# Patient Record
Sex: Female | Born: 1945 | Race: White | Hispanic: No | Marital: Married | Smoking: Never smoker
Health system: Southern US, Community
[De-identification: ages and names within clinical notes are randomized; demographics above are authoritative.]

## PROBLEM LIST (undated history)

## (undated) DIAGNOSIS — I495 Sick sinus syndrome: Secondary | ICD-10-CM

## (undated) DIAGNOSIS — Z9889 Other specified postprocedural states: Secondary | ICD-10-CM

## (undated) DIAGNOSIS — I2510 Atherosclerotic heart disease of native coronary artery without angina pectoris: Secondary | ICD-10-CM

## (undated) DIAGNOSIS — L509 Urticaria, unspecified: Secondary | ICD-10-CM

## (undated) DIAGNOSIS — E042 Nontoxic multinodular goiter: Secondary | ICD-10-CM

## (undated) DIAGNOSIS — I10 Essential (primary) hypertension: Secondary | ICD-10-CM

## (undated) DIAGNOSIS — E119 Type 2 diabetes mellitus without complications: Secondary | ICD-10-CM

## (undated) DIAGNOSIS — I4891 Unspecified atrial fibrillation: Secondary | ICD-10-CM

## (undated) DIAGNOSIS — I4892 Unspecified atrial flutter: Secondary | ICD-10-CM

## (undated) DIAGNOSIS — E785 Hyperlipidemia, unspecified: Secondary | ICD-10-CM

## (undated) DIAGNOSIS — R112 Nausea with vomiting, unspecified: Secondary | ICD-10-CM

## (undated) HISTORY — PX: ROTATOR CUFF REPAIR: SHX139

## (undated) HISTORY — DX: Atherosclerotic heart disease of native coronary artery without angina pectoris: I25.10

## (undated) HISTORY — PX: PACEMAKER PLACEMENT: SHX43

## (undated) HISTORY — DX: Type 2 diabetes mellitus without complications: E11.9

## (undated) HISTORY — DX: Unspecified atrial flutter: I48.92

## (undated) HISTORY — DX: Nontoxic multinodular goiter: E04.2

## (undated) HISTORY — DX: Unspecified atrial fibrillation: I48.91

## (undated) HISTORY — DX: Sick sinus syndrome: I49.5

## (undated) HISTORY — DX: Urticaria, unspecified: L50.9

## (undated) HISTORY — PX: VENTRAL HERNIA REPAIR: SHX424

## (undated) HISTORY — PX: BREAST LUMPECTOMY: SHX2

## (undated) HISTORY — DX: Essential (primary) hypertension: I10

## (undated) HISTORY — DX: Hyperlipidemia, unspecified: E78.5

## (undated) HISTORY — PX: VESICOVAGINAL FISTULA CLOSURE W/ TAH: SUR271

---

## 2000-09-16 ENCOUNTER — Encounter: Payer: Self-pay | Admitting: Family Medicine

## 2000-09-16 ENCOUNTER — Ambulatory Visit (HOSPITAL_COMMUNITY): Admission: RE | Admit: 2000-09-16 | Discharge: 2000-09-16 | Payer: Self-pay | Admitting: Family Medicine

## 2003-11-16 ENCOUNTER — Ambulatory Visit (HOSPITAL_COMMUNITY): Admission: RE | Admit: 2003-11-16 | Discharge: 2003-11-16 | Payer: Self-pay | Admitting: Family Medicine

## 2004-04-28 ENCOUNTER — Emergency Department (HOSPITAL_COMMUNITY): Admission: EM | Admit: 2004-04-28 | Discharge: 2004-04-28 | Payer: Self-pay | Admitting: Emergency Medicine

## 2004-05-09 ENCOUNTER — Ambulatory Visit (HOSPITAL_COMMUNITY): Admission: RE | Admit: 2004-05-09 | Discharge: 2004-05-09 | Payer: Self-pay | Admitting: Orthopaedic Surgery

## 2004-08-15 ENCOUNTER — Ambulatory Visit (HOSPITAL_COMMUNITY): Admission: RE | Admit: 2004-08-15 | Discharge: 2004-08-15 | Payer: Self-pay | Admitting: Orthopaedic Surgery

## 2004-11-26 ENCOUNTER — Ambulatory Visit: Payer: Self-pay | Admitting: Cardiology

## 2004-12-01 ENCOUNTER — Ambulatory Visit: Payer: Self-pay | Admitting: Cardiology

## 2004-12-09 ENCOUNTER — Ambulatory Visit: Payer: Self-pay | Admitting: Cardiology

## 2004-12-10 ENCOUNTER — Ambulatory Visit: Payer: Self-pay | Admitting: Cardiology

## 2004-12-12 ENCOUNTER — Ambulatory Visit: Payer: Self-pay | Admitting: Internal Medicine

## 2004-12-17 ENCOUNTER — Ambulatory Visit: Payer: Self-pay | Admitting: Cardiology

## 2004-12-24 ENCOUNTER — Ambulatory Visit: Payer: Self-pay | Admitting: Cardiology

## 2005-01-01 ENCOUNTER — Ambulatory Visit: Payer: Self-pay | Admitting: Cardiology

## 2005-01-02 ENCOUNTER — Ambulatory Visit: Payer: Self-pay | Admitting: Cardiology

## 2005-01-07 ENCOUNTER — Ambulatory Visit (HOSPITAL_COMMUNITY): Admission: RE | Admit: 2005-01-07 | Discharge: 2005-01-07 | Payer: Self-pay | Admitting: Family Medicine

## 2005-01-12 ENCOUNTER — Ambulatory Visit: Payer: Self-pay | Admitting: Internal Medicine

## 2005-01-13 ENCOUNTER — Inpatient Hospital Stay (HOSPITAL_COMMUNITY): Admission: AD | Admit: 2005-01-13 | Discharge: 2005-01-15 | Payer: Self-pay | Admitting: Internal Medicine

## 2005-01-14 ENCOUNTER — Ambulatory Visit: Payer: Self-pay | Admitting: Internal Medicine

## 2005-01-22 ENCOUNTER — Ambulatory Visit: Payer: Self-pay | Admitting: Cardiology

## 2005-01-23 ENCOUNTER — Ambulatory Visit: Payer: Self-pay | Admitting: Cardiology

## 2005-01-26 ENCOUNTER — Ambulatory Visit: Payer: Self-pay | Admitting: Internal Medicine

## 2005-02-19 ENCOUNTER — Ambulatory Visit: Payer: Self-pay | Admitting: Cardiology

## 2005-02-26 ENCOUNTER — Ambulatory Visit: Payer: Self-pay | Admitting: Cardiology

## 2005-03-05 ENCOUNTER — Ambulatory Visit: Payer: Self-pay | Admitting: Cardiology

## 2005-03-12 ENCOUNTER — Ambulatory Visit: Payer: Self-pay | Admitting: Cardiology

## 2005-03-24 ENCOUNTER — Ambulatory Visit: Payer: Self-pay | Admitting: Cardiology

## 2005-04-06 ENCOUNTER — Ambulatory Visit: Payer: Self-pay | Admitting: Cardiology

## 2005-05-04 ENCOUNTER — Ambulatory Visit: Payer: Self-pay | Admitting: Cardiology

## 2005-06-01 ENCOUNTER — Ambulatory Visit: Payer: Self-pay | Admitting: Cardiology

## 2005-06-08 ENCOUNTER — Ambulatory Visit: Payer: Self-pay | Admitting: Cardiology

## 2005-06-16 ENCOUNTER — Ambulatory Visit: Payer: Self-pay | Admitting: Cardiology

## 2005-07-07 ENCOUNTER — Ambulatory Visit: Payer: Self-pay | Admitting: Cardiology

## 2005-07-23 ENCOUNTER — Ambulatory Visit: Payer: Self-pay | Admitting: Cardiology

## 2005-08-04 ENCOUNTER — Ambulatory Visit: Payer: Self-pay | Admitting: Cardiology

## 2005-08-11 ENCOUNTER — Ambulatory Visit: Payer: Self-pay | Admitting: Cardiology

## 2005-08-25 ENCOUNTER — Ambulatory Visit: Payer: Self-pay | Admitting: Cardiology

## 2005-09-23 ENCOUNTER — Ambulatory Visit: Payer: Self-pay | Admitting: Cardiology

## 2005-10-21 ENCOUNTER — Ambulatory Visit: Payer: Self-pay | Admitting: Cardiology

## 2005-10-27 ENCOUNTER — Ambulatory Visit: Payer: Self-pay | Admitting: Cardiology

## 2005-11-03 ENCOUNTER — Ambulatory Visit: Payer: Self-pay | Admitting: Cardiology

## 2005-12-08 ENCOUNTER — Ambulatory Visit: Payer: Self-pay | Admitting: Cardiology

## 2006-01-05 ENCOUNTER — Ambulatory Visit: Payer: Self-pay | Admitting: Cardiology

## 2006-01-06 ENCOUNTER — Ambulatory Visit: Payer: Self-pay | Admitting: Cardiology

## 2006-01-13 ENCOUNTER — Ambulatory Visit: Payer: Self-pay | Admitting: Cardiology

## 2006-02-03 ENCOUNTER — Ambulatory Visit: Payer: Self-pay | Admitting: Cardiology

## 2006-02-12 ENCOUNTER — Ambulatory Visit: Payer: Self-pay | Admitting: Cardiology

## 2006-02-22 ENCOUNTER — Ambulatory Visit: Payer: Self-pay | Admitting: Cardiology

## 2006-03-12 ENCOUNTER — Ambulatory Visit: Payer: Self-pay | Admitting: Cardiology

## 2006-03-22 ENCOUNTER — Ambulatory Visit: Payer: Self-pay | Admitting: Cardiology

## 2006-04-05 ENCOUNTER — Ambulatory Visit: Payer: Self-pay | Admitting: Cardiology

## 2006-04-15 ENCOUNTER — Ambulatory Visit (HOSPITAL_COMMUNITY): Admission: RE | Admit: 2006-04-15 | Discharge: 2006-04-15 | Payer: Self-pay | Admitting: Family Medicine

## 2006-04-19 ENCOUNTER — Ambulatory Visit: Payer: Self-pay | Admitting: Cardiology

## 2006-05-14 ENCOUNTER — Ambulatory Visit: Payer: Self-pay | Admitting: Cardiology

## 2006-06-11 ENCOUNTER — Ambulatory Visit: Payer: Self-pay | Admitting: Cardiology

## 2006-06-16 ENCOUNTER — Ambulatory Visit: Payer: Self-pay | Admitting: Cardiology

## 2006-06-24 ENCOUNTER — Ambulatory Visit: Payer: Self-pay | Admitting: Cardiology

## 2006-07-08 ENCOUNTER — Ambulatory Visit: Payer: Self-pay | Admitting: Physician Assistant

## 2006-07-16 ENCOUNTER — Ambulatory Visit: Payer: Self-pay | Admitting: Cardiology

## 2006-07-19 ENCOUNTER — Ambulatory Visit: Payer: Self-pay | Admitting: Cardiology

## 2006-07-27 ENCOUNTER — Ambulatory Visit: Payer: Self-pay | Admitting: Cardiology

## 2006-08-26 ENCOUNTER — Ambulatory Visit: Payer: Self-pay | Admitting: Physician Assistant

## 2006-08-30 ENCOUNTER — Ambulatory Visit: Payer: Self-pay | Admitting: Physician Assistant

## 2006-09-13 ENCOUNTER — Ambulatory Visit: Payer: Self-pay | Admitting: Family Medicine

## 2006-10-04 ENCOUNTER — Ambulatory Visit: Payer: Self-pay | Admitting: Cardiology

## 2006-10-20 ENCOUNTER — Ambulatory Visit: Payer: Self-pay | Admitting: Internal Medicine

## 2006-10-20 LAB — CONVERTED CEMR LAB
Basophils Absolute: 0 10*3/uL (ref 0.0–0.1)
Chloride: 106 meq/L (ref 96–112)
Creatinine, Ser: 0.8 mg/dL (ref 0.4–1.2)
Eosinophils Relative: 2 % (ref 0.0–5.0)
GFR calc Af Amer: 93 mL/min
Glucose, Bld: 163 mg/dL — ABNORMAL HIGH (ref 70–99)
INR: 2.4 — ABNORMAL HIGH (ref 0.8–1.0)
Lymphocytes Relative: 19.5 % (ref 12.0–46.0)
MCHC: 35.8 g/dL (ref 30.0–36.0)
Monocytes Relative: 9.4 % (ref 3.0–11.0)
Prothrombin Time: 19.5 s — ABNORMAL HIGH (ref 10.9–13.3)
Sodium: 143 meq/L (ref 135–145)
TSH: 1.67 microintl units/mL (ref 0.35–5.50)
WBC: 7.7 10*3/uL (ref 4.5–10.5)
aPTT: 59.3 s — ABNORMAL HIGH (ref 21.7–29.8)

## 2006-10-28 ENCOUNTER — Inpatient Hospital Stay (HOSPITAL_BASED_OUTPATIENT_CLINIC_OR_DEPARTMENT_OTHER): Admission: RE | Admit: 2006-10-28 | Discharge: 2006-10-28 | Payer: Self-pay | Admitting: Cardiovascular Disease

## 2006-10-28 ENCOUNTER — Ambulatory Visit: Payer: Self-pay | Admitting: Cardiovascular Disease

## 2006-11-01 ENCOUNTER — Inpatient Hospital Stay (HOSPITAL_COMMUNITY): Admission: RE | Admit: 2006-11-01 | Discharge: 2006-11-02 | Payer: Self-pay | Admitting: Cardiovascular Disease

## 2006-11-01 ENCOUNTER — Ambulatory Visit: Payer: Self-pay | Admitting: Cardiovascular Disease

## 2006-11-05 ENCOUNTER — Ambulatory Visit: Payer: Self-pay | Admitting: Cardiology

## 2006-11-14 ENCOUNTER — Ambulatory Visit: Admission: RE | Admit: 2006-11-14 | Discharge: 2006-11-14 | Payer: Self-pay | Admitting: Cardiology

## 2006-11-16 ENCOUNTER — Ambulatory Visit: Payer: Self-pay | Admitting: Cardiology

## 2006-11-26 ENCOUNTER — Ambulatory Visit: Payer: Self-pay | Admitting: Cardiology

## 2006-11-27 ENCOUNTER — Ambulatory Visit: Payer: Self-pay | Admitting: Pulmonary Disease

## 2006-11-29 ENCOUNTER — Ambulatory Visit: Payer: Self-pay | Admitting: Cardiology

## 2006-12-10 ENCOUNTER — Ambulatory Visit: Payer: Self-pay | Admitting: Cardiology

## 2006-12-24 ENCOUNTER — Ambulatory Visit: Payer: Self-pay | Admitting: Cardiology

## 2007-01-12 ENCOUNTER — Ambulatory Visit: Payer: Self-pay | Admitting: Cardiology

## 2007-01-31 ENCOUNTER — Ambulatory Visit: Payer: Self-pay | Admitting: Cardiology

## 2007-02-02 ENCOUNTER — Ambulatory Visit: Payer: Self-pay | Admitting: Cardiology

## 2007-02-07 ENCOUNTER — Ambulatory Visit: Payer: Self-pay | Admitting: Cardiology

## 2007-02-08 ENCOUNTER — Ambulatory Visit: Payer: Self-pay

## 2007-02-08 ENCOUNTER — Ambulatory Visit: Payer: Self-pay | Admitting: Cardiology

## 2007-02-08 ENCOUNTER — Encounter: Payer: Self-pay | Admitting: Cardiology

## 2007-02-08 LAB — CONVERTED CEMR LAB
BUN: 17 mg/dL
CO2: 30 meq/L
Calcium: 9.1 mg/dL
Chloride: 108 meq/L
Cholesterol: 113 mg/dL
Creatinine, Ser: 0.7 mg/dL
GFR calc Af Amer: 109 mL/min
GFR calc non Af Amer: 90 mL/min
Glucose, Bld: 93 mg/dL
HDL: 33.5 mg/dL — ABNORMAL LOW
LDL Cholesterol: 58 mg/dL
Potassium: 3.9 meq/L
Pro B Natriuretic peptide (BNP): 235 pg/mL — ABNORMAL HIGH
Sodium: 144 meq/L
Total CHOL/HDL Ratio: 3.4
Triglycerides: 106 mg/dL
VLDL: 21 mg/dL

## 2007-02-16 ENCOUNTER — Ambulatory Visit: Payer: Self-pay | Admitting: Cardiology

## 2007-03-02 ENCOUNTER — Ambulatory Visit: Payer: Self-pay | Admitting: Cardiology

## 2007-03-16 ENCOUNTER — Ambulatory Visit: Payer: Self-pay | Admitting: Cardiology

## 2007-04-06 ENCOUNTER — Ambulatory Visit: Payer: Self-pay | Admitting: Cardiology

## 2007-04-25 ENCOUNTER — Ambulatory Visit: Payer: Self-pay | Admitting: Cardiology

## 2007-04-28 ENCOUNTER — Ambulatory Visit: Payer: Self-pay | Admitting: Cardiology

## 2007-05-05 ENCOUNTER — Ambulatory Visit: Payer: Self-pay | Admitting: Internal Medicine

## 2007-05-09 ENCOUNTER — Ambulatory Visit: Payer: Self-pay | Admitting: Cardiology

## 2007-05-26 ENCOUNTER — Ambulatory Visit: Payer: Self-pay | Admitting: Cardiology

## 2007-06-16 ENCOUNTER — Ambulatory Visit: Payer: Self-pay | Admitting: Cardiology

## 2007-07-07 ENCOUNTER — Ambulatory Visit: Payer: Self-pay | Admitting: Cardiology

## 2007-07-12 ENCOUNTER — Ambulatory Visit (HOSPITAL_COMMUNITY): Admission: RE | Admit: 2007-07-12 | Discharge: 2007-07-12 | Payer: Self-pay | Admitting: Family Medicine

## 2007-08-01 ENCOUNTER — Ambulatory Visit: Payer: Self-pay | Admitting: Cardiology

## 2007-08-17 ENCOUNTER — Ambulatory Visit: Payer: Self-pay | Admitting: Internal Medicine

## 2007-09-22 ENCOUNTER — Ambulatory Visit: Payer: Self-pay | Admitting: Cardiology

## 2007-10-03 ENCOUNTER — Ambulatory Visit: Payer: Self-pay | Admitting: Cardiology

## 2007-11-09 ENCOUNTER — Ambulatory Visit: Payer: Self-pay | Admitting: Cardiology

## 2007-12-02 ENCOUNTER — Ambulatory Visit: Payer: Self-pay | Admitting: Cardiology

## 2008-01-03 ENCOUNTER — Ambulatory Visit: Payer: Self-pay | Admitting: Cardiology

## 2008-01-25 ENCOUNTER — Ambulatory Visit: Payer: Self-pay | Admitting: Internal Medicine

## 2008-01-31 ENCOUNTER — Ambulatory Visit: Payer: Self-pay | Admitting: Cardiology

## 2008-02-14 ENCOUNTER — Ambulatory Visit: Payer: Self-pay | Admitting: Cardiology

## 2008-02-27 ENCOUNTER — Ambulatory Visit: Payer: Self-pay | Admitting: Internal Medicine

## 2008-03-19 DIAGNOSIS — I10 Essential (primary) hypertension: Secondary | ICD-10-CM

## 2008-03-19 DIAGNOSIS — I4892 Unspecified atrial flutter: Secondary | ICD-10-CM

## 2008-03-19 DIAGNOSIS — I4891 Unspecified atrial fibrillation: Secondary | ICD-10-CM

## 2008-03-19 DIAGNOSIS — E119 Type 2 diabetes mellitus without complications: Secondary | ICD-10-CM

## 2008-03-19 DIAGNOSIS — E785 Hyperlipidemia, unspecified: Secondary | ICD-10-CM | POA: Insufficient documentation

## 2008-03-27 ENCOUNTER — Ambulatory Visit: Payer: Self-pay | Admitting: Cardiology

## 2008-04-10 ENCOUNTER — Ambulatory Visit: Payer: Self-pay | Admitting: Cardiology

## 2008-04-24 ENCOUNTER — Ambulatory Visit: Payer: Self-pay | Admitting: Cardiology

## 2008-05-11 ENCOUNTER — Ambulatory Visit: Payer: Self-pay | Admitting: Cardiology

## 2008-05-29 ENCOUNTER — Ambulatory Visit: Payer: Self-pay | Admitting: Cardiology

## 2008-06-05 ENCOUNTER — Encounter: Payer: Self-pay | Admitting: Internal Medicine

## 2008-06-18 ENCOUNTER — Encounter: Payer: Self-pay | Admitting: Cardiology

## 2008-06-26 ENCOUNTER — Ambulatory Visit: Payer: Self-pay | Admitting: Cardiology

## 2008-07-02 ENCOUNTER — Encounter: Payer: Self-pay | Admitting: Cardiology

## 2008-07-11 ENCOUNTER — Encounter: Payer: Self-pay | Admitting: Cardiology

## 2008-07-13 ENCOUNTER — Ambulatory Visit (HOSPITAL_COMMUNITY): Admission: RE | Admit: 2008-07-13 | Discharge: 2008-07-13 | Payer: Self-pay | Admitting: Family Medicine

## 2008-07-20 ENCOUNTER — Encounter: Payer: Self-pay | Admitting: Cardiology

## 2008-07-24 ENCOUNTER — Ambulatory Visit: Payer: Self-pay | Admitting: Cardiology

## 2008-08-04 ENCOUNTER — Encounter: Payer: Self-pay | Admitting: Cardiology

## 2008-08-21 ENCOUNTER — Ambulatory Visit: Payer: Self-pay | Admitting: Cardiology

## 2008-08-22 ENCOUNTER — Encounter: Payer: Self-pay | Admitting: Physician Assistant

## 2008-08-28 ENCOUNTER — Ambulatory Visit: Payer: Self-pay | Admitting: Cardiology

## 2008-09-18 ENCOUNTER — Ambulatory Visit: Payer: Self-pay | Admitting: Cardiology

## 2008-10-01 ENCOUNTER — Encounter: Payer: Self-pay | Admitting: *Deleted

## 2008-10-09 ENCOUNTER — Ambulatory Visit: Payer: Self-pay | Admitting: Cardiology

## 2008-10-09 LAB — CONVERTED CEMR LAB: Prothrombin Time: 21.6 s

## 2008-10-11 ENCOUNTER — Ambulatory Visit: Payer: Self-pay | Admitting: Cardiology

## 2008-10-11 DIAGNOSIS — R229 Localized swelling, mass and lump, unspecified: Secondary | ICD-10-CM

## 2008-10-17 ENCOUNTER — Ambulatory Visit: Payer: Self-pay | Admitting: Cardiology

## 2008-10-19 ENCOUNTER — Encounter: Payer: Self-pay | Admitting: Cardiology

## 2008-10-20 ENCOUNTER — Ambulatory Visit: Payer: Self-pay | Admitting: Cardiology

## 2008-10-22 ENCOUNTER — Encounter: Payer: Self-pay | Admitting: Cardiology

## 2008-10-23 ENCOUNTER — Encounter: Payer: Self-pay | Admitting: Cardiology

## 2008-10-24 ENCOUNTER — Encounter: Payer: Self-pay | Admitting: Cardiology

## 2008-10-26 ENCOUNTER — Ambulatory Visit: Payer: Self-pay | Admitting: Cardiology

## 2008-10-30 ENCOUNTER — Encounter: Payer: Self-pay | Admitting: Cardiology

## 2008-11-02 ENCOUNTER — Encounter: Payer: Self-pay | Admitting: Cardiology

## 2008-11-02 ENCOUNTER — Telehealth: Payer: Self-pay | Admitting: Physician Assistant

## 2008-11-02 ENCOUNTER — Ambulatory Visit: Payer: Self-pay | Admitting: Cardiology

## 2008-11-02 LAB — CONVERTED CEMR LAB: POC INR: 3.7

## 2008-11-07 ENCOUNTER — Ambulatory Visit: Payer: Self-pay | Admitting: Internal Medicine

## 2008-11-09 ENCOUNTER — Ambulatory Visit: Payer: Self-pay | Admitting: Cardiology

## 2008-11-14 ENCOUNTER — Telehealth: Payer: Self-pay | Admitting: Internal Medicine

## 2008-11-15 ENCOUNTER — Encounter: Payer: Self-pay | Admitting: Cardiology

## 2008-11-19 ENCOUNTER — Telehealth: Payer: Self-pay | Admitting: Internal Medicine

## 2008-11-26 ENCOUNTER — Ambulatory Visit: Payer: Self-pay | Admitting: Cardiology

## 2008-11-27 ENCOUNTER — Ambulatory Visit: Payer: Self-pay | Admitting: Cardiology

## 2008-11-27 ENCOUNTER — Encounter: Payer: Self-pay | Admitting: Physician Assistant

## 2008-12-04 ENCOUNTER — Telehealth: Payer: Self-pay | Admitting: Physician Assistant

## 2008-12-04 ENCOUNTER — Encounter (INDEPENDENT_AMBULATORY_CARE_PROVIDER_SITE_OTHER): Payer: Self-pay | Admitting: *Deleted

## 2008-12-18 ENCOUNTER — Ambulatory Visit: Payer: Self-pay | Admitting: Cardiology

## 2008-12-18 LAB — CONVERTED CEMR LAB: POC INR: 2.1

## 2008-12-31 ENCOUNTER — Ambulatory Visit: Payer: Self-pay | Admitting: Internal Medicine

## 2009-01-01 ENCOUNTER — Encounter: Payer: Self-pay | Admitting: Cardiology

## 2009-01-15 ENCOUNTER — Ambulatory Visit: Payer: Self-pay | Admitting: Cardiology

## 2009-01-15 LAB — CONVERTED CEMR LAB: POC INR: 2.7

## 2009-01-29 ENCOUNTER — Ambulatory Visit: Payer: Self-pay | Admitting: Cardiology

## 2009-01-29 LAB — CONVERTED CEMR LAB: POC INR: 1.5

## 2009-02-12 ENCOUNTER — Ambulatory Visit: Payer: Self-pay | Admitting: Cardiology

## 2009-03-12 ENCOUNTER — Ambulatory Visit: Payer: Self-pay | Admitting: Cardiology

## 2009-03-14 ENCOUNTER — Encounter: Payer: Self-pay | Admitting: Cardiology

## 2009-03-15 ENCOUNTER — Encounter: Payer: Self-pay | Admitting: Cardiology

## 2009-03-15 ENCOUNTER — Emergency Department (HOSPITAL_COMMUNITY): Admission: EM | Admit: 2009-03-15 | Discharge: 2009-03-15 | Payer: Self-pay | Admitting: Family Medicine

## 2009-03-17 ENCOUNTER — Ambulatory Visit: Payer: Self-pay | Admitting: Cardiology

## 2009-03-17 ENCOUNTER — Inpatient Hospital Stay (HOSPITAL_COMMUNITY): Admission: EM | Admit: 2009-03-17 | Discharge: 2009-03-22 | Payer: Self-pay | Admitting: Emergency Medicine

## 2009-03-17 ENCOUNTER — Ambulatory Visit: Payer: Self-pay | Admitting: Internal Medicine

## 2009-03-18 ENCOUNTER — Encounter (INDEPENDENT_AMBULATORY_CARE_PROVIDER_SITE_OTHER): Payer: Self-pay | Admitting: Internal Medicine

## 2009-03-19 ENCOUNTER — Encounter: Payer: Self-pay | Admitting: Cardiology

## 2009-03-20 ENCOUNTER — Encounter (INDEPENDENT_AMBULATORY_CARE_PROVIDER_SITE_OTHER): Payer: Self-pay | Admitting: Internal Medicine

## 2009-03-20 ENCOUNTER — Encounter: Payer: Self-pay | Admitting: Cardiology

## 2009-03-21 ENCOUNTER — Encounter: Payer: Self-pay | Admitting: Cardiology

## 2009-03-22 ENCOUNTER — Encounter: Payer: Self-pay | Admitting: Cardiology

## 2009-03-26 ENCOUNTER — Ambulatory Visit: Payer: Self-pay | Admitting: Cardiology

## 2009-03-26 DIAGNOSIS — R930 Abnormal findings on diagnostic imaging of skull and head, not elsewhere classified: Secondary | ICD-10-CM | POA: Insufficient documentation

## 2009-03-26 DIAGNOSIS — E049 Nontoxic goiter, unspecified: Secondary | ICD-10-CM | POA: Insufficient documentation

## 2009-03-29 ENCOUNTER — Encounter: Payer: Self-pay | Admitting: Cardiology

## 2009-04-02 ENCOUNTER — Telehealth (INDEPENDENT_AMBULATORY_CARE_PROVIDER_SITE_OTHER): Payer: Self-pay | Admitting: *Deleted

## 2009-04-04 ENCOUNTER — Encounter (INDEPENDENT_AMBULATORY_CARE_PROVIDER_SITE_OTHER): Payer: Self-pay | Admitting: *Deleted

## 2009-04-04 ENCOUNTER — Ambulatory Visit: Payer: Self-pay | Admitting: Internal Medicine

## 2009-04-05 ENCOUNTER — Ambulatory Visit: Payer: Self-pay | Admitting: Cardiology

## 2009-04-16 ENCOUNTER — Telehealth (INDEPENDENT_AMBULATORY_CARE_PROVIDER_SITE_OTHER): Payer: Self-pay | Admitting: *Deleted

## 2009-04-23 ENCOUNTER — Ambulatory Visit: Payer: Self-pay | Admitting: Cardiology

## 2009-04-29 ENCOUNTER — Encounter: Payer: Self-pay | Admitting: Cardiology

## 2009-05-01 ENCOUNTER — Ambulatory Visit: Payer: Self-pay | Admitting: Internal Medicine

## 2009-05-06 ENCOUNTER — Encounter: Payer: Self-pay | Admitting: Cardiology

## 2009-05-20 ENCOUNTER — Telehealth (INDEPENDENT_AMBULATORY_CARE_PROVIDER_SITE_OTHER): Payer: Self-pay | Admitting: *Deleted

## 2009-05-21 ENCOUNTER — Ambulatory Visit: Payer: Self-pay | Admitting: Cardiology

## 2009-05-21 LAB — CONVERTED CEMR LAB: POC INR: 1.9

## 2009-05-24 ENCOUNTER — Encounter: Payer: Self-pay | Admitting: Internal Medicine

## 2009-05-29 ENCOUNTER — Ambulatory Visit: Payer: Self-pay | Admitting: Internal Medicine

## 2009-05-29 ENCOUNTER — Telehealth: Payer: Self-pay | Admitting: Internal Medicine

## 2009-05-29 DIAGNOSIS — R55 Syncope and collapse: Secondary | ICD-10-CM | POA: Insufficient documentation

## 2009-05-30 ENCOUNTER — Telehealth: Payer: Self-pay | Admitting: Internal Medicine

## 2009-05-31 ENCOUNTER — Ambulatory Visit (HOSPITAL_COMMUNITY): Admission: RE | Admit: 2009-05-31 | Discharge: 2009-05-31 | Payer: Self-pay | Admitting: Internal Medicine

## 2009-05-31 ENCOUNTER — Ambulatory Visit: Payer: Self-pay | Admitting: Internal Medicine

## 2009-06-13 ENCOUNTER — Telehealth (INDEPENDENT_AMBULATORY_CARE_PROVIDER_SITE_OTHER): Payer: Self-pay | Admitting: *Deleted

## 2009-06-14 ENCOUNTER — Ambulatory Visit: Payer: Self-pay | Admitting: Internal Medicine

## 2009-06-14 ENCOUNTER — Ambulatory Visit: Payer: Self-pay | Admitting: Cardiology

## 2009-06-14 LAB — CONVERTED CEMR LAB: POC INR: 2.7

## 2009-06-16 ENCOUNTER — Encounter: Payer: Self-pay | Admitting: Internal Medicine

## 2009-06-19 ENCOUNTER — Telehealth: Payer: Self-pay | Admitting: Internal Medicine

## 2009-06-20 ENCOUNTER — Encounter: Payer: Self-pay | Admitting: Internal Medicine

## 2009-06-24 ENCOUNTER — Encounter: Payer: Self-pay | Admitting: Internal Medicine

## 2009-06-25 ENCOUNTER — Ambulatory Visit: Payer: Self-pay | Admitting: Internal Medicine

## 2009-06-25 ENCOUNTER — Ambulatory Visit (HOSPITAL_COMMUNITY): Admission: RE | Admit: 2009-06-25 | Discharge: 2009-06-26 | Payer: Self-pay | Admitting: Internal Medicine

## 2009-06-26 ENCOUNTER — Encounter: Payer: Self-pay | Admitting: Internal Medicine

## 2009-07-02 ENCOUNTER — Encounter: Payer: Self-pay | Admitting: Internal Medicine

## 2009-07-05 ENCOUNTER — Ambulatory Visit: Payer: Self-pay | Admitting: Cardiology

## 2009-07-05 ENCOUNTER — Ambulatory Visit: Payer: Self-pay | Admitting: Internal Medicine

## 2009-07-05 LAB — CONVERTED CEMR LAB: POC INR: 3

## 2009-07-09 ENCOUNTER — Encounter (INDEPENDENT_AMBULATORY_CARE_PROVIDER_SITE_OTHER): Payer: Self-pay | Admitting: *Deleted

## 2009-07-11 ENCOUNTER — Telehealth: Payer: Self-pay | Admitting: Internal Medicine

## 2009-07-16 ENCOUNTER — Encounter: Payer: Self-pay | Admitting: Cardiology

## 2009-07-16 ENCOUNTER — Telehealth: Payer: Self-pay | Admitting: Internal Medicine

## 2009-07-17 ENCOUNTER — Ambulatory Visit: Payer: Self-pay | Admitting: Internal Medicine

## 2009-07-30 ENCOUNTER — Ambulatory Visit: Payer: Self-pay | Admitting: Cardiology

## 2009-07-30 LAB — CONVERTED CEMR LAB: POC INR: 3.1

## 2009-08-09 ENCOUNTER — Telehealth (INDEPENDENT_AMBULATORY_CARE_PROVIDER_SITE_OTHER): Payer: Self-pay | Admitting: *Deleted

## 2009-08-26 ENCOUNTER — Encounter: Payer: Self-pay | Admitting: Cardiology

## 2009-08-26 ENCOUNTER — Ambulatory Visit: Payer: Self-pay | Admitting: Physician Assistant

## 2009-08-26 DIAGNOSIS — I495 Sick sinus syndrome: Secondary | ICD-10-CM

## 2009-08-26 DIAGNOSIS — R609 Edema, unspecified: Secondary | ICD-10-CM

## 2009-08-27 ENCOUNTER — Encounter: Payer: Self-pay | Admitting: Cardiology

## 2009-08-28 ENCOUNTER — Encounter (INDEPENDENT_AMBULATORY_CARE_PROVIDER_SITE_OTHER): Payer: Self-pay | Admitting: *Deleted

## 2009-09-03 ENCOUNTER — Encounter: Payer: Self-pay | Admitting: Physician Assistant

## 2009-09-05 ENCOUNTER — Encounter (INDEPENDENT_AMBULATORY_CARE_PROVIDER_SITE_OTHER): Payer: Self-pay | Admitting: *Deleted

## 2009-09-24 ENCOUNTER — Ambulatory Visit: Payer: Self-pay | Admitting: Cardiology

## 2009-09-24 LAB — CONVERTED CEMR LAB: POC INR: 2.9

## 2009-09-27 ENCOUNTER — Ambulatory Visit: Payer: Self-pay | Admitting: Internal Medicine

## 2009-10-22 ENCOUNTER — Ambulatory Visit: Payer: Self-pay | Admitting: Cardiology

## 2009-11-06 ENCOUNTER — Ambulatory Visit: Payer: Self-pay | Admitting: Cardiology

## 2009-11-19 ENCOUNTER — Ambulatory Visit: Payer: Self-pay | Admitting: Cardiology

## 2009-11-19 LAB — CONVERTED CEMR LAB: POC INR: 3

## 2009-11-20 ENCOUNTER — Ambulatory Visit: Payer: Self-pay | Admitting: Cardiology

## 2009-11-22 ENCOUNTER — Encounter (INDEPENDENT_AMBULATORY_CARE_PROVIDER_SITE_OTHER): Payer: Self-pay | Admitting: *Deleted

## 2009-12-11 ENCOUNTER — Telehealth: Payer: Self-pay | Admitting: Cardiology

## 2009-12-20 ENCOUNTER — Ambulatory Visit: Payer: Self-pay | Admitting: Cardiology

## 2009-12-26 ENCOUNTER — Ambulatory Visit (HOSPITAL_COMMUNITY): Admission: RE | Admit: 2009-12-26 | Payer: Self-pay | Admitting: Family Medicine

## 2010-01-21 ENCOUNTER — Ambulatory Visit: Payer: Self-pay | Admitting: Cardiology

## 2010-02-18 ENCOUNTER — Ambulatory Visit: Admission: RE | Admit: 2010-02-18 | Discharge: 2010-02-18 | Payer: Self-pay | Source: Home / Self Care

## 2010-02-18 LAB — CONVERTED CEMR LAB: POC INR: 2.4

## 2010-03-08 ENCOUNTER — Encounter: Payer: Self-pay | Admitting: Family Medicine

## 2010-03-16 LAB — CONVERTED CEMR LAB
BUN: 15 mg/dL (ref 6–23)
CO2: 30 meq/L (ref 19–32)
Chloride: 107 meq/L (ref 96–112)
Eosinophils Absolute: 0.2 10*3/uL (ref 0.0–0.7)
GFR calc non Af Amer: 88.91 mL/min (ref >60)
Glucose, Bld: 71 mg/dL (ref 70–99)
HCT: 39.9 % (ref 36.0–46.0)
INR: 2.4 — ABNORMAL HIGH (ref 0.8–1.0)
Lymphocytes Relative: 22 % (ref 12.0–46.0)
Lymphs Abs: 1.9 10*3/uL (ref 0.7–4.0)
MCHC: 34.5 g/dL (ref 30.0–36.0)
MCV: 88.6 fL (ref 78.0–100.0)
Monocytes Absolute: 0.6 10*3/uL (ref 0.1–1.0)
Neutrophils Relative %: 68 % (ref 43.0–77.0)
Platelets: 229 10*3/uL (ref 150.0–400.0)
Potassium: 4.3 meq/L (ref 3.5–5.1)
RDW: 14.2 % (ref 11.5–14.6)
Sodium: 142 meq/L (ref 135–145)
WBC: 8.7 10*3/uL (ref 4.5–10.5)

## 2010-03-17 ENCOUNTER — Telehealth (INDEPENDENT_AMBULATORY_CARE_PROVIDER_SITE_OTHER): Payer: Self-pay | Admitting: *Deleted

## 2010-03-18 ENCOUNTER — Ambulatory Visit: Admit: 2010-03-18 | Payer: Self-pay

## 2010-03-18 NOTE — Assessment & Plan Note (Signed)
Summary: 1 MO F/U PER 7/11 OV-JM   Visit Type:  Follow-up Primary Provider:  Cato Mulligan, MD   History of Present Illness: the patient is a 65 year old female with a history of atrial fibrillation status post ulnar vein isolation at [facility]. She has known coronary artery disease and is status post bare-metal stent of the circumflex in 2008. She status post pacemaker implantation. She received a pacemaker for tachybradycardia syndrome. Her atrial relation is controlled with dofetilide. She is also on Coumadin.  The patient states that after she received a pacemaker she has noticed significant swelling in the lower extremities. This has not been responsive to diuretic therapy. On physical examination she has no definite tricuspid regurgitation. She was given Lasix without any significant improvement in her symptoms. 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  Preventive Screening-Counseling & Management  Alcohol-Tobacco     Smoking Status: never  Current Medications (verified): 1)  Aspirin 81 Mg Tbec (Aspirin) .... Take One Tablet By Mouth Daily 2)  Glipizide 10 Mg Tabs (Glipizide) .Marland Kitchen.. 1 Tablet in The Am 1/2 Tablet in Thepm 3)  Diltiazem Hcl Er Beads 360 Mg Xr24h-Cap (Diltiazem Hcl Er Beads) .... Take One Capsule By Mouth Daily 4)  Potassium Chloride Crys Cr 20 Meq Cr-Tabs (Potassium Chloride Crys Cr) .... Take One Tablet By Mouth Twice Daily 5)  Atenolol 50 Mg Tabs (Atenolol) .... Take 1 Tablet By Mouth Every Morning 6)  Pravastatin Sodium 40 Mg Tabs (Pravastatin Sodium) .... Take 1 Tablet By Mouth Every Night 7)  Tikosyn 500 Mcg Caps (Dofetilide) .... Take 1 Tablet By Mouth Two Times A Day 8)  Coumadin 5 Mg Tabs (Warfarin Sodium) .... Take By Mouth As Directed By Anticoagulation Clinic 9)  Bumetanide 2 Mg Tabs (Bumetanide) .... Take 2 Tablets Every Morning and 1 Tablet Every Evening. 10)  Onglyza 5 Mg Tabs (Saxagliptin Hcl) .... Once Daily 11)  Nitrostat 0.4 Mg Subl  (Nitroglycerin) .Marland Kitchen.. 1 Tablet Under Tongue At Onset of Chest Pain; You May Repeat Every 5 Minutes For Up To 3 Doses. 12)  Lortab 5-500 Mg Tabs (Hydrocodone-Acetaminophen) .... Take One Every 4-6 Hours As Needed Pain 13)  Bilateral Knee High Compression Stockings - Low Pressure .... Use As Directed  Allergies (verified): 1)  Codeine 2)  Metformin Hcl  Comments:  Nurse/Medical Assistant: The patient's medication list and allergies were reviewed with the patient and were updated in the Medication and Allergy Lists.  Past History:  Past Medical History: Last updated: 07/17/2009  1. Atrial fibrillation  2. Atrial flutter previously documented on EKG without flutter ablation previously.   3. Coronary artery disease status post bare-metal stent of the left circumflex artery in September 2008.   4. Preserved ejection fraction.   5. Type 2 diabetes mellitus.   6. Hyperlipidemia.   7. Thyroid nodules per pt  8. Recurrent syncope  9. s/p PPM for tachybrady syndrome and syncope  Past Surgical History: Last updated: 07/17/2009 Ventral hernia repair Rotator Cuff Hysterectomy Lumpectomy S/p PPM for syncope/ tachycardia bradycardia syndrome  Family History: Last updated: 03/19/2008 Unremarkable  Social History: Last updated: 11/07/2008 Pt lives in [place] with spouse.   Retired from [employer]. Tobacco Use - No.  Alcohol Use - no  Risk Factors: Smoking Status: never (11/06/2009)  Review of Systems       The patient complains of leg swelling.  The patient denies fatigue, malaise, fever, weight gain/loss, vision loss, decreased hearing, hoarseness, chest pain, palpitations, shortness of breath, prolonged  cough, wheezing, sleep apnea, coughing up blood, abdominal pain, blood in stool, nausea, vomiting, diarrhea, heartburn, incontinence, blood in urine, muscle weakness, joint pain, rash, skin lesions, headache, fainting, dizziness, depression, anxiety, enlarged lymph nodes, easy bruising  or bleeding, and environmental allergies.    Vital Signs:  Patient profile:   65 year old female Height:      63 inches Weight:      186 pounds Pulse rate:   77 / minute BP sitting:   125 / 78  (left arm) Cuff size:   large  Vitals Entered By: Carlye Grippe (November 06, 2009 8:48 AM)  Physical Exam  Additional Exam:  General: Well-developed, well-nourished in no distress head: Normocephalic and atraumatic eyes PERRLA/EOMI intact, conjunctiva and lids normal nose: No deformity or lesions mouth normal dentition, normal posterior pharynx neck: Supple, no JVD.  No masses, thyromegaly or abnormal cervical nodes lungs: Normal breath sounds bilaterally without wheezing.  Normal percussion heart: regular rate and rhythm with normal S1 and S2, no S3 or S4.  PMI is normal.  No pathological murmurs consistent with tricuspid regurgitation. No Carvalho sign abdomen: Normal bowel sounds, abdomen is soft and nontender without masses, organomegaly or hernias noted.  No hepatosplenomegaly musculoskeletal: Back normal, normal gait muscle strength and tone normal pulsus: Pulse is normal in all 4 extremities Extremities: No peripheral pitting edema neurologic: Alert and oriented x 3 skin: Intact without lesions or rashes cervical nodes: No significant adenopathy psychologic: Normal affect    PPM Specifications Following MD:  Hillis Range, MD     PPM Vendor:  [employer]     PPM Model Number:  [id number]     PPM Serial Number:  [id number] PPM DOI:  06/25/2009     PPM Implanting MD:  Hillis Range, MD  Lead 1    Location: RA     DOI: 06/25/2009     Model #: [id number]     Serial #: [id number]     Status: active Lead 2    Location: RV     DOI: 06/25/2009     Model #: [id number]     Serial #: [id number]     Status: active  Magnet Response Rate:  BOL 85 ERI 65  Indications:  Tachy-brady syndrome   PPM Follow Up Pacer Dependent:  No      Episodes Coumadin:  Yes  Parameters Mode:  DDDR+     Lower Rate  Limit:  60     Upper Rate Limit:  130 Paced AV Delay:  250     Sensed AV Delay:  220  ILR Following MD Hillis Range, MD    Impression & Recommendations:  Problem # 1:  BRADYCARDIA-TACHYCARDIA SYNDROME (ICD-427.81) status post pacemaker. AV sequential pacing by EKG Her updated medication list for this problem includes:    Aspirin 81 Mg Tbec (Aspirin) .Marland Kitchen... Take one tablet by mouth daily    Diltiazem Hcl Er Beads 360 Mg Xr24h-cap (Diltiazem hcl er beads) .Marland Kitchen... Take one capsule by mouth daily    Atenolol 50 Mg Tabs (Atenolol) .Marland Kitchen... Take 1 tablet by mouth every morning    Tikosyn 500 Mcg Caps (Dofetilide) .Marland Kitchen... Take 1 tablet by mouth two times a day    Coumadin 5 Mg Tabs (Warfarin sodium) .Marland Kitchen... Take by mouth as directed by anticoagulation clinic    Nitrostat 0.4 Mg Subl (Nitroglycerin) .Marland Kitchen... 1 tablet under tongue at onset of chest pain; you may repeat every 5 minutes for up to 3 doses.  Problem # 2:  PERIPHERAL EDEMA (ICD-782.3) the patient may have tricuspid regurgitation after pacemaker implantation. We will check an echocardiogram to make sure she does not have a mechanical complication of her tricuspid valve.I discontinued Lasix and she has no evidence of volume overload, if anythingvenous insufficiency. I also don't want the patient to be hypokalemic with the use of dofetilide  Problem # 3:  ATRIAL FIBRILLATION (ICD-427.31) followup Dr. Johney Frame. Continue dofetilide Her updated medication list for this problem includes:    Aspirin 81 Mg Tbec (Aspirin) .Marland Kitchen... Take one tablet by mouth daily    Atenolol 50 Mg Tabs (Atenolol) .Marland Kitchen... Take 1 tablet by mouth every morning    Tikosyn 500 Mcg Caps (Dofetilide) .Marland Kitchen... Take 1 tablet by mouth two times a day    Coumadin 5 Mg Tabs (Warfarin sodium) .Marland Kitchen... Take by mouth as directed by anticoagulation clinic  Orders: 2-D Echocardiogram (2D Echo)  Problem # 4:  HYPERTENSION, UNSPECIFIED (ICD-401.9) blood pressure is well controlled. Continue medical  therapy. The following medications were removed from the medication list:    Furosemide 40 Mg Tabs (Furosemide) .Marland Kitchen... Take 1 twice daily Her updated medication list for this problem includes:    Aspirin 81 Mg Tbec (Aspirin) .Marland Kitchen... Take one tablet by mouth daily    Diltiazem Hcl Er Beads 360 Mg Xr24h-cap (Diltiazem hcl er beads) .Marland Kitchen... Take one capsule by mouth daily    Atenolol 50 Mg Tabs (Atenolol) .Marland Kitchen... Take 1 tablet by mouth every morning    Bumetanide 2 Mg Tabs (Bumetanide) .Marland Kitchen... Take 2 tablets every morning and 1 tablet every evening.  Other Orders: EKG w/ Interpretation (93000)  Patient Instructions: 1)  Compression stockings, bilateral knee high, low pressure 2)  2D Echo  3)  Continue Coumadin for now 4)  Stop Lasix 5)  Follow up in 6 months Prescriptions: BILATERAL KNEE HIGH COMPRESSION STOCKINGS - LOW PRESSURE use as directed  #1 x 1   Entered by:   Hoover Brunette, LPN   Authorized by:   Lewayne Bunting, MD, [facility]   Signed by:   Hoover Brunette, LPN on 84/13/2440   Method used:   Print then Give to Patient   RxID:   [id number]

## 2010-03-18 NOTE — Cardiovascular Report (Signed)
Summary: [facility]/ INTERROGATION REPORT  Card Device Clinic/ INTERROGATION REPORT   Imported By: Dorise Hiss 10/01/2009 16:09:45  _____________________________________________________________________  External Attachment:    Type:   Image     Comment:   External Document

## 2010-03-18 NOTE — Cardiovascular Report (Signed)
Summary: Pre Op Orders  Pre Op Orders   Imported By: Roderic Ovens 07/04/2009 11:43:59  _____________________________________________________________________  External Attachment:    Type:   Image     Comment:   External Document

## 2010-03-18 NOTE — Medication Information (Signed)
Summary: ccr-lr  Anticoagulant Therapy  Managed by: Vashti Hey, RN PCP: Cato Mulligan, MD Supervising MD: Antoine Poche MD, Fayrene Fearing Indication 1: Atrial Fibrillation (ICD-427.31) [facility]  Site: [place] INR POC 2.8  Dietary changes: no    Health status changes: no    Bleeding/hemorrhagic complications: no    Recent/future hospitalizations: no    Any changes in medication regimen? no    Recent/future dental: no  Any missed doses?: yes     Details: missed last nights coumadin dose  Is patient compliant with meds? yes       Allergies: 1)  Codeine 2)  Metformin Hcl  Anticoagulation Management History:      The patient is taking warfarin and comes in today for a routine follow up visit.  Positive risk factors for bleeding include an age of 67 years or older and presence of serious comorbidities.  The bleeding index is 'intermediate risk'.  Positive CHADS2 values include History of HTN and History of Diabetes.  Negative CHADS2 values include Age > 19 years old.  The start date was 12/01/2004.  Her last INR was 2.4 ratio.  Anticoagulation responsible provider: Antoine Poche MD, Fayrene Fearing.  INR POC: 2.8.  Cuvette Lot#: [id number].  Exp: 10/11.    Anticoagulation Management Assessment/Plan:      The patient's current anticoagulation dose is Coumadin 5 mg tabs: Take by mouth as directed by Anticoagulation Clinic.  The target INR is 2 - 3.  The next INR is due 02/18/2010.  Anticoagulation instructions were given to patient.  Results were reviewed/authorized by Vashti Hey, RN.  She was notified by Vashti Hey RN.         Prior Anticoagulation Instructions: INR 2.9 Continue coumadin 2.5mg  once daily except 5mg  on Mondays, Wednesdays and Fridays  Current Anticoagulation Instructions: INR 2.8 Continue couamdin 2.5mg  once daily except 5mg  on M,W,F

## 2010-03-18 NOTE — Medication Information (Signed)
Summary: Coumadin Clinic  Anticoagulant Therapy  Managed by: [name] Hey, [name] [name] PCP: Cato Mulligan, MD Supervising MD: Andee Lineman MD, Michelle Piper Indication 1: Atrial Fibrillation (ICD-427.31) Lab [facility] [place] Site: [place] INR POC 2.6  Dietary changes: no    Health status changes: no    Bleeding/hemorrhagic complications: no    Recent/future hospitalizations: no    Any changes in medication regimen? no    Recent/future dental: no  Any missed doses?: no       Is patient compliant with meds? yes       Allergies: 1)  Codeine 2)  Metformin Hcl  Anticoagulation Management History:      The patient is taking warfarin and comes in today for a routine follow up visit.  Positive risk factors for bleeding include an age of 65 years or older and presence of serious comorbidities.  The bleeding index is 'intermediate risk'.  Positive CHADS2 values include History of HTN and History of Diabetes.  Negative CHADS2 values include Age > 39 years old.  The start date was 12/01/2004.  Her last INR was 2.4 ratio.  Anticoagulation responsible provider: Andee Lineman MD, Michelle Piper.  INR POC: 2.6.  Cuvette Lot#: [id number].  Exp: 10/11.    Anticoagulation Management Assessment/Plan:      The patient's current anticoagulation dose is Coumadin 5 mg tabs: Take by mouth as directed by Anticoagulation Clinic.  The target INR is 2 - 3.  The next INR is due 09/24/2009.  Anticoagulation instructions were given to patient.  Results were reviewed/authorized by [name] Hey, [name] [name].  She was notified by [name] Hey [name] [name].         Prior Anticoagulation Instructions: INR 3.1 Hold coumadin tonight then resume 2.5mg  once daily except 5mg  on M,W,F  Current Anticoagulation Instructions: INR 2.6 Continue coumadin 2.5mg  once daily except 5mg  on M,W,F

## 2010-03-18 NOTE — Medication Information (Signed)
Summary: ccr  Anticoagulant Therapy  Managed by: Vashti Hey, RN PCP: Cato Mulligan, MD Supervising MD: Andee Lineman MD, Michelle Piper Indication 1: Atrial Fibrillation (ICD-427.31) [facility] [place] Site: [place] INR POC 2.9  Dietary changes: no    Health status changes: yes       Details: vertigo  Bleeding/hemorrhagic complications: no    Recent/future hospitalizations: no    Any changes in medication regimen? yes       Details: meclazine 25mg  tid  Recent/future dental: no  Any missed doses?: no       Is patient compliant with meds? yes       Allergies: 1)  Codeine 2)  Metformin Hcl  Anticoagulation Management History:      The patient is taking warfarin and comes in today for a routine follow up visit.  Positive risk factors for bleeding include an age of 20 years or older and presence of serious comorbidities.  The bleeding index is 'intermediate risk'.  Positive CHADS2 values include History of HTN and History of Diabetes.  Negative CHADS2 values include Age > 12 years old.  The start date was 12/01/2004.  Her last INR was 2.4 ratio.  Anticoagulation responsible provider: Andee Lineman MD, Michelle Piper.  INR POC: 2.9.  Cuvette Lot#: [id number].  Exp: 10/11.    Anticoagulation Management Assessment/Plan:      The patient's current anticoagulation dose is Coumadin 5 mg tabs: Take by mouth as directed by Anticoagulation Clinic.  The target INR is 2 - 3.  The next INR is due 01/21/2010.  Anticoagulation instructions were given to patient.  Results were reviewed/authorized by Vashti Hey, RN.  She was notified by Vashti Hey RN.         Prior Anticoagulation Instructions: INR 3.0 Continue coumadin 2.5mg  once daily except 5mg  on M,W,F  Current Anticoagulation Instructions: INR 2.9 Continue coumadin 2.5mg  once daily except 5mg  on Mondays, Wednesdays and Fridays

## 2010-03-18 NOTE — Procedures (Signed)
Summary: wound check/amber   Current Medications (verified): 1)  Aspirin 81 Mg Tbec (Aspirin) .... Take One Tablet By Mouth Daily 2)  Glipizide 10 Mg Tabs (Glipizide) .Marland Kitchen.. 1 Tablet in The Am 1/2 Tablet in Thepm 3)  Diltiazem Hcl Er Beads 360 Mg Xr24h-Cap (Diltiazem Hcl Er Beads) .... Take One Capsule By Mouth Daily 4)  Potassium Chloride Crys Cr 20 Meq Cr-Tabs (Potassium Chloride Crys Cr) .... Take One Tablet By Mouth Twice Daily 5)  Atenolol 50 Mg Tabs (Atenolol) .... Take 1/2 Tablet By Mouth Every Morning 6)  Pravastatin Sodium 40 Mg Tabs (Pravastatin Sodium) .... Take 1 Tablet By Mouth Every Night 7)  Tikosyn 500 Mcg Caps (Dofetilide) .... Take 1 Tablet By Mouth Two Times A Day 8)  Coumadin 5 Mg Tabs (Warfarin Sodium) .... Take By Mouth As Directed By Anticoagulation Clinic 9)  Bumetanide 2 Mg Tabs (Bumetanide) .... Take 1 Tablet By Mouth Two Times A Day 10)  Onglyza 5 Mg Tabs (Saxagliptin Hcl) .... Once Daily 11)  Losartan Potassium 25 Mg Tabs (Losartan Potassium) .... Take 1 Tablet By Mouth Once A Day  Allergies: 1)  Codeine 2)  Metformin Hcl  PPM Specifications Following MD:  Hillis Range, MD     PPM Vendor:  [employer]     PPM Model Number:  ADDR01     PPM Serial Number:  [id number] PPM DOI:  06/25/2009     PPM Implanting MD:  Hillis Range, MD  Lead 1    Location: RA     DOI: 06/25/2009     Model #: [id number]     Serial #: [id number]     Status: active Lead 2    Location: RV     DOI: 06/25/2009     Model #: [id number]     Serial #: [id number]     Status: active  Magnet Response Rate:  BOL 85 ERI 65  Indications:  Tachy-brady syndrome   PPM Follow Up Remote Check?  No Battery Voltage:  2.8 V     Battery Est. Longevity:  10 years     Pacer Dependent:  No       PPM Device Measurements Atrium  Amplitude: 2.0 mV, Impedance: 601 ohms, Threshold: 0.625 V at 0.4 msec Right Ventricle  Amplitude: 15.68 mV, Impedance: 748 ohms, Threshold: 0.5 V at 0.4 msec  Episodes MS Episodes:  692      Percent Mode Switch:  3.1%     Coumadin:  Yes Ventricular High Rate:  0     Atrial Pacing:  85.4%     Ventricular Pacing:  1.6%  Parameters Mode:  DDDR+     Lower Rate Limit:  60     Upper Rate Limit:  130 Paced AV Delay:  250     Sensed AV Delay:  220 Next Cardiology Appt Due:  09/16/2009 Tech Comments:  No parameter changes.  Steri strips removed.  Resolving bruising noted.  Longest mode switch 9:08 minutes, + coumadin.  50% ventricular rates during a-fib > 100bpm.  Activity restrictions reviewed with the patient.  ROV 3 months with Dr. Johney Frame in [place]. Altha Harm, LPN  Jul 05, 2009 9:47 AM   ILR Following MD Hillis Range, MD    Appended Document: wound check/amber Please increase atenolol to 50mg  daily.  Appended Document: wound check/amber pt aware and will increase Atenolol to 50mg  daily

## 2010-03-18 NOTE — Progress Notes (Signed)
Summary: Change CCR appt  Phone Note Call from Patient Call back at Home Phone [phone]   Caller: [facility] Reason for Call: Talk to Nurse Summary of Call: Would like to have CCR checked tomorrow along with her appt w/ [facility].   Initial call taken by: Claudette Laws,  June 13, 2009 1:41 PM  Follow-up for Phone Call        Left message on machine. Appt made for tomorrow. Follow-up by: Vashti Hey RN,  June 13, 2009 2:31 PM

## 2010-03-18 NOTE — Medication Information (Signed)
Summary: CCR-LAST CHECK 08/26/09-JM  Anticoagulant Therapy  Managed by: Vashti Hey, RN PCP: Cato Mulligan, MD Supervising MD: Antoine Poche MD, Fayrene Fearing Indication 1: Atrial Fibrillation (ICD-427.31) Lab [facility] [place] Site: [place] INR POC 2.9  Dietary changes: no    Health status changes: no    Bleeding/hemorrhagic complications: no    Recent/future hospitalizations: no    Any changes in medication regimen? yes       Details: stopped losartan  Recent/future dental: no  Any missed doses?: no       Is patient compliant with meds? yes       Allergies: 1)  Codeine 2)  Metformin Hcl  Anticoagulation Management History:      The patient is taking warfarin and comes in today for a routine follow up visit.  Positive risk factors for bleeding include an age of 50 years or older and presence of serious comorbidities.  The bleeding index is 'intermediate risk'.  Positive CHADS2 values include History of HTN and History of Diabetes.  Negative CHADS2 values include Age > 32 years old.  The start date was 12/01/2004.  Her last INR was 2.4 ratio.  Anticoagulation responsible provider: Antoine Poche MD, Fayrene Fearing.  INR POC: 2.9.  Cuvette Lot#: [id number].  Exp: 10/11.    Anticoagulation Management Assessment/Plan:      The patient's current anticoagulation dose is Coumadin 5 mg tabs: Take by mouth as directed by Anticoagulation Clinic.  The target INR is 2 - 3.  The next INR is due 10/22/2009.  Anticoagulation instructions were given to patient.  Results were reviewed/authorized by Vashti Hey, RN.  She was notified by Vashti Hey RN.         Prior Anticoagulation Instructions: INR 2.6 Continue coumadin 2.5mg  once daily except 5mg  on M,W,F  Current Anticoagulation Instructions: INR 2.9 Continue coumadin 2.5mg  once daily except 5mg  on M,W,F

## 2010-03-18 NOTE — Assessment & Plan Note (Signed)
Summary: syncopal spell/appt at 10:15   Visit Type:  Follow-up Primary Provider:  Cato Mulligan, MD   History of Present Illness: The patient presents today for electrophysiology followup. She reports having an episode of near syncope 05/18/09 while driving.  She feels that things "went black" for a split second and then she returned to her baseline health state.  She had an episode of syncope several months ago for which she required hospitalization.   She is unaware of any recent symptoms of afib.  She denies any further presyncope or syncope.   The patient denies symptoms of  chest pain, shortness of breath, orthopnea, PND, lower extremity edema, dizziness,  or neurologic sequela. The patient is tolerating medications without difficulties and is otherwise without complaint today.   Current Medications (verified): 1)  Aspirin 81 Mg Tbec (Aspirin) .... Take One Tablet By Mouth Daily 2)  Glipizide 10 Mg Tabs (Glipizide) .Marland Kitchen.. 1 Tablet in The Am 1/2 Tablet in Thepm 3)  Diltiazem Hcl Er Beads 360 Mg Xr24h-Cap (Diltiazem Hcl Er Beads) .... Take One Capsule By Mouth Daily 4)  Potassium Chloride Crys Cr 20 Meq Cr-Tabs (Potassium Chloride Crys Cr) .... Take One Tablet By Mouth Once Daily 5)  Atenolol 50 Mg Tabs (Atenolol) .... Take 1 Tablet By Mouth Every Morning and 1/2 Tablet Every Evening. 6)  Pravastatin Sodium 40 Mg Tabs (Pravastatin Sodium) .... Take 1 Tablet By Mouth Every Night 7)  Tikosyn 500 Mcg Caps (Dofetilide) .... Take 1 Tablet By Mouth Two Times A Day 8)  Coumadin 5 Mg Tabs (Warfarin Sodium) .... Take By Mouth As Directed By Anticoagulation Clinic 9)  Bumetanide 2 Mg Tabs (Bumetanide) .... Take 1 Tablet By Mouth Once A Day 10)  Onglyza 5 Mg Tabs (Saxagliptin Hcl) .... Once Daily  Allergies: 1)  Codeine  Past History:  Past Medical History:  1. Atrial fibrillation  2. Atrial flutter previously documented on EKG without flutter ablation previously.   3. Coronary artery disease status  post bare-metal stent of the left circumflex artery in September 2008.   4. Preserved ejection fraction.   5. Type 2 diabetes mellitus.   6. Hyperlipidemia.   7. Thyroid nodules per pt  8. Recurrent syncope  Past Surgical History: Reviewed history from 03/19/2008 and no changes required. Ventral hernia repair Rotator Cuff Hysterectomy Lumpectomy  Family History: Reviewed history from 03/19/2008 and no changes required. Unremarkable  Social History: Reviewed history from 11/07/2008 and no changes required. Pt lives in [place] with spouse.   Retired from [employer]. Tobacco Use - No.  Alcohol Use - no  Review of Systems       All systems are reviewed and negative except as listed in the HPI.   Vital Signs:  Patient profile:   64 year old female Height:      63 inches Weight:      175 pounds BMI:     31.11 Pulse rate:   54 / minute Pulse (ortho):   52 / minute BP sitting:   136 / 70  (left arm) BP standing:   131 / 70  Vitals Entered By: Laurance Flatten CMA (May 29, 2009 11:11 AM)  Serial Vital Signs/Assessments:  Time      Position  BP       Pulse  Resp  Temp     By           Lying RA  134/80   56  Laurance Flatten CMA           Sitting   130/67   [address] CMA           Standing  131/70   52                    Laurance Flatten CMA           Standing  131/75   49                    Laurance Flatten CMA           Standing  129/69   47                    Jewel Hardy CMA   Physical Exam  General:  Well developed, well nourished, in no acute distress. Head:  normocephalic and atraumatic Eyes:  PERRLA/EOM intact; conjunctiva and lids normal. Mouth:  Teeth, gums and palate normal. Oral mucosa normal. Neck:  Neck supple, no JVD. No masses, thyromegaly or abnormal cervical nodes. Lungs:  Clear bilaterally to auscultation and percussion. Heart:  Non-displaced PMI, chest non-tender; regular rate and rhythm, S1, S2 without murmurs, rubs or gallops.  Carotid upstroke normal, no bruit. Normal abdominal aortic size, no bruits. Femorals normal pulses, no bruits. Pedals normal pulses. No edema, no varicosities. Abdomen:  Bowel sounds positive; abdomen soft and non-tender without masses, organomegaly, or hernias noted. No hepatosplenomegaly. Msk:  Back normal, normal gait. Muscle strength and tone normal. Pulses:  pulses normal in all 4 extremities Extremities:  No clubbing or cyanosis. Neurologic:  Alert and oriented x 3. Skin:  Intact without lesions or rashes. Cervical Nodes:  no significant adenopathy Psych:  Normal affect.   EKG  Procedure date:  05/29/2009  Findings:      sinus bradycardia 56 bpm, PR 192, QRS 92, QT 472, otherwise normal ekg  Impression & Recommendations:  Problem # 1:  SYNCOPE (ICD-780.2) Ms [name] [name] presents today for further evaluation of recurrent syncope.  She has had two episodes of unexplained syncope over the past few months.  A recent event monitor recorded episodic afib, though she did not have syncope while wearing the monitor.  I am concerned about the possiblity of bradyarrhythmias, though during her prior episode of syncope, she was found to have afib with RVR upon hospital arrival.  I had a long discussion with Ms Berkley today.  Her EKG appears stable and her recent event monitor showed no ventricular arrhythmias related to tikosyn.  At this point, I think that the most helpful approach would be implantation of an ILR monitor to evaluate the cause for relatively infrequent, yet recurrent unexplained syncope.  Risks/benefits/ alternatives to ILR implant were discussed with the patient who wishes to proceed.  The patient was made aware of DMV's recommendation that she not drive for 6 months following her syncope.  Problem # 2:  ATRIAL FIBRILLATION (ICD-427.31) stable she feels relatively asymptomatic from her afib presently in sinus rhythm on tikosyn. an ILR will be very helpful in following her afib burden  and also assessing rate control longterm. continue coumadin she remains clear in her decision to decline catheter ablation  Problem # 3:  HYPERTENSION, UNSPECIFIED (ICD-401.9) stable no changes today  Problem # 4:  HYPERLIPIDEMIA-MIXED (ICD-272.4) stable Her updated medication list for this problem includes:  Pravastatin Sodium 40 Mg Tabs (Pravastatin sodium) .Marland Kitchen... Take 1 tablet by mouth every night  Other Orders: TLB-BMP (Basic Metabolic Panel-BMET) (80048-METABOL) TLB-CBC Platelet - w/Differential (85025-CBCD) TLB-PTT (85730-PTTL) TLB-PT (Protime) (85610-PTP)   Patient Instructions: 1)  Your physician has recommended that you have an implantable loop recorder (ILR).  You may need an implantable loop recorder if other event monitors are unsuccessful in documenting abnormal heart rhythms. Implantable loop recorders are about the size of a pack of gum. This type of event monitor is inserted under the skin on your chest. The device records either when you activate it or automatically when symptoms occur. This is done in the hospital under local anesthesia and requires approximately one half day in the hospital. Please see the instruction sheet given to you today for more information.

## 2010-03-18 NOTE — Medication Information (Signed)
Summary: CCR  Anticoagulant Therapy  Managed by: Vashti Hey, RN PCP: Cato Mulligan, MD Supervising MD: Andee Lineman MD, Michelle Piper Indication 1: Atrial Fibrillation (ICD-427.31) [facility] Site: [facility] INR POC 3.0  Dietary changes: no    Health status changes: no    Bleeding/hemorrhagic complications: no    Recent/future hospitalizations: yes       Details: pacemaker implanted on3/10/11  Any changes in medication regimen? no    Recent/future dental: no  Any missed doses?: no       Is patient compliant with meds? yes       Allergies: 1)  Codeine  Anticoagulation Management History:      The patient is taking warfarin and comes in today for a routine follow up visit.  Positive risk factors for bleeding include an age of 67 years or older and presence of serious comorbidities.  The bleeding index is 'intermediate risk'.  Positive CHADS2 values include History of HTN and History of Diabetes.  Negative CHADS2 values include Age > 13 years old.  The start date was 12/01/2004.  Her last INR was 2.4 ratio.  Anticoagulation responsible provider: Andee Lineman MD, Michelle Piper.  INR POC: 3.0.  Cuvette Lot#: [id number].  Exp: 10/11.    Anticoagulation Management Assessment/Plan:      The patient's current anticoagulation dose is Coumadin 5 mg tabs: Take by mouth as directed by Anticoagulation Clinic.  The target INR is 2 - 3.  The next INR is due 07/30/2009.  Anticoagulation instructions were given to patient.  Results were reviewed/authorized by Vashti Hey, RN.  She was notified by Vashti Hey RN.         Prior Anticoagulation Instructions: INR 2.7 Continue coumadin 2.5mg  once daily except 5mg  on M,W,F  Current Anticoagulation Instructions: INR 3.0 Take coumadin 1/2 tablet tonight then resume 2.5mg  once daily except 5mg  on M,W,F

## 2010-03-18 NOTE — Progress Notes (Signed)
Summary: bilateral leg edema  Phone Note Call from Patient Call back at Home Phone [phone]   Caller: Patient Call For: nurse Summary of Call: patient called with c/o BLE thats increased recently especially after activity. Patient said she read a book she received after having her pacemaker placed that stated to inform MD if swelling occurred. Nurse informed patient that she needed OV and to keep her legs elevated as much as possible. Nurse also informed patient that if swelling get worse,she get SOB,NV,dizziness,chest pain, to proceed to ED for evaluation. patient verbalized understanding of plan.   Initial call taken by: Carlye Grippe,  August 09, 2009 3:56 PM  Follow-up for Phone Call        Agree Follow-up by: Lewayne Bunting, MD, [facility],  August 11, 2009 2:37 PM

## 2010-03-18 NOTE — Medication Information (Signed)
Summary: CCR  Anticoagulant Therapy  Managed by: Vashti Hey, RN PCP: Cato Mulligan, MD Supervising MD: Andee Lineman MD, Michelle Piper Indication 1: Atrial Fibrillation (ICD-427.31) Lab [facility] [place] Site: [facility] INR POC 3.5  Dietary changes: no    Health status changes: no    Bleeding/hemorrhagic complications: no    Recent/future hospitalizations: yes       Details: has been in [facility] for AF with RVR  D/C home 03/22/09  Any changes in medication regimen? yes       Details: Tikosyn increase to 500mg  bid  Recent/future dental: no  Any missed doses?: no       Is patient compliant with meds? yes      Comments: INR at D/C on 03/22/09 was 2.32  Allergies: 1)  Codeine  Anticoagulation Management History:      The patient is taking warfarin and comes in today for a routine follow up visit.  Positive risk factors for bleeding include an age of 27 years or older and presence of serious comorbidities.  The bleeding index is 'intermediate risk'.  Positive CHADS2 values include History of HTN and History of Diabetes.  Negative CHADS2 values include Age > 57 years old.  The start date was 12/01/2004.  Her last INR was 2.4 RATIO.  Anticoagulation responsible provider: Andee Lineman MD, Michelle Piper.  INR POC: 3.5.  Cuvette Lot#: [id number].  Exp: 10/11.    Anticoagulation Management Assessment/Plan:      The patient's current anticoagulation dose is Coumadin 5 mg tabs: Take by mouth as directed by Anticoagulation Clinic.  The target INR is 2 - 3.  The next INR is due 04/05/2009.  Anticoagulation instructions were given to patient.  Results were reviewed/authorized by Vashti Hey, RN.  She was notified by Vashti Hey RN.         Prior Anticoagulation Instructions: INR 2.5 Continue coumadin 2.5mg  once daily except 5mg  on Mondays and Fridays  Current Anticoagulation Instructions: INR 3.5 Hold coumadin tonight then decrease dose to 2.5mg  once daily except 5mg  on Mondays

## 2010-03-18 NOTE — Medication Information (Signed)
Summary: ccr-lr  Anticoagulant Therapy  Managed by: Vashti Hey, RN PCP: Cato Mulligan, MD Supervising MD: Antoine Poche MD, Fayrene Fearing Indication 1: Atrial Fibrillation (ICD-427.31) Lab Used: [facility] Site: [facility] INR POC 2.8  Dietary changes: no    Health status changes: no    Bleeding/hemorrhagic complications: no    Recent/future hospitalizations: no    Any changes in medication regimen? no    Recent/future dental: no  Any missed doses?: no       Is patient compliant with meds? yes       Allergies: 1)  Codeine 2)  Metformin Hcl  Anticoagulation Management History:      The patient is taking warfarin and comes in today for a routine follow up visit.  Positive risk factors for bleeding include an age of 67 years or older and presence of serious comorbidities.  The bleeding index is 'intermediate risk'.  Positive CHADS2 values include History of HTN and History of Diabetes.  Negative CHADS2 values include Age > 8 years old.  The start date was 12/01/2004.  Her last INR was 2.4 ratio.  Anticoagulation responsible Markail Diekman: Antoine Poche MD, Fayrene Fearing.  INR POC: 2.8.  Cuvette Lot#: [id number].  Exp: 10/11.    Anticoagulation Management Assessment/Plan:      The patient's current anticoagulation dose is Coumadin 5 mg tabs: Take by mouth as directed by Anticoagulation Clinic.  The target INR is 2 - 3.  The next INR is due 11/19/2009.  Anticoagulation instructions were given to patient.  Results were reviewed/authorized by Vashti Hey, RN.  She was notified by Vashti Hey RN.         Prior Anticoagulation Instructions: INR 2.9 Continue coumadin 2.5mg  once daily except 5mg  on M,W,F  Current Anticoagulation Instructions: INR 2.8 Continue coumadin 2.5mg  once daily except 5mg  on M,W,F

## 2010-03-18 NOTE — Progress Notes (Signed)
Summary: pt still having pain  Phone Note Call from Patient Call back at Home Phone [phone] Call back at cell# [phone]   Caller: Patient Reason for Call: Talk to Nurse, Talk to Doctor Summary of Call: pt still having a lot of pain and wants to be seen today because the pain she dicuss with you last week is not any better Initial call taken by: Omer Jack,  Jul 16, 2009 8:04 AM  Follow-up for Phone Call        will see Dr Johney Frame on 07/17/09 to discuss pain pt aware Dennis Bast, RN, BSN  Jul 16, 2009 8:45 AM

## 2010-03-18 NOTE — Letter (Signed)
Summary: Engineer, materials at [facility]  [address]   [place], [place] [address]   Phone: [phone]  Fax: [phone]        August 28, 2009 MRN: [id number]    [facility] [address] [place], [place]  [address]    Dear [name],  Your test ordered by Selena Batten has been reviewed by your physician (or physician assistant) and was found to be normal or stable. Your physician (or physician assistant) felt no changes were needed at this time.  ____ Echocardiogram  ____ Cardiac Stress Test  _X___ Lab Work-Continue current medications  ____ Peripheral vascular study of arms, legs or neck  ____ CT scan or X-ray  ____ Lung or Breathing test  ____ Other:   Thank you.   Cyril Loosen, RN, BSN    Duane Boston, M.D., F.A.C.C. Thressa Sheller, M.D., F.A.C.C. Oneal Grout, M.D., F.A.C.C. Cheree Ditto, M.D., F.A.C.C. Daiva Nakayama, M.D., F.A.C.C. Kenney Houseman, M.D., F.A.C.C. Jeanne Ivan, PA-C

## 2010-03-18 NOTE — Assessment & Plan Note (Signed)
Summary: 3 MONTH PACEMAKER CK.   Visit Type:  Pacemaker check Primary Provider:  Cato Mulligan, MD   History of Present Illness: The patient presents today for routine electrophysiology followup. She reports doing very well since last being seen in our clinic.  She still has occasional swelling in the evenings.  Her afib is much better controlled at this time.  The patient denies symptoms of palpitations, chest pain, shortness of breath, orthopnea, PND,  dizziness, presyncope, syncope, or neurologic sequela. The patient is tolerating medications without difficulties and is otherwise without complaint today.   Preventive Screening-Counseling & Management  Alcohol-Tobacco     Smoking Status: never  Current Medications (verified): 1)  Aspirin 81 Mg Tbec (Aspirin) .... Take One Tablet By Mouth Daily 2)  Glipizide 10 Mg Tabs (Glipizide) .Marland Kitchen.. 1 Tablet in The Am 1/2 Tablet in Thepm 3)  Diltiazem Hcl Er Beads 360 Mg Xr24h-Cap (Diltiazem Hcl Er Beads) .... Take One Capsule By Mouth Daily 4)  Potassium Chloride Crys Cr 20 Meq Cr-Tabs (Potassium Chloride Crys Cr) .... Take One Tablet By Mouth Twice Daily 5)  Atenolol 50 Mg Tabs (Atenolol) .... Take 1 Tablet By Mouth Every Morning 6)  Pravastatin Sodium 40 Mg Tabs (Pravastatin Sodium) .... Take 1 Tablet By Mouth Every Night 7)  Tikosyn 500 Mcg Caps (Dofetilide) .... Take 1 Tablet By Mouth Two Times A Day 8)  Coumadin 5 Mg Tabs (Warfarin Sodium) .... Take By Mouth As Directed By Anticoagulation Clinic 9)  Bumetanide 2 Mg Tabs (Bumetanide) .... Take 2 Tablets Every Morning and 1 Tablet Every Evening. 10)  Onglyza 5 Mg Tabs (Saxagliptin Hcl) .... Once Daily 11)  Nitrostat 0.4 Mg Subl (Nitroglycerin) .Marland Kitchen.. 1 Tablet Under Tongue At Onset of Chest Pain; You May Repeat Every 5 Minutes For Up To 3 Doses.  Allergies (verified): 1)  Codeine 2)  Metformin Hcl  Comments:  Nurse/Medical Assistant: The patient's medication list and allergies were reviewed with  the patient and were updated in the Medication and Allergy Lists.  Past History:  Past Medical History: Reviewed history from 07/17/2009 and no changes required.  1. Atrial fibrillation  2. Atrial flutter previously documented on EKG without flutter ablation previously.   3. Coronary artery disease status post bare-metal stent of the left circumflex artery in September 2008.   4. Preserved ejection fraction.   5. Type 2 diabetes mellitus.   6. Hyperlipidemia.   7. Thyroid nodules per pt  8. 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 Ventral hernia repair Rotator Cuff Hysterectomy Lumpectomy S/p PPM for syncope/ tachycardia bradycardia syndrome  Social History: Reviewed history from 11/07/2008 and no changes required. Pt lives in [place] with spouse.   Retired from [employer]. Tobacco Use - No.  Alcohol Use - no  Review of Systems       All systems are reviewed and negative except as listed in the HPI.   Vital Signs:  Patient profile:   65 year old female Height:      63 inches Weight:      183 pounds Pulse rate:   76 / minute BP sitting:   137 / 88  (left arm) Cuff size:   regular  Vitals Entered By: Carlye Grippe (September 27, 2009 9:28 AM)  Physical Exam  General:  Well developed, well nourished, in no acute distress. Head:  normocephalic and atraumatic Eyes:  PERRLA/EOM intact; conjunctiva and  lids normal. Mouth:  Teeth, gums and palate normal. Oral mucosa normal. Neck:  supple, JVP 9 cm Chest Wall:  Pacemaker site is well healed Lungs:  Clear bilaterally to auscultation and percussion. Heart:  Non-displaced PMI, chest non-tender; regular rate and rhythm, S1, S2 without murmurs, rubs or gallops. Carotid upstroke normal, no bruit. Normal abdominal aortic size, no bruits. Femorals normal pulses, no bruits. Pedals normal pulses. No edema, no  varicosities. Abdomen:  Bowel sounds positive; abdomen soft and non-tender without masses, organomegaly, or hernias noted. No hepatosplenomegaly. Msk:  Back normal, normal gait. Muscle strength and tone normal. Pulses:  pulses normal in all 4 extremities Extremities:  No clubbing or cyanosis. Neurologic:  Alert and oriented x 3.   PPM Specifications Following MD:  Hillis Range, MD     PPM Vendor:  [employer]     PPM Model Number:  [id number]     PPM Serial Number:  [id number] PPM DOI:  06/25/2009     PPM Implanting MD:  Hillis Range, MD  Lead 1    Location: RA     DOI: 06/25/2009     Model #: [id number]     Serial #: [id number]     Status: active Lead 2    Location: RV     DOI: 06/25/2009     Model #: [id number]     Serial #: [id number]     Status: active  Magnet Response Rate:  BOL 85 ERI 65  Indications:  Tachy-brady syndrome   PPM Follow Up Battery Voltage:  2.79 V     Battery Est. Longevity:  9.5 yrs     Pacer Dependent:  No       PPM Device Measurements Atrium  Amplitude: 5.60 mV, Impedance: 567 ohms, Threshold: 0.50 V at 0.40 msec Right Ventricle  Amplitude: 31.36 mV, Impedance: 886 ohms, Threshold: 0.50 V at 0.40 msec  Episodes MS Episodes:  4562     Percent Mode Switch:  3.5%     Coumadin:  Yes Ventricular High Rate:  0     Atrial Pacing:  89.2%     Ventricular Pacing:  3.3%  Parameters Mode:  DDDR+     Lower Rate Limit:  60     Upper Rate Limit:  130 Paced AV Delay:  250     Sensed AV Delay:  220 Next Cardiology Appt Due:  06/17/2010 Tech Comments:  3.5 % OF TIME IN AF. + COUMADIN.  NORMAL DEVICE FUNCTION.  CHANGED RA OUTPUT TO 2.0 AND RV OUTPUT FROM 2.5 V.  PT WANTS TO THINK ABOUT CARELINK.  ROV IN 5-12 W/JA. Vella Kohler  September 27, 2009 9:49 AM MD Comments:  agree  ILR Following MD Hillis Range, MD    Impression & Recommendations:  Problem # 1:  BRADYCARDIA-TACHYCARDIA SYNDROME (ICD-427.81) doing well s/p PPM no further syncope normal pacemaker function changes as  above  Problem # 2:  ATRIAL FIBRILLATION (ICD-427.31) well controlled with tikosyn continue coumadin she will consider switching to pradaxa pending a discussion with her insurance company regarding the costs I have instructed her to contact our office if she wishes to make this change  Problem # 3:  HYPERTENSION, UNSPECIFIED (ICD-401.9) stable salt restriction for edema was advised  Patient Instructions: 1)  return 5/12

## 2010-03-18 NOTE — Letter (Signed)
Summary: Engineer, materials at [facility]  [address]   [place], [place] [address]   Phone: [phone]  Fax: [phone]        November 22, 2009 MRN: [id number]   [facility] [address] [place], [place]  [address]   Dear Ms. Gregson,  Your test ordered by Selena Batten has been reviewed by your physician (or physician assistant) and was found to be normal or stable. Your physician (or physician assistant) felt no changes were needed at this time.  __X__ Echocardiogram  ____ Cardiac Stress Test  ____ Lab Work  ____ Peripheral vascular study of arms, legs or neck  ____ CT scan or X-ray  ____ Lung or Breathing test  ____ Other:   Thank you.   Hoover Brunette, LPN    Duane Boston, M.D., F.A.C.C. Thressa Sheller, M.D., F.A.C.C. Oneal Grout, M.D., F.A.C.C. Cheree Ditto, M.D., F.A.C.C. Daiva Nakayama, M.D., F.A.C.C. Kenney Houseman, M.D., F.A.C.C. Jeanne Ivan, PA-C

## 2010-03-18 NOTE — Assessment & Plan Note (Signed)
Summary: pc2   Visit Type:  Follow-up Primary Provider:  Cato Mulligan, MD  CC:  Pain in left arm  from device implant. Redness at site of device.[name] [name]  History of Present Illness: The patient presents today for electrophysiology followup.  She reports mild discomfort over her pacemaker site.  She denies warmth, redness, or discharge. The patient denies symptoms of palpitations, chest pain, shortness of breath, orthopnea, PND, lower extremity edema, dizziness, presyncope, syncope, or neurologic sequela. The patient is tolerating medications without difficulties and is otherwise without complaint today.   Current Medications (verified): 1)  Aspirin 81 Mg Tbec (Aspirin) .... Take One Tablet By Mouth Daily 2)  Glipizide 10 Mg Tabs (Glipizide) .[name] [name].. 1 Tablet in The Am 1/2 Tablet in Thepm 3)  Diltiazem Hcl Er Beads 360 Mg Xr24h-Cap (Diltiazem Hcl Er Beads) .... Take One Capsule By Mouth Daily 4)  Potassium Chloride Crys Cr 20 Meq Cr-Tabs (Potassium Chloride Crys Cr) .... Take One Tablet By Mouth Twice Daily 5)  Atenolol 50 Mg Tabs (Atenolol) .... Take 1 Tablet By Mouth Every Morning 6)  Pravastatin Sodium 40 Mg Tabs (Pravastatin Sodium) .... Take 1 Tablet By Mouth Every Night 7)  Tikosyn 500 Mcg Caps (Dofetilide) .... Take 1 Tablet By Mouth Two Times A Day 8)  Coumadin 5 Mg Tabs (Warfarin Sodium) .... Take By Mouth As Directed By Anticoagulation Clinic 9)  Bumetanide 2 Mg Tabs (Bumetanide) .... Take 1 Tablet By Mouth Two Times A Day 10)  Onglyza 5 Mg Tabs (Saxagliptin Hcl) .... Once Daily 11)  Losartan Potassium 25 Mg Tabs (Losartan Potassium) .... Take 1 Tablet By Mouth Once A Day 12)  Nitrostat 0.4 Mg Subl (Nitroglycerin) .[name] [name].. 1 Tablet Under Tongue At Onset of Chest Pain; You May Repeat Every 5 Minutes For Up To 3 Doses.  Allergies (verified): 1)  Codeine 2)  Metformin Hcl  Past History:  Past Medical History:  1. Atrial fibrillation  2. Atrial flutter previously documented on EKG without  flutter ablation previously.   3. Coronary artery disease status post bare-metal stent of the left circumflex artery in September 2008.   4. Preserved ejection fraction.   5. Type 2 diabetes mellitus.   6. Hyperlipidemia.   7. Thyroid nodules per pt  8. Recurrent syncope  9. s/p PPM for tachybrady syndrome and syncope  Past Surgical History: Ventral hernia repair Rotator Cuff Hysterectomy Lumpectomy S/p PPM for syncope/ tachycardia bradycardia syndrome  Vital Signs:  Patient profile:   66 year old female Height:      63 inches Weight:      178 pounds BMI:     31.65 Pulse rate:   55 / minute BP sitting:   130 / 80  (left arm)  Vitals Entered By: Laurance Flatten CMA (July 17, 2009 8:54 AM) CC: Pain in left arm  from device implant. Redness at site of device. Pain Assessment Patient in pain? yes      Comments Pt needs refill on Nitro.   Physical Exam  General:  Well developed, well nourished, in no acute distress. Head:  normocephalic and atraumatic Eyes:  PERRLA/EOM intact; conjunctiva and lids normal. Mouth:  Teeth, gums and palate normal. Oral mucosa normal. Neck:  supple, JVP 9 cm Chest Wall:  Pacemaker pocket and ILR site are healing nicely.  Mild ttp over the pacemaker site.  No drainage/ erythema/ warmth Lungs:  clear Heart:  RRR Abdomen:  Bowel sounds positive; abdomen soft and non-tender without masses, organomegaly, or hernias noted.  No hepatosplenomegaly. Msk:  Back normal, normal gait. Muscle strength and tone normal. Neurologic:  Alert and oriented x 3.   PPM Specifications Following MD:  Hillis Range, MD     PPM Vendor:  [employer]     PPM Model Number:  [id number]     PPM Serial Number:  [id number] PPM DOI:  06/25/2009     PPM Implanting MD:  Hillis Range, MD  Lead 1    Location: RA     DOI: 06/25/2009     Model #: [id number]     Serial #: [id number]     Status: active Lead 2    Location: RV     DOI: 06/25/2009     Model #: [id number]     Serial #: [id number]     Status:  active  Magnet Response Rate:  BOL 85 ERI 65  Indications:  Tachy-brady syndrome   PPM Follow Up Pacer Dependent:  No      Episodes Coumadin:  Yes  Parameters Mode:  DDDR+     Lower Rate Limit:  60     Upper Rate Limit:  130 Paced AV Delay:  250     Sensed AV Delay:  220  ILR Following MD Hillis Range, MD    Impression & Recommendations:  Problem # 1:  SYNCOPE (ICD-780.2) doing well s/p PPM PPM site appears to be healing nicely pt reassured today  Problem # 2:  ATRIAL FIBRILLATION (ICD-427.31) stable no changes today  Problem # 3:  CAD, UNSPECIFIED SITE (ICD-414.00) stable, without symptoms of ischemia slNTG refilled today  Patient Instructions: 1)  keep previously scheduled follow-up

## 2010-03-18 NOTE — Assessment & Plan Note (Signed)
Summary: EPH- 2 WK FU PER C.[facility]   Visit Type:  hospital follow-up Primary Provider:  Cato Mulligan, MD  CC:  hospital follow-up visit.  History of Present Illness: The patient presents today for routine electrophysiology followup. She reports doing well since her recent hospitalization for afib.  She reports occasional palpitations lasting 1-2 minutes but denies further sustained episodes of afib. The patient denies symptoms of  chest pain, shortness of breath, orthopnea, PND, lower extremity edema, dizziness, presyncope, syncope, or neurologic sequela. The patient is tolerating medications without difficulties and is otherwise without complaint today.   Current Medications (verified): 1)  Aspirin 81 Mg Tbec (Aspirin) .... Take One Tablet By Mouth Daily 2)  Glucotrol Xl 5 Mg Xr24h-Tab (Glipizide) .... Take 1 Tablet By Mouth Once A Day 3)  Diltiazem Hcl Er Beads 360 Mg Xr24h-Cap (Diltiazem Hcl Er Beads) .... Take One Capsule By Mouth Daily 4)  Potassium Chloride Crys Cr 20 Meq Cr-Tabs (Potassium Chloride Crys Cr) .... Take One Tablet By Mouth Once Daily 5)  Atenolol 50 Mg Tabs (Atenolol) .... Take 1 Tablet By Mouth Every Morning and 1/2 Tablet Every Evening. 6)  Lisinopril 10 Mg Tabs (Lisinopril) .... One By Mouth Once Daily 7)  Pravastatin Sodium 40 Mg Tabs (Pravastatin Sodium) .... Take 1 Tablet By Mouth Every Night 8)  Tikosyn 500 Mcg Caps (Dofetilide) .... Take 1 Tablet By Mouth Two Times A Day 9)  Coumadin 5 Mg Tabs (Warfarin Sodium) .... Take By Mouth As Directed By Anticoagulation Clinic 10)  Bumetanide 2 Mg Tabs (Bumetanide) .... Take 1 Tablet By Mouth Once A Day 11)  Nitroglycerin 0.4 Mg Subl (Nitroglycerin) .... Place 1 Tablet Under Tongue As Directed 12)  Onglyza 2.5 Mg Tabs (Saxagliptin Hcl) .... Once Daily  Allergies (verified): 1)  Codeine  Comments:  Nurse/Medical Assistant: The patient's medications and allergies were reviewed with the patient and were updated in the  Medication and Allergy Lists. List reviewed.  Past History:  Past Medical History:  1. Atrial fibrillation  2. Atrial flutter previously documented on EKG without flutter ablation previously.   3. Coronary artery disease status post bare-metal stent of the left circumflex artery in September 2008.   4. Preserved ejection fraction.   5. Type 2 diabetes mellitus.   6. Hyperlipidemia.   7. Thyroid nodules per pt  Past Surgical History: Reviewed history from 03/19/2008 and no changes required. Ventral hernia repair Rotator Cuff Hysterectomy Lumpectomy  Social History: Reviewed history from 11/07/2008 and no changes required. Pt lives in [place] with spouse.   Retired from [employer]. Tobacco Use - No.  Alcohol Use - no  Review of Systems       All systems are reviewed and negative except as listed in the HPI.   Vital Signs:  Patient profile:   65 year old female Height:      63 inches Weight:      170 pounds Pulse rate:   66 / minute BP sitting:   149 / 80  (left arm) Cuff size:   regular  Vitals Entered By: Carlye Grippe (April 04, 2009 8:51 AM) CC: hospital follow-up visit   Physical Exam  General:  Well developed, well nourished, in no acute distress. Head:  normocephalic and atraumatic Eyes:  PERRLA/EOM intact; conjunctiva and lids normal. Nose:  no deformity, discharge, inflammation, or lesions Mouth:  Teeth, gums and palate normal. Oral mucosa normal. Neck:  Neck supple, no JVD. No masses, thyromegaly or abnormal cervical nodes. Lungs:  Clear bilaterally to auscultation and percussion. Heart:  Non-displaced PMI, chest non-tender; regular rate and rhythm, S1, S2 without murmurs, rubs or gallops. Carotid upstroke normal, no bruit. Normal abdominal aortic size, no bruits. Femorals normal pulses, no bruits. Pedals normal pulses. No edema, no varicosities. Abdomen:  Bowel sounds positive; abdomen soft and non-tender without masses, organomegaly, or hernias noted.  No hepatosplenomegaly. Msk:  Back normal, normal gait. Muscle strength and tone normal. Pulses:  pulses normal in all 4 extremities Extremities:  No clubbing or cyanosis. Neurologic:  Alert and oriented x 3. Skin:  Intact without lesions or rashes. Psych:  Normal affect.   EKG  Procedure date:  04/04/2009  Findings:      sinus rhythm 65 bpm, QT 468, nonspecific St/T changes  Impression & Recommendations:  Problem # 1:  ATRIAL FIBRILLATION (ICD-427.31) The patient presents for EP follow-up of her afib. She reports palpitations on occasion, lasting 1-2 minutes.  She has an event monitor ordered, which has not been placed. She is tolerating her medicines including her recently added atenolol. Her QT today appears stable.  We will follow her QT. She hopes to avoid catheter ablation.  Problem # 2:  HYPERTENSION, UNSPECIFIED (ICD-401.9) Stable She has not taken her medicines this AM.   salt restriction is advised  Her updated medication list for this problem includes:    Aspirin 81 Mg Tbec (Aspirin) .Marland Kitchen... Take one tablet by mouth daily    Diltiazem Hcl Er Beads 360 Mg Xr24h-cap (Diltiazem hcl er beads) .Marland Kitchen... Take one capsule by mouth daily    Atenolol 50 Mg Tabs (Atenolol) .Marland Kitchen... Take 1 tablet by mouth every morning and 1/2 tablet every evening.    Lisinopril 10 Mg Tabs (Lisinopril) ..... One by mouth once daily    Bumetanide 2 Mg Tabs (Bumetanide) .Marland Kitchen... Take 1 tablet by mouth once a day  Problem # 3:  HYPERLIPIDEMIA-MIXED (ICD-272.4) stable Her updated medication list for this problem includes:    Pravastatin Sodium 40 Mg Tabs (Pravastatin sodium) .Marland Kitchen... Take 1 tablet by mouth every night  Problem # 4:  CAD, UNSPECIFIED SITE (ICD-414.00) stable, without symptoms of ischemia  Her updated medication list for this problem includes:    Aspirin 81 Mg Tbec (Aspirin) .Marland Kitchen... Take one tablet by mouth daily    Diltiazem Hcl Er Beads 360 Mg Xr24h-cap (Diltiazem hcl er beads) .Marland Kitchen... Take  one capsule by mouth daily    Atenolol 50 Mg Tabs (Atenolol) .Marland Kitchen... Take 1 tablet by mouth every morning and 1/2 tablet every evening.    Lisinopril 10 Mg Tabs (Lisinopril) ..... One by mouth once daily    Coumadin 5 Mg Tabs (Warfarin sodium) .Marland Kitchen... Take by mouth as directed by anticoagulation clinic    Nitroglycerin 0.4 Mg Subl (Nitroglycerin) .Marland Kitchen... Place 1 tablet under tongue as directed  Other Orders: EKG w/ Interpretation (93000)  Patient Instructions: 1)  Follow up in 6 weeks in [place] office.  2)  Your physician recommends that you continue on your current medications as directed. Please refer to the Current Medication list given to you today. Prescriptions: DILTIAZEM HCL ER BEADS 360 MG XR24H-CAP (DILTIAZEM HCL ER BEADS) Take one capsule by mouth daily  #30 x 6   Entered by:   Cyril Loosen, RN, BSN   Authorized by:   Hillis Range, MD   Signed by:   Cyril Loosen, RN, BSN on 04/04/2009   Method used:   Electronically to        [facility]  [address] # [phone]* (retail)       [address]       [place], [place]  [address]       Ph: [phone] or [phone]       Fax: [phone]   RxID:   [id number]

## 2010-03-18 NOTE — Medication Information (Signed)
Summary: ccr  Anticoagulant Therapy  Managed by: Vashti Hey, RN PCP: Cato Mulligan, MD Supervising MD: Antoine Poche MD, Fayrene Fearing Indication 1: Atrial Fibrillation (ICD-427.31) Lab Used: [facility] Site: [place] INR POC 1.9  Dietary changes: no    Health status changes: no    Bleeding/hemorrhagic complications: no    Recent/future hospitalizations: no    Any changes in medication regimen? no    Recent/future dental: no  Any missed doses?: no       Is patient compliant with meds? yes       Allergies: 1)  Codeine  Anticoagulation Management History:      The patient is taking warfarin and comes in today for a routine follow up visit.  Positive risk factors for bleeding include an age of 48 years or older and presence of serious comorbidities.  The bleeding index is 'intermediate risk'.  Positive CHADS2 values include History of HTN and History of Diabetes.  Negative CHADS2 values include Age > 78 years old.  The start date was 12/01/2004.  Her last INR was 2.4 RATIO.  Anticoagulation responsible provider: Antoine Poche MD, Fayrene Fearing.  INR POC: 1.9.  Cuvette Lot#: [id number].  Exp: 10/11.    Anticoagulation Management Assessment/Plan:      The patient's current anticoagulation dose is Coumadin 5 mg tabs: Take by mouth as directed by Anticoagulation Clinic.  The target INR is 2 - 3.  The next INR is due 06/18/2009.  Anticoagulation instructions were given to patient.  Results were reviewed/authorized by Vashti Hey, RN.  She was notified by Vashti Hey RN.         Prior Anticoagulation Instructions: INR 2.2 Continue coumadin 2.5mg  once daily except 5mg  on Mondays and Fridays  Current Anticoagulation Instructions: INR 1.9 Increase coumadin to 2.5mg  once daily except 5mg  on M,W,F

## 2010-03-18 NOTE — Assessment & Plan Note (Signed)
Summary: [name] [name] 2/3  need pt/inr checked also   Visit Type:  Follow-up Primary Provider:  Cato Mulligan, MD  CC:  follow-up visit.  History of Present Illness: the patient is a 65 year old female with a history of paroxysmal atrial fibrillation status post pulmonary [facility]is[facility] procedure [facility]. Procedure was complicated by pericardial effusion and tamponade. The patient still is recurrence of atrial fibrillation and is on dofetilide therapy. She was recently hospitalized at [facility] after she felt lightheaded, her legs got weak and she fell to the floor. Initially was thought to the patient blood sugar was low but on arrival the patient instructed to fibrillation which was felt to be the cause of her syncope. In talking with the patient today however is not entirely clear will also consciousness. During hospitalization the patient still felt like was increased to 500 micrograms p.o. b.i.d. today her QTC and her fall is 447 ms. The patient still reports occasional palpitations. There is also no recent electrolyte panel, but the patient is on potassium replacement in conjunction with her bumetanide. The patient reports no shortness of breath on exertion. She has no orthopnea PND or recurrent syncope.  Clinical Review Panels:  Cardiac Imaging Cardiac Cath Findings  Successful percutaneous coronary intervention of the distal   left circumflex with a 2.0 Mini Vision bare metal stent.     (11/01/2006)    Preventive Screening-Counseling & Management  Alcohol-Tobacco     Smoking Status: never  Current Medications (verified): 1)  Aspirin 81 Mg Tbec (Aspirin) .... Take One Tablet By Mouth Daily 2)  Glucotrol Xl 5 Mg Xr24h-Tab (Glipizide) .... Take 1/2 Tablet By Mouth Once A Day 3)  Diltiazem Hcl Er Beads 360 Mg Xr24h-Cap (Diltiazem Hcl Er Beads) .... Take One Capsule By Mouth Daily 4)  Potassium Chloride Crys Cr 20 Meq Cr-Tabs (Potassium Chloride Crys Cr) .... Take One  Tablet By Mouth Once Daily 5)  Atenolol 50 Mg Tabs (Atenolol) .... Take 1 Tablet By Mouth Once A Day 6)  Lisinopril 10 Mg Tabs (Lisinopril) .... One By Mouth Once Daily 7)  Pravastatin Sodium 40 Mg Tabs (Pravastatin Sodium) .... Take 1 Tablet By Mouth Every Night 8)  Tikosyn 500 Mcg Caps (Dofetilide) .... Take 1 Tablet By Mouth Two Times A Day 9)  Coumadin 5 Mg Tabs (Warfarin Sodium) .... Take By Mouth As Directed By Anticoagulation Clinic 10)  Bumetanide 2 Mg Tabs (Bumetanide) .... Take 1 Tablet By Mouth Once A Day 11)  Nitroglycerin 0.4 Mg Subl (Nitroglycerin) .... Place 1 Tablet Under Tongue As Directed 12)  Onglyza 2.5 Mg Tabs (Saxagliptin Hcl) .... Once Daily  Allergies (verified): 1)  Codeine  Comments:  Nurse/Medical Assistant: The patient's medications and allergies were reviewed with the patient and were updated in the Medication and Allergy Lists. List reviewed.  Past History:  Past Medical History: Last updated: 03/19/2008  1. Atrial fibrillation  2. Atrial flutter previously documented on EKG without flutter ablation previously.   3. Coronary artery disease status post bare-metal stent of the left circumflex artery in September 2008.   4. Preserved ejection fraction.   5. Type 2 diabetes mellitus.   6. Hyperlipidemia.   Past Surgical History: Last updated: 03/19/2008 Ventral hernia repair Rotator Cuff Hysterectomy Lumpectomy  Family History: Reviewed history from 03/19/2008 and no changes required. Unremarkable  Social History: Reviewed history from 11/07/2008 and no changes required. Pt lives in [place] with spouse.   Retired from [employer]. Tobacco Use - No.  Alcohol Use - no  Review of Systems       The patient complains of palpitations.  The patient denies fatigue, malaise, fever, weight gain/loss, vision loss, decreased hearing, hoarseness, chest pain, shortness of breath, prolonged cough, wheezing, sleep apnea, coughing up blood, abdominal pain, blood  in stool, nausea, vomiting, diarrhea, heartburn, incontinence, blood in urine, muscle weakness, joint pain, leg swelling, rash, skin lesions, headache, fainting, dizziness, depression, anxiety, enlarged lymph nodes, easy bruising or bleeding, and environmental allergies.    Vital Signs:  Patient profile:   65 year old female Height:      63 inches Weight:      174 pounds Pulse rate:   65 / minute BP sitting:   130 / 83  (left arm) Cuff size:   regular  Vitals Entered By: Carlye Grippe (March 26, 2009 10:35 AM) CC: follow-up visit   Physical Exam  Additional Exam:  General: Well-developed, well-nourished in no distress head: Normocephalic and atraumatic eyes PERRLA/EOMI intact, conjunctiva and lids normal nose: No deformity or lesions mouth normal dentition, normal posterior pharynx neck: Supple, no JVD.  No masses, thyromegaly or abnormal cervical nodes. No carotid bruits lungs: Normal breath sounds bilaterally without wheezing.  Normal percussion heart: regular rate and rhythm with normal S1 and S2, no S3 or S4.  PMI is normal.  No pathological murmurs abdomen: Normal bowel sounds, abdomen is soft and nontender without masses, organomegaly or hernias noted.  No hepatosplenomegaly musculoskeletal: Back normal, normal gait muscle strength and tone normal pulsus: Pulse is normal in all 4 extremities Extremities: No peripheral pitting edema neurologic: Alert and oriented x 3 skin: Intact without lesions or rashes cervical nodes: No significant adenopathy psychologic: Normal affect    EKG  Procedure date:  03/26/2009  Findings:      normal sinus rhythm with sinus arrhythmia. Nonspecific ST-T wave changes. QTC 447 ms. Heart rate 64 beats per minute  Impression & Recommendations:  Problem # 1:  ATRIAL FIBRILLATION (ICD-427.31) the patient is currently in sinus rhythm. We will apply a cardiac monitor to make sure that she doesn't have frequent paroxysms of atrial  fibrillation. Also monitoring of pro arrhythmias on dofetilide therapy is indicated. QTC is 447 ms. I added atenolol 25 mg in the p.m.we'll check electrolytes on dofetilide. Her updated medication list for this problem includes:    Aspirin 81 Mg Tbec (Aspirin) .Marland Kitchen... Take one tablet by mouth daily    Atenolol 50 Mg Tabs (Atenolol) .Marland Kitchen... Take 1 tablet by mouth every morning and 1/2 tablet every evening.    Tikosyn 500 Mcg Caps (Dofetilide) .Marland Kitchen... Take 1 tablet by mouth two times a day    Coumadin 5 Mg Tabs (Warfarin sodium) .Marland Kitchen... Take by mouth as directed by anticoagulation clinic  Orders: EKG w/ Interpretation (93000) Cardionet/Event Monitor (Cardionet/Event)  Problem # 2:  HYPERTENSION, UNSPECIFIED (ICD-401.9) blood pressure is controlled. I made no changes in the patient's medical regimen Her updated medication list for this problem includes:    Aspirin 81 Mg Tbec (Aspirin) .Marland Kitchen... Take one tablet by mouth daily    Diltiazem Hcl Er Beads 360 Mg Xr24h-cap (Diltiazem hcl er beads) .Marland Kitchen... Take one capsule by mouth daily    Atenolol 50 Mg Tabs (Atenolol) .Marland Kitchen... Take 1 tablet by mouth every morning and 1/2 tablet every evening.    Lisinopril 10 Mg Tabs (Lisinopril) ..... One by mouth once daily    Bumetanide 2 Mg Tabs (Bumetanide) .Marland Kitchen... Take 1 tablet by mouth once a day  Orders: T-Basic Metabolic Panel [id number])  Problem # 3:  CAD, UNSPECIFIED SITE (ICD-414.00) the patient is status post angioplasty and stent placement to the circumflex coronary artery. She reports no chest pain. Her updated medication list for this problem includes:    Aspirin 81 Mg Tbec (Aspirin) .Marland Kitchen... Take one tablet by mouth daily    Diltiazem Hcl Er Beads 360 Mg Xr24h-cap (Diltiazem hcl er beads) .Marland Kitchen... Take one capsule by mouth daily    Atenolol 50 Mg Tabs (Atenolol) .Marland Kitchen... Take 1 tablet by mouth every morning and 1/2 tablet every evening.    Lisinopril 10 Mg Tabs (Lisinopril) ..... One by mouth once daily    Coumadin 5  Mg Tabs (Warfarin sodium) .Marland Kitchen... Take by mouth as directed by anticoagulation clinic    Nitroglycerin 0.4 Mg Subl (Nitroglycerin) .Marland Kitchen... Place 1 tablet under tongue as directed  Orders: EKG w/ Interpretation (93000)  Problem # 4:  THYROID MASS (ICD-240.9) followup Dr. Andrey Campanile. No definite evidence of malignancy.  Problem # 5:  CHEST XRAY, ABNORMAL (ICD-793.1) the patient has evidence of basal interstitial markings which have been chronic. She denies having dyspnea. Follow up with a chest x-ray will in the future with her primary care physician.  Patient Instructions: 1)  Take Atenolol 50mg  1 tablet every morning and 1/2 tablet (25mg ) every evening. 2)  Your physician has recommended that you wear an event monitor.  Event monitors are medical devices that record the heart's electrical activity. Doctors most often use these monitors to diagnose arrhythmias. Arrhythmias are problems with the speed or rhythm of the heartbeat. The monitor is a small, portable device. You can wear one while you do your normal daily activities. This is usually used to diagnose what is causing palpitations/syncope (passing out). 3)  Your physician recommends that you go to the [facility] for lab work: this week. 4)  Your physician wants you to follow-up in: 6 months. You will receive a reminder letter in the mail one-two months in advance. If you don't receive a letter, please call our office to schedule the follow-up appointment.

## 2010-03-18 NOTE — Miscellaneous (Signed)
Summary: Orders Update  Clinical Lists Changes  Orders: Added new Test order of T-Basic Metabolic Panel ([id number]) - Signed Added new Test order of T-CBC No Diff ([id number]) - Signed Added new Test order of T-Protime, Auto ([id number]) - Signed Added new Test order of T-PTT ([id number]) - Signed 

## 2010-03-18 NOTE — Progress Notes (Signed)
Summary: Lightheaded  Phone Note Call from Patient Call back at [facility] Phone [phone]   Summary of Call: Pt states last week she reached across her (L) shoulder where her PTVP is with her (R) arm to scratch her back and got lightheaded. She states last Friday night when she layed down for the night she again was lightheaded. She states she took a few deep breaths and this relieved her symptoms. She states she has had similar episodes every night since then and a couple of episodes during the day. She states she went by her primary MD and BP was normal. she denies any chest pain, shortness of breath or other symptoms.   She is aware a note will be sent to Dr. Earnestine Leys for review and she will be notified of his response. Initial call taken by: Cyril Loosen, RN, BSN,  December 11, 2009 3:50 PM  Follow-up for Phone Call        [facility] monitor for now. Follow-up by: Lewayne Bunting, MD, [facility],  December 16, 2009 6:37 AM  Additional Follow-up for Phone Call Additional follow up Details #1::        Left message to notify pt. Additional Follow-up by: Cyril Loosen, RN, BSN,  December 16, 2009 9:57 AM

## 2010-03-18 NOTE — Assessment & Plan Note (Signed)
Summary: f6w/jml   Visit Type:  Follow-up Primary Provider:  Cato Mulligan, MD   History of Present Illness: The patient presents today for routine electrophysiology followup. She reports doing well since last being seen in our clinic.  She has a nonproductive cough.  Her palpitations have significantly improved.  She reports occasional palpitations lasting 1-2 minutes but denies further sustained episodes of afib. The patient denies symptoms of  chest pain, shortness of breath, orthopnea, PND, lower extremity edema, dizziness, presyncope, syncope, or neurologic sequela. The patient is tolerating medications without difficulties and is otherwise without complaint today.   Current Medications (verified): 1)  Aspirin 81 Mg Tbec (Aspirin) .... Take One Tablet By Mouth Daily 2)  Glucotrol Xl 5 Mg Xr24h-Tab (Glipizide) .... Take 1 Tablet By Mouth Once A Day 3)  Diltiazem Hcl Er Beads 360 Mg Xr24h-Cap (Diltiazem Hcl Er Beads) .... Take One Capsule By Mouth Daily 4)  Potassium Chloride Crys Cr 20 Meq Cr-Tabs (Potassium Chloride Crys Cr) .... Take One Tablet By Mouth Once Daily 5)  Atenolol 50 Mg Tabs (Atenolol) .... Take 1 Tablet By Mouth Every Morning and 1/2 Tablet Every Evening. 6)  Lisinopril 10 Mg Tabs (Lisinopril) .... One By Mouth Once Daily 7)  Pravastatin Sodium 40 Mg Tabs (Pravastatin Sodium) .... Take 1 Tablet By Mouth Every Night 8)  Tikosyn 500 Mcg Caps (Dofetilide) .... Take 1 Tablet By Mouth Two Times A Day 9)  Coumadin 5 Mg Tabs (Warfarin Sodium) .... Take By Mouth As Directed By Anticoagulation Clinic 10)  Bumetanide 2 Mg Tabs (Bumetanide) .... Take 1 Tablet By Mouth Once A Day 11)  Nitroglycerin 0.4 Mg Subl (Nitroglycerin) .... Place 1 Tablet Under Tongue As Directed 12)  Onglyza 2.5 Mg Tabs (Saxagliptin Hcl) .... Once Daily  Allergies (verified): 1)  Codeine  Past History:  Past Medical History: Reviewed history from 04/04/2009 and no changes required.  1. Atrial  fibrillation  2. Atrial flutter previously documented on EKG without flutter ablation previously.   3. Coronary artery disease status post bare-metal stent of the left circumflex artery in September 2008.   4. Preserved ejection fraction.   5. Type 2 diabetes mellitus.   6. Hyperlipidemia.   7. Thyroid nodules per pt  Past Surgical History: Reviewed history from 03/19/2008 and no changes required. Ventral hernia repair Rotator Cuff Hysterectomy Lumpectomy  Social History: Reviewed history from 11/07/2008 and no changes required. Pt lives in [place] with spouse.   Retired from [employer]. Tobacco Use - No.  Alcohol Use - no  Review of Systems       All systems are reviewed and negative except as listed in the HPI.    Vital Signs:  Patient profile:   65 year old female Height:      63 inches Weight:      172 pounds BMI:     30.58 Pulse rate:   62 / minute BP sitting:   102 / 62  (left arm)  Vitals Entered By: Laurance Flatten CMA (May 01, 2009 9:09 AM)  Physical Exam  General:  Well developed, well nourished, in no acute distress. Head:  normocephalic and atraumatic Eyes:  PERRLA/EOM intact; conjunctiva and lids normal. Mouth:  Teeth, gums and palate normal. Oral mucosa normal. Neck:  Neck supple, no JVD. No masses, thyromegaly or abnormal cervical nodes. Lungs:  Clear bilaterally to auscultation and percussion. Heart:  Non-displaced PMI, chest non-tender; regular rate and rhythm, S1, S2 without murmurs, rubs or gallops. Carotid upstroke normal,  no bruit. Normal abdominal aortic size, no bruits. Femorals normal pulses, no bruits. Pedals normal pulses. No edema, no varicosities. Abdomen:  Bowel sounds positive; abdomen soft and non-tender without masses, organomegaly, or hernias noted. No hepatosplenomegaly. Msk:  Back normal, normal gait. Muscle strength and tone normal. Pulses:  pulses normal in all 4 extremities Neurologic:  Alert and oriented x 3. Skin:  Intact without  lesions or rashes. Cervical Nodes:  no significant adenopathy Psych:  Normal affect.   EKG  Procedure date:  05/01/2009  Findings:      sinus rhythm 64 bpm, QTc 447, LAD, otherwise normal ekg  Impression & Recommendations:  Problem # 1:  ATRIAL FIBRILLATION (ICD-427.31) The patient presents for EP follow-up of her afib. She reports palpitations on occasion, lasting 1-2 minutes.  She has an event monitor in place which has recorded PACs, nonsustained afib, and afib.  Her ventricular rates have improved (mostly 60s-100s during afib).   She is tolerating her medicines including her recently added atenolol. Her QT today appears stable.  We will follow her QT. I think that she would be a reasonably candidate for repeat cathter ablation.  She hopes to avoid catheter ablation.  Problem # 2:  CAD, UNSPECIFIED SITE (ICD-414.00) stable, without symptoms of ischemia. No changes today  Her updated medication list for this problem includes:    Aspirin 81 Mg Tbec (Aspirin) .Marland Kitchen... Take one tablet by mouth daily    Diltiazem Hcl Er Beads 360 Mg Xr24h-cap (Diltiazem hcl er beads) .Marland Kitchen... Take one capsule by mouth daily    Atenolol 50 Mg Tabs (Atenolol) .Marland Kitchen... Take 1 tablet by mouth every morning and 1/2 tablet every evening.    Lisinopril 10 Mg Tabs (Lisinopril) ..... One by mouth once daily    Coumadin 5 Mg Tabs (Warfarin sodium) .Marland Kitchen... Take by mouth as directed by anticoagulation clinic    Nitroglycerin 0.4 Mg Subl (Nitroglycerin) .Marland Kitchen... Place 1 tablet under tongue as directed  Problem # 3:  HYPERTENSION, UNSPECIFIED (ICD-401.9) stable no changes today  Her updated medication list for this problem includes:    Aspirin 81 Mg Tbec (Aspirin) .Marland Kitchen... Take one tablet by mouth daily    Diltiazem Hcl Er Beads 360 Mg Xr24h-cap (Diltiazem hcl er beads) .Marland Kitchen... Take one capsule by mouth daily    Atenolol 50 Mg Tabs (Atenolol) .Marland Kitchen... Take 1 tablet by mouth every morning and 1/2 tablet every evening.     Lisinopril 10 Mg Tabs (Lisinopril) ..... One by mouth once daily    Bumetanide 2 Mg Tabs (Bumetanide) .Marland Kitchen... Take 1 tablet by mouth once a day  Problem # 4:  HYPERLIPIDEMIA-MIXED (ICD-272.4) stable  Her updated medication list for this problem includes:    Pravastatin Sodium 40 Mg Tabs (Pravastatin sodium) .Marland Kitchen... Take 1 tablet by mouth every night  Other Orders: EKG w/ Interpretation (93000)  Patient Instructions: 1)  Your physician recommends that you schedule a follow-up appointment in: 3 months with Dr Johney Frame

## 2010-03-18 NOTE — Assessment & Plan Note (Signed)
Summary: leg edema LA   Visit Type:  Follow-up Primary Provider:  Cato Mulligan, MD   History of Present Illness: patient presents today for evaluation of peripheral edema.  She is concerned that this is a complication of her permanent pacemaker, which was implanted 2 months ago, by Dr. Johney Frame. She suggests it has worsened over the past 2 weeks.  She otherwise denies interim development of orthopnea, PND, DOE, tachypalpitations, or chest pain.  Preventive Screening-Counseling & Management  Alcohol-Tobacco     Smoking Status: never  Current Medications (verified): 1)  Aspirin 81 Mg Tbec (Aspirin) .... Take One Tablet By Mouth Daily 2)  Glipizide 10 Mg Tabs (Glipizide) .Marland Kitchen.. 1 Tablet in The Am 1/2 Tablet in Thepm 3)  Diltiazem Hcl Er Beads 360 Mg Xr24h-Cap (Diltiazem Hcl Er Beads) .... Take One Capsule By Mouth Daily 4)  Potassium Chloride Crys Cr 20 Meq Cr-Tabs (Potassium Chloride Crys Cr) .... Take One Tablet By Mouth Twice Daily 5)  Atenolol 50 Mg Tabs (Atenolol) .... Take 1 Tablet By Mouth Every Morning 6)  Pravastatin Sodium 40 Mg Tabs (Pravastatin Sodium) .... Take 1 Tablet By Mouth Every Night 7)  Tikosyn 500 Mcg Caps (Dofetilide) .... Take 1 Tablet By Mouth Two Times A Day 8)  Coumadin 5 Mg Tabs (Warfarin Sodium) .... Take By Mouth As Directed By Anticoagulation Clinic 9)  Bumetanide 2 Mg Tabs (Bumetanide) .... Take 1 Tablet By Mouth Two Times A Day 10)  Onglyza 5 Mg Tabs (Saxagliptin Hcl) .... Once Daily 11)  Losartan Potassium 25 Mg Tabs (Losartan Potassium) .... Take 1 Tablet By Mouth Once A Day 12)  Nitrostat 0.4 Mg Subl (Nitroglycerin) .Marland Kitchen.. 1 Tablet Under Tongue At Onset of Chest Pain; You May Repeat Every 5 Minutes For Up To 3 Doses.  Allergies: 1)  Codeine 2)  Metformin Hcl  Comments:  Nurse/Medical Assistant: The patient's medication list and allergies were reviewed with the patient and were updated in the Medication and Allergy Lists.  Past History:  Past  Medical History: Last updated: 07/17/2009  1. Atrial fibrillation  2. Atrial flutter previously documented on EKG without flutter ablation previously.   3. Coronary artery disease status post bare-metal stent of the left circumflex artery in September 2008.   4. Preserved ejection fraction.   5. Type 2 diabetes mellitus.   6. Hyperlipidemia.   7. Thyroid nodules per pt  8. Recurrent syncope  9. s/p PPM for tachybrady syndrome and syncope  Review of Systems       No fevers, chills, hemoptysis, dysphagia, melena, hematocheezia, hematuria, rash, claudication, orthopnea, pnd, pedal edema. All other systems negative.   Vital Signs:  Patient profile:   65 year old female Height:      63 inches Weight:      182 pounds Pulse rate:   83 / minute BP sitting:   112 / 72  (left arm) Cuff size:   regular  Vitals Entered By: Carlye Grippe (August 26, 2009 2:21 PM)  Physical Exam  Additional Exam:  GEN:65 year old female, sitting upright, in no distress HEENT: NCAT,PERRLA,EOMI NECK:  no JVD; no TM LUNGS: CTA bilaterally HEART: RRR (S1S2); no significant murmurs; no rubs; no gallops ABD: soft, NT; intact BS EXT: trace to 1+ bilateral, nonpitting edema SKIN: warm, dry MUSC: no obvious deformity NEURO: A/O (x3)     PPM Specifications Following MD:  Hillis Range, MD     PPM Vendor:  Medtronic     PPM Model Number:  [id number]  PPM Serial Number:  [id number] PPM DOI:  06/25/2009     PPM Implanting MD:  Hillis Range, MD  Lead 1    Location: RA     DOI: 06/25/2009     Model #: [id number]     Serial #: [id number]     Status: active Lead 2    Location: RV     DOI: 06/25/2009     Model #: 8413     Serial #: [id number]     Status: active  Magnet Response Rate:  BOL 85 ERI 65  Indications:  Tachy-brady syndrome   PPM Follow Up Pacer Dependent:  No      Episodes Coumadin:  Yes  Parameters Mode:  DDDR+     Lower Rate Limit:  60     Upper Rate Limit:  130 Paced AV Delay:  250     Sensed AV  Delay:  220  ILR Following MD Hillis Range, MD    Impression & Recommendations:  Problem # 1:  PERIPHERAL EDEMA (ICD-782.3)  patient presents with new onset, mild lower extremity edema, absent of signs or symptoms suggestive of congestive heart failure. Will increase current Bumex a.m. dose to 4 mg, and continue p.m. dose of 2 mg daily. Will check baseline metabolic profile today, with concurrent BNP level, and repeat a BMET in one week. We'll schedule followup Dr. Andee Lineman in one month, which will represent her recommended 6 month followup visit with him.  Problem # 2:  ATRIAL FIBRILLATION (ICD-427.31)  maintaining normal sinus rhythm by history and physical examination. On chronic Coumadin, followed in our clinic. Patient is also on Tikosyn. She is status post recent implantation of a dual-chamber pacemaker, for tachybradycardia syndrome/syncope, and is scheduled to see Dr. Johney Frame next month, as well.  Problem # 3:  CAD (ICD-414.00)  stable on current medication regimen, with no symptoms suggestive of unstable angina.  Problem # 4:  BRADYCARDIA-TACHYCARDIA SYNDROME (ICD-427.81)  status post recent permanent pacemaker implantation. Scheduled to follow with Dr. Johney Frame next month.  Other Orders: T-Basic Metabolic Panel [phone]) T-BNP  (B Natriuretic Peptide) [phone]) T-Basic Metabolic Panel [phone])  Patient Instructions: 1)  Follow up appt with Dr. Earnestine Leys on Wed, Sept 14, 2011 at 1:45pm. 2)  Keep appt with Dr. Johney Frame on September 27, 2009 at 11:30. 3)  Your physician recommends that you go to the [facility] for lab work: DO LABS TODAY AND IN 1 WEEK. 4)  Increase Bumex to 2mg  2 tablets every morning and 1 tablet every evening. Prescriptions: BUMETANIDE 2 MG TABS (BUMETANIDE) Take 2 tablets every morning and 1 tablet every evening.  #270 x 3   Entered by:   Cyril Loosen, RN, BSN   Authorized by:   Nelida Meuse, PA-C   Signed by:   Cyril Loosen, RN, BSN  on 08/26/2009   Method used:   Electronically to        [facility]* (mail-order)       [address]       [place], [place]  [address]       Ph: [phone]       Fax: [phone]   RxID:   [id number]

## 2010-03-18 NOTE — Progress Notes (Signed)
Summary: monitor question  Phone Note Call from Patient Call back at Home Phone [phone]   Summary of Call: Pt left message on voicemail stating she has received her monitor and she would like to know if she is suppose to call in every time she has a problem. Left message to call back on machine. Initial call taken by: Cyril Loosen, RN, BSN,  April 16, 2009 12:22 PM  Follow-up for Phone Call        Pt states she got it straightened out and knows what to do with monitor. Follow-up by: Cyril Loosen, RN, BSN,  April 18, 2009 4:23 PM

## 2010-03-18 NOTE — Progress Notes (Signed)
Summary: arm pain/cream  Phone Note Call from Patient Call back at [phone]   Caller: Patient Reason for Call: Talk to Nurse Summary of Call: pt wants some cream for arm pain... sore from pace maker, is there anything she can use from the drug store Initial call taken by: Migdalia Dk,  Jul 11, 2009 8:28 AM  Follow-up for Phone Call        pt calling again, request call back asap, in pain, Migdalia Dk  Jul 11, 2009 2:31 PM  spoke with her the hurt is up in shoulder and under arm .  She will try moist heat and call me back next Tues to give me results Dennis Bast, RN, BSN  Jul 11, 2009 5:39 PM

## 2010-03-18 NOTE — Letter (Signed)
Summary: Engineer, materials at [facility]  [address]   [place], [place] [address]   Phone: [phone]  Fax: [phone]        September 05, 2009 MRN: [id number]    [facility] [address] [place], [place]  [address]    Dear Ms. Damiani,  Your test ordered by Selena Batten has been reviewed by your physician (or physician assistant) and was found to be normal or stable. Your physician (or physician assistant) felt no changes were needed at this time.  ____ Echocardiogram  ____ Cardiac Stress Test  __X__ Lab Work-DONE 09/03/09  ____ Peripheral vascular study of arms, legs or neck  ____ CT scan or X-ray  ____ Lung or Breathing test  ____ Other:   Thank you.   Cyril Loosen, RN, BSN    Duane Boston, M.D., F.A.C.C. Thressa Sheller, M.D., F.A.C.C. Oneal Grout, M.D., F.A.C.C. Cheree Ditto, M.D., F.A.C.C. Daiva Nakayama, M.D., F.A.C.C. Kenney Houseman, M.D., F.A.C.C. Jeanne Ivan, PA-C

## 2010-03-18 NOTE — Progress Notes (Signed)
Summary: r/s surgery back to friday pt has a ride  Phone Note Call from Patient Call back at Home Phone [phone] Call back at [phone]   Caller: Patient Reason for Call: Talk to Nurse Summary of Call: pt  grand-daughter can take her to hospital on friday 4/15 . can you r.s back . Initial call taken by: Lorne Skeens,  May 30, 2009 11:02 AM  Follow-up for Phone Call        pt called and it has been rescheduled back to the original date of 05/31/09. Dennis Bast, RN, BSN  May 30, 2009 12:22 PM

## 2010-03-18 NOTE — Letter (Signed)
Summary: Engineer, materials at [facility]  [address]   [place], [place] [address]   Phone: [phone]  Fax: [phone]        April 04, 2009 MRN: [id number]   [facility] [address] [place], [place]  [address]   Dear Ms. Mikowski,  Your test ordered by Selena Batten has been reviewed by your physician (or physician assistant) and was found to be normal or stable. Your physician (or physician assistant) felt no changes were needed at this time.  ____ Echocardiogram  ____ Cardiac Stress Test  __X__ Lab Work  ____ Peripheral vascular study of arms, legs or neck  ____ CT scan or X-ray  ____ Lung or Breathing test  ____ Other:   Thank you.   Hoover Brunette, LPN    Duane Boston, M.D., F.A.C.C. Thressa Sheller, M.D., F.A.C.C. Oneal Grout, M.D., F.A.C.C. Cheree Ditto, M.D., F.A.C.C. Daiva Nakayama, M.D., F.A.C.C. Kenney Houseman, M.D., F.A.C.C. Jeanne Ivan, PA-C

## 2010-03-18 NOTE — Progress Notes (Signed)
Summary: pace maker appt  Phone Note Call from Patient Call back at Home Phone [phone] Call back at [phone]   Caller: Patient Summary of Call: per pt wanting to check to see if her appt has been set up for pace maker. Cell [phone]. pt will at a friends home the rest of the week.  Initial call taken by: Edman Circle,  Jun 19, 2009 8:58 AM  Follow-up for Phone Call        06/19/09--10am--pt's sister calling to inquire when pacemaker will be implanted--pt has loop recorder on now--next appoint is 08/05/09-pt's sister states she has been having syncope and they are anxious to get this done--thanks Follow-up by: Ledon Snare, RN,  Jun 19, 2009 9:52 AM

## 2010-03-18 NOTE — Miscellaneous (Signed)
Summary: Device preload  Clinical Lists Changes  Observations: Added new observation of PPM INDICATN: Tachy-brady syndrome (07/02/2009 13:10) Added new observation of MAGNET RTE: BOL 85 ERI 65 (07/02/2009 13:10) Added new observation of PPMLEADSTAT2: active (07/02/2009 13:10) Added new observation of PPMLEADSER2: [id number] (07/02/2009 13:10) Added new observation of PPMLEADMOD2: 5092  (07/02/2009 13:10) Added new observation of PPMLEADLOC2: RV  (07/02/2009 13:10) Added new observation of PPMLEADSTAT1: active  (07/02/2009 13:10) Added new observation of PPMLEADSER1: [id number]  (07/02/2009 13:10) Added new observation of PPMLEADMOD1: 5076  (07/02/2009 13:10) Added new observation of PPMLEADLOC1: RA  (07/02/2009 13:10) Added new observation of PPMLEADDOI2: 06/25/2009  (07/02/2009 13:10) Added new observation of PPMLEADDOI1: 06/25/2009  (07/02/2009 13:10) Added new observation of PPM IMP MD: Hillis Range, MD  (07/02/2009 13:10) Added new observation of PPM DOI: 06/25/2009  (07/02/2009 13:10) Added new observation of PPM SERL#: [id number]  (07/02/2009 13:10) Added new observation of PPM MODL#: [id number]  (07/02/2009 86:57) Added new observation of PACEMAKERMFG: Medtronic  (07/02/2009 13:10) Added new observation of PACEMAKER MD: Hillis Range, MD  (07/02/2009 13:10)      PPM Specifications Following MD:  Hillis Range, MD     PPM Vendor:  Medtronic     PPM Model Number:  [id number]     PPM Serial Number:  [id number] PPM DOI:  06/25/2009     PPM Implanting MD:  Hillis Range, MD  Lead 1    Location: RA     DOI: 06/25/2009     Model #: 8413     Serial #: [id number]     Status: active Lead 2    Location: RV     DOI: 06/25/2009     Model #: 3664     Serial #: [id number]     Status: active  Magnet Response Rate:  BOL 85 ERI 65  Indications:  Tachy-brady syndrome   ILR Following MD Hillis Range, MD

## 2010-03-18 NOTE — Medication Information (Signed)
Summary: ccr-lr  Anticoagulant Therapy  Managed by: Vashti Hey, RN PCP: Cato Mulligan, MD Supervising MD: Andee Lineman MD, Michelle Piper Indication 1: Atrial Fibrillation (ICD-427.31) Lab Used: [facility] Site: [place] INR POC 2.2  Dietary changes: no    Health status changes: no    Bleeding/hemorrhagic complications: no    Recent/future hospitalizations: no    Any changes in medication regimen? no    Recent/future dental: no  Any missed doses?: no       Is patient compliant with meds? yes       Allergies: 1)  Codeine  Anticoagulation Management History:      The patient is taking warfarin and comes in today for a routine follow up visit.  Positive risk factors for bleeding include an age of 67 years or older and presence of serious comorbidities.  The bleeding index is 'intermediate risk'.  Positive CHADS2 values include History of HTN and History of Diabetes.  Negative CHADS2 values include Age > 32 years old.  The start date was 12/01/2004.  Her last INR was 2.4 RATIO.  Anticoagulation responsible provider: Andee Lineman MD, Michelle Piper.  INR POC: 2.2.  Cuvette Lot#: [id number].  Exp: 10/11.    Anticoagulation Management Assessment/Plan:      The patient's current anticoagulation dose is Coumadin 5 mg tabs: Take by mouth as directed by Anticoagulation Clinic.  The target INR is 2 - 3.  The next INR is due 05/21/2009.  Anticoagulation instructions were given to patient.  Results were reviewed/authorized by Vashti Hey, RN.  She was notified by Vashti Hey RN.         Prior Anticoagulation Instructions: INR 1.7 Increase coumadin to 2.5mg  once daily except 5mg  on Mondays and Fridays  Current Anticoagulation Instructions: INR 2.2 Continue coumadin 2.5mg  once daily except 5mg  on Mondays and Fridays

## 2010-03-18 NOTE — Letter (Signed)
Summary: External Correspondence/ [facility]  External Correspondence/ [facility]   Imported By: Dorise Hiss 07/18/2009 10:01:15  _____________________________________________________________________  External Attachment:    Type:   Image     Comment:   External Document

## 2010-03-18 NOTE — Letter (Signed)
Summary: Implantable Device Instructions  Architectural technologist, Main Office  [address]   [place], [place] [address]   Phone: [phone]  Fax: [phone]      Implantable Device Instructions  You are scheduled for:  _____ Implantable Loop Recorder   on 05/31/09 with Dr. Johney Frame.  1.  Please arrive at the Short Stay Center at [facility] at 10:00am on the day of your procedure.  2.  Do not eat or drink after midnight the night before your procedure.  3.  Complete lab work on 05/29/09.   You do not have to be fasting.  4.  Do NOT take these medications for the am of your procedure:  Glipizide   5.  Plan for an overnight stay.  Bring your insurance cards and a list of your medications.  6.  Wash your chest and neck with antibacterial soap (any brand) the evening before and the morning of your procedure.  Rinse well.    *If you have ANY questions after you get home, please call the office [phone].  Anselm Pancoast  *Every attempt is made to prevent procedures from being rescheduled.  Due to the nauture of Electrophysiology, rescheduling can happen.  The physician is always aware and directs the staff when this occurs.

## 2010-03-18 NOTE — Miscellaneous (Signed)
Summary: medication change increase Atenolol  Clinical Lists Changes  Medications: Changed medication from ATENOLOL 50 MG TABS (ATENOLOL) Take 1/2 tablet by mouth every morning to ATENOLOL 50 MG TABS (ATENOLOL) Take 1 tablet by mouth every morning

## 2010-03-18 NOTE — Medication Information (Signed)
Summary: ccr-lr  Anticoagulant Therapy  Managed by: Vashti Hey, RN PCP: Cato Mulligan, MD Supervising MD: Diona Browner MD, Remi Deter Indication 1: Atrial Fibrillation (ICD-427.31) Lab [facility] Site: [place] INR POC 1.7  Dietary changes: no    Health status changes: no    Bleeding/hemorrhagic complications: no    Recent/future hospitalizations: no    Any changes in medication regimen? no    Recent/future dental: no  Any missed doses?: no       Is patient compliant with meds? yes       Allergies: 1)  Codeine  Anticoagulation Management History:      The patient is taking warfarin and comes in today for a routine follow up visit.  Positive risk factors for bleeding include an age of 67 years or older and presence of serious comorbidities.  The bleeding index is 'intermediate risk'.  Positive CHADS2 values include History of HTN and History of Diabetes.  Negative CHADS2 values include Age > 67 years old.  The start date was 12/01/2004.  Her last INR was 2.4 RATIO.  Anticoagulation responsible provider: Diona Browner MD, Remi Deter.  INR POC: 1.7.  Cuvette Lot#: [id number].  Exp: 10/11.    Anticoagulation Management Assessment/Plan:      The patient's current anticoagulation dose is Coumadin 5 mg tabs: Take by mouth as directed by Anticoagulation Clinic.  The target INR is 2 - 3.  The next INR is due 04/23/2009.  Anticoagulation instructions were given to patient.  Results were reviewed/authorized by Vashti Hey, RN.  She was notified by Vashti Hey RN.        Coagulation management information includes: started on tikosyn in hospital.  Prior Anticoagulation Instructions: INR 3.5 Hold coumadin tonight then decrease dose to 2.5mg  once daily except 5mg  on Mondays  Current Anticoagulation Instructions: INR 1.7 Increase coumadin to 2.5mg  once daily except 5mg  on Mondays and Fridays

## 2010-03-18 NOTE — Medication Information (Signed)
Summary: ccr-lr  Anticoagulant Therapy  Managed by: Vashti Hey, RN PCP: Cato Mulligan, MD Supervising MD: Andee Lineman MD, Michelle Piper Indication 1: Atrial Fibrillation (ICD-427.31) Lab [facility] [place] Site: [place] INR POC 3.1  Dietary changes: no    Health status changes: no    Bleeding/hemorrhagic complications: no    Recent/future hospitalizations: no    Any changes in medication regimen? no    Recent/future dental: no  Any missed doses?: no       Is patient compliant with meds? yes       Allergies: 1)  Codeine 2)  Metformin Hcl  Anticoagulation Management History:      The patient is taking warfarin and comes in today for a routine follow up visit.  Positive risk factors for bleeding include an age of 69 years or older and presence of serious comorbidities.  The bleeding index is 'intermediate risk'.  Positive CHADS2 values include History of HTN and History of Diabetes.  Negative CHADS2 values include Age > 90 years old.  The start date was 12/01/2004.  Her last INR was 2.4 ratio.  Anticoagulation responsible provider: Andee Lineman MD, Michelle Piper.  INR POC: 3.1.  Cuvette Lot#: [id number].  Exp: 10/11.    Anticoagulation Management Assessment/Plan:      The patient's current anticoagulation dose is Coumadin 5 mg tabs: Take by mouth as directed by Anticoagulation Clinic.  The target INR is 2 - 3.  The next INR is due 08/27/2009.  Anticoagulation instructions were given to patient.  Results were reviewed/authorized by Vashti Hey, RN.  She was notified by Vashti Hey RN.         Prior Anticoagulation Instructions: INR 3.0 Take coumadin 1/2 tablet tonight then resume 2.5mg  once daily except 5mg  on M,W,F  Current Anticoagulation Instructions: INR 3.1 Hold coumadin tonight then resume 2.5mg  once daily except 5mg  on M,W,F

## 2010-03-18 NOTE — Progress Notes (Signed)
Summary: Patient's at Home Vitals  Patient's at Home Vitals   Imported By: Marylou Mccoy 07/26/2009 14:13:22  _____________________________________________________________________  External Attachment:    Type:   Image     Comment:   External Document

## 2010-03-18 NOTE — Procedures (Signed)
Summary: Holter and Event/ CARDIO NET ACTIVITY REPORT  Holter and Event/ CARDIO NET ACTIVITY REPORT   Imported By: Dorise Hiss 05/02/2009 15:45:52  _____________________________________________________________________  External Attachment:    Type:   Image     Comment:   External Document

## 2010-03-18 NOTE — Progress Notes (Signed)
Summary: black out spell  Phone Note Call from Patient Call back at cell:  [phone]   Summary of Call: Had spell on Saturday while driving.  Blacked out, maybe only lasted a couple seconds.  Has not driven since.  Felt fine since and no black out spells.  No lightheaded feelings either.  145/80  107  this a.m. before meds.   Saturday 91/54  54  after episode after getting back home.  95/48  50  later that evening.    Sunday   121/64  63.      89 /49  46  later that evening .  Please advise as Dr. Andee Lineman is [place] of the country.    Initial call taken by: Hoover Brunette, LPN,  May 20, 2009 10:32 AM  Follow-up for Phone Call        Pt. came by office this morning wanting to know if we had gotten reply on above message.  Advised pt. that I would forward again.  Hoover Brunette, LPN  May 22, 4538 10:07 AM  Spoke with DR Allred.  He will see pt on Friday in [place].   Called pt and offered apt.  She is going out of town and will not be in [place] on Friday and wanted to know if we could see her in [place] office sometime next week.  She is leaving to go out of town tomorrow.  Will ask Dr Johney Frame if he can come in early to see the pt and call her back tomorrow. Dennis Bast, RN, BSN  May 21, 2009 6:26 PM  Additional Follow-up for Phone Call Additional follow up Details #1::        Spoke with patient.  Appt wed 4-13 with Dr Johney Frame in [place] office.  Pt advised no driving until after seen. Gypsy Balsam RN BSN  May 24, 2009 5:22 PM

## 2010-03-18 NOTE — Progress Notes (Signed)
Summary: QUESTIONS ABOUT WHAT MED TO TAKE BEFORE PROCEDURE  Phone Note Call from Patient Call back at Home Phone [phone]   Caller: Patient Summary of Call: PT CALLING ABOUT PROCEDURE AND MEDICATION  Initial call taken by: Judie Grieve,  May 29, 2009 4:57 PM  Follow-up for Phone Call        per pt calling back, wants to change procedure date. [phone] Lorne Skeens  May 30, 2009 8:09 AM  moved to the 26th  be at hospital at [facility] labs morning of at hospital pt aware Dennis Bast, RN, BSN  May 30, 2009 10:24 AM

## 2010-03-18 NOTE — Assessment & Plan Note (Signed)
Summary: pt to see amber only/lg   Visit Type:  wound check Primary Provider:  Cato Mulligan, MD  CC:  wound check.  History of Present Illness: The patient presents today for routine electrophysiology followup after her recent ILR placement.  She reports having an episode of profound presyncope for which she activated her monitor.  The patient denies symptoms of palpitations, chest pain, shortness of breath, orthopnea, PND, lower extremity edema, dizziness,or neurologic sequela. The patient is tolerating medications without difficulties and is otherwise without complaint today.   Preventive Screening-Counseling & Management  Alcohol-Tobacco     Smoking Status: never  Current Medications (verified): 1)  Aspirin 81 Mg Tbec (Aspirin) .... Take One Tablet By Mouth Daily 2)  Glipizide 10 Mg Tabs (Glipizide) .Marland Kitchen.. 1 Tablet in The Am 1/2 Tablet in Thepm 3)  Diltiazem Hcl Er Beads 360 Mg Xr24h-Cap (Diltiazem Hcl Er Beads) .... Take One Capsule By Mouth Daily 4)  Potassium Chloride Crys Cr 20 Meq Cr-Tabs (Potassium Chloride Crys Cr) .... Take One Tablet By Mouth Twice Daily 5)  Atenolol 50 Mg Tabs (Atenolol) .... Take 1/2 Tablet By Mouth Every Morning 6)  Pravastatin Sodium 40 Mg Tabs (Pravastatin Sodium) .... Take 1 Tablet By Mouth Every Night 7)  Tikosyn 500 Mcg Caps (Dofetilide) .... Take 1 Tablet By Mouth Two Times A Day 8)  Coumadin 5 Mg Tabs (Warfarin Sodium) .... Take By Mouth As Directed By Anticoagulation Clinic 9)  Bumetanide 2 Mg Tabs (Bumetanide) .... Take 1 Tablet By Mouth Once A Day 10)  Onglyza 5 Mg Tabs (Saxagliptin Hcl) .... Once Daily 11)  Losartan Potassium 25 Mg Tabs (Losartan Potassium) .... Take 1 Tablet By Mouth Once A Day  Allergies (verified): 1)  Codeine  Past History:  Past Medical History: Reviewed history from 05/29/2009 and no changes required.  1. Atrial fibrillation  2. Atrial flutter previously documented on EKG without flutter ablation previously.   3.  Coronary artery disease status post bare-metal stent of the left circumflex artery in September 2008.   4. Preserved ejection fraction.   5. Type 2 diabetes mellitus.   6. Hyperlipidemia.   7. Thyroid nodules per pt  8. Recurrent syncope  Past Surgical History: Reviewed history from 03/19/2008 and no changes required. Ventral hernia repair Rotator Cuff Hysterectomy Lumpectomy  Social History: Reviewed history from 11/07/2008 and no changes required. Pt lives in [place] with spouse.   Retired from [employer]. Tobacco Use - No.  Alcohol Use - no  Vital Signs:  Patient profile:   65 year old female Height:      63 inches Weight:      176 pounds Pulse rate:   52 / minute BP sitting:   174 / 82  (left arm) Cuff size:   regular  Vitals Entered By: Carlye Grippe (June 14, 2009 10:11 AM) CC: wound check   Physical Exam  General:  Well developed, well nourished, in no acute distress. Head:  normocephalic and atraumatic Eyes:  PERRLA/EOM intact; conjunctiva and lids normal. Mouth:  Teeth, gums and palate normal. Oral mucosa normal. Neck:  Neck supple, no JVD. No masses, thyromegaly or abnormal cervical nodes. Chest Wall:  ILR site is healing nicely Lungs:  Clear bilaterally to auscultation and percussion. Heart:  Non-displaced PMI, chest non-tender; regular rate and rhythm, S1, S2 without murmurs, rubs or gallops. Carotid upstroke normal, no bruit. Normal abdominal aortic size, no bruits. Femorals normal pulses, no bruits. Pedals normal pulses. No edema, no varicosities. Abdomen:  Bowel sounds positive; abdomen soft and non-tender without masses, organomegaly, or hernias noted. No hepatosplenomegaly. Msk:  Back normal, normal gait. Muscle strength and tone normal. Pulses:  pulses normal in all 4 extremities Extremities:  No clubbing or cyanosis. Neurologic:  Alert and oriented x 3.    ILR Following MD Hillis Range, MD    Tech Comments:  one symptom activated event where  the patient c/o "being lightheaded", showed a 4 second pause and other pauses in the same episode not a long,2-3 seconds.   Steri strips removed.  No redness or edema noted. Altha Harm, LPN  June 14, 2009 10:30 AM   MD Comments:  4.5 second pause correlates with symptoms of profound presyncope  Impression & Recommendations:  Problem # 1:  SYNCOPE (ICD-780.2) Recent event monitor was unrevealing.  ILR has now shown >4 second pause with symptoms of presyncope. Risks, benefits, alternatives to PPM implantation were discussed in detail with the patient today.  She understands that the risks include but are not limited to bleeding, infection, pneumothorax, perforation, tamponade, vascular damage, renal failure, MI, stroke, death, and lead dislodgement.  She accepts these risks and wishes to proceed.  NO driving in the interim.  Problem # 2:  ATRIAL FIBRILLATION (ICD-427.31) stable no changes

## 2010-03-18 NOTE — Medication Information (Signed)
Summary: ccr-at MD appt-lr  Anticoagulant Therapy  Managed by: Vashti Hey, RN PCP: Cato Mulligan, MD Supervising MD: Diona Browner MD, Remi Deter Indication 1: Atrial Fibrillation (ICD-427.31) [facility]  Site: [place] INR POC 2.7  Dietary changes: no    Health status changes: no    Bleeding/hemorrhagic complications: no    Recent/future hospitalizations: no    Any changes in medication regimen? no    Recent/future dental: no  Any missed doses?: no       Is patient compliant with meds? yes       Allergies: 1)  Codeine  Anticoagulation Management History:      The patient is taking warfarin and comes in today for a routine follow up visit.  Positive risk factors for bleeding include an age of 68 years or older and presence of serious comorbidities.  The bleeding index is 'intermediate risk'.  Positive CHADS2 values include History of HTN and History of Diabetes.  Negative CHADS2 values include Age > 51 years old.  The start date was 12/01/2004.  Her last INR was 2.4 ratio.  Anticoagulation responsible provider: Diona Browner MD, Remi Deter.  INR POC: 2.7.  Cuvette Lot#: [id number].  Exp: 10/11.    Anticoagulation Management Assessment/Plan:      The patient's current anticoagulation dose is Coumadin 5 mg tabs: Take by mouth as directed by Anticoagulation Clinic.  The target INR is 2 - 3.  The next INR is due 07/09/2009.  Anticoagulation instructions were given to patient.  Results were reviewed/authorized by Vashti Hey, RN.  She was notified by Vashti Hey RN.         Prior Anticoagulation Instructions: INR 1.9 Increase coumadin to 2.5mg  once daily except 5mg  on M,W,F  Current Anticoagulation Instructions: INR 2.7 Continue coumadin 2.5mg  once daily except 5mg  on M,W,F

## 2010-03-18 NOTE — Letter (Signed)
Summary: Implantable Device Instructions  Architectural technologist, Main Office  [address]   [place], [place] [address]   Phone: [phone]  Fax: [phone]      Implantable Device Instructions  You are scheduled for:  _____ Permanent Transvenous Pacemaker   on 06/25/09 with Dr. Johney Frame.  1.  Please arrive at the Short Stay Center at [facility] at 5:30am on the day of your procedure.  2.  Do not eat or drinkafter midnight the night before your procedure.  3.  Complete lab work on 06/24/09 at [facility].  You do not have to be fasting.  4.  Do NOT take these medications for the morning of your procedure:  Glipizide,Onglyza,and Bumex.  Take your last dose of Coumadin on --will call if you need to hold.  5.  Plan for an overnight stay.  Bring your insurance cards and a list of your medications.  6.  Wash your chest and neck with antibacterial soap (any brand) the evening before and the morning of your procedure.  Rinse well.   *If you have ANY questions after you get home, please call the office [phone].  Anselm Pancoast  *Every attempt is made to prevent procedures from being rescheduled.  Due to the nauture of Electrophysiology, rescheduling can happen.  The physician is always aware and directs the staff when this occurs.

## 2010-03-18 NOTE — Consult Note (Signed)
Summary: CARDIOLOGY CONSULT/ [facility]  CARDIOLOGY CONSULT/ [facility]   Imported By: Zachary George 03/26/2009 10:05:51  _____________________________________________________________________  External Attachment:    Type:   Image     Comment:   External Document

## 2010-03-18 NOTE — Cardiovascular Report (Signed)
Summary: Pre Op Orders  Pre Op Orders   Imported By: Roderic Ovens 06/03/2009 11:31:16  _____________________________________________________________________  External Attachment:    Type:   Image     Comment:   External Document

## 2010-03-18 NOTE — Medication Information (Signed)
Summary: ccr-lr  Anticoagulant Therapy  Managed by: Vashti Hey, RN PCP: Cato Mulligan, MD Supervising MD: Andee Lineman MD, Michelle Piper Indication 1: Atrial Fibrillation (ICD-427.31) Lab [facility] [place] Site: [place] INR POC 2.5  Dietary changes: no    Health status changes: no    Bleeding/hemorrhagic complications: no    Recent/future hospitalizations: no    Any changes in medication regimen? no    Recent/future dental: no  Any missed doses?: no       Is patient compliant with meds? yes       Allergies: 1)  Codeine  Anticoagulation Management History:      The patient is taking warfarin and comes in today for a routine follow up visit.  Positive risk factors for bleeding include an age of 35 years or older and presence of serious comorbidities.  The bleeding index is 'intermediate risk'.  Positive CHADS2 values include History of HTN and History of Diabetes.  Negative CHADS2 values include Age > 48 years old.  The start date was 12/01/2004.  Her last INR was 2.4 RATIO.  Anticoagulation responsible provider: Andee Lineman MD, Michelle Piper.  INR POC: 2.5.  Cuvette Lot#: [id number].  Exp: 10/11.    Anticoagulation Management Assessment/Plan:      The patient's current anticoagulation dose is Coumadin 5 mg tabs: Take by mouth as directed by Anticoagulation Clinic.  The target INR is 2 - 3.  The next INR is due 04/09/2009.  Anticoagulation instructions were given to patient.  Results were reviewed/authorized by Vashti Hey, RN.  She was notified by Vashti Hey RN.         Prior Anticoagulation Instructions: INR 2.4 Continue coumadin 2.5mg  once daily except 5mg  on Mondays and Fridays  Current Anticoagulation Instructions: INR 2.5 Continue coumadin 2.5mg  once daily except 5mg  on Mondays and Fridays

## 2010-03-18 NOTE — Medication Information (Signed)
Summary: ccr-lr  Anticoagulant Therapy  Managed by: Vashti Hey, RN PCP: Cato Mulligan, MD Supervising MD: Myrtis Ser MD, Tinnie Gens Indication 1: Atrial Fibrillation (ICD-427.31) Lab [facility] [place] Site: [place] INR POC 3.0  Dietary changes: no    Health status changes: no    Bleeding/hemorrhagic complications: no    Recent/future hospitalizations: no    Any changes in medication regimen? no    Recent/future dental: no  Any missed doses?: no       Is patient compliant with meds? yes       Allergies: 1)  Codeine 2)  Metformin Hcl  Anticoagulation Management History:      The patient is taking warfarin and comes in today for a routine follow up visit.  Positive risk factors for bleeding include an age of 67 years or older and presence of serious comorbidities.  The bleeding index is 'intermediate risk'.  Positive CHADS2 values include History of HTN and History of Diabetes.  Negative CHADS2 values include Age > 83 years old.  The start date was 12/01/2004.  Her last INR was 2.4 ratio.  Anticoagulation responsible provider: Myrtis Ser MD, Tinnie Gens.  INR POC: 3.0.  Cuvette Lot#: [id number].  Exp: 10/11.    Anticoagulation Management Assessment/Plan:      The patient's current anticoagulation dose is Coumadin 5 mg tabs: Take by mouth as directed by Anticoagulation Clinic.  The target INR is 2 - 3.  The next INR is due 12/17/2009.  Anticoagulation instructions were given to patient.  Results were reviewed/authorized by Vashti Hey, RN.  She was notified by Vashti Hey RN.         Prior Anticoagulation Instructions: INR 2.8 Continue coumadin 2.5mg  once daily except 5mg  on M,W,F  Current Anticoagulation Instructions: INR 3.0 Continue coumadin 2.5mg  once daily except 5mg  on M,W,F

## 2010-03-18 NOTE — Procedures (Signed)
Summary: Holter and Event/ [facility] PATIENT ACTIVITY REPORT  Holter and Event/ [facility] PATIENT ACTIVITY REPORT   Imported By: Dorise Hiss 07/16/2009 10:07:04  _____________________________________________________________________  External Attachment:    Type:   Image     Comment:   External Document

## 2010-03-18 NOTE — Cardiovascular Report (Signed)
Summary: Card Device Clinic/ INTERROGATION QUICK LOOK  Card Device Clinic/ INTERROGATION QUICK LOOK   Imported By: Dorise Hiss 06/17/2009 11:04:21  _____________________________________________________________________  External Attachment:    Type:   Image     Comment:   External Document

## 2010-03-18 NOTE — Progress Notes (Signed)
Summary: Monitor question  Phone Note Call from [name] [name]   Caller: [name] [name] Summary of Call: Pt called stating Dr. Earnestine Leys wanted a heart monitor. She states she s/w [employer] company today and was told that monitor will be $147 for her part. She states she can't afford this. Spoke with rep at [employer] to confirm if $147 was for [facility] or Event. Per [employer] rep, this is for [employer]. Notified to change to Event. They state this will be less expensive but can't give me an exact number. Pt is aware of this change and will notify me if she cannot afford Event once [employer] notifies her of the new cost. She is aware she is responsible for 80%. Initial call taken by: Cyril Loosen, RN, BSN,  April 02, 2009 4:34 PM

## 2010-03-20 NOTE — Medication Information (Signed)
Summary: ccr-lr  Anticoagulant Therapy  Managed by: Vashti Hey, RN PCP: Cato Mulligan, MD Supervising MD: Diona Browner MD, Remi Deter Indication 1: Atrial Fibrillation (ICD-427.31) Lab [facility] [place] Site: [place] INR POC 2.4  Dietary changes: no    Health status changes: no    Bleeding/hemorrhagic complications: no    Recent/future hospitalizations: no    Any changes in medication regimen? no    Recent/future dental: no  Any missed doses?: no       Is patient compliant with meds? yes       Allergies: 1)  Codeine 2)  Metformin Hcl  Anticoagulation Management History:      The patient is taking warfarin and comes in today for a routine follow up visit.  Positive risk factors for bleeding include an age of 65 years or older and presence of serious comorbidities.  The bleeding index is 'intermediate risk'.  Positive CHADS2 values include History of HTN and History of Diabetes.  Negative CHADS2 values include Age > 25 years old.  The start date was 12/01/2004.  Her last INR was 2.4 ratio.  Anticoagulation responsible provider: Diona Browner MD, Remi Deter.  INR POC: 2.4.  Cuvette Lot#: [id number].  Exp: 10/11.    Anticoagulation Management Assessment/Plan:      The patient's current anticoagulation dose is Coumadin 5 mg tabs: Take by mouth as directed by Anticoagulation Clinic.  The target INR is 2 - 3.  The next INR is due 02/18/2010.  Anticoagulation instructions were given to patient.  Results were reviewed/authorized by Vashti Hey, RN.  She was notified by Vashti Hey RN.         Prior Anticoagulation Instructions: INR 2.8 Continue couamdin 2.5mg  once daily except 5mg  on M,W,F  Current Anticoagulation Instructions: INR 2.4 Continue coumadin 2.5mg  once daily except 5mg  on M,W,F

## 2010-03-27 ENCOUNTER — Encounter: Payer: Self-pay | Admitting: Cardiology

## 2010-03-27 ENCOUNTER — Encounter (INDEPENDENT_AMBULATORY_CARE_PROVIDER_SITE_OTHER): Payer: MEDICARE

## 2010-03-27 DIAGNOSIS — I10 Essential (primary) hypertension: Secondary | ICD-10-CM

## 2010-03-27 DIAGNOSIS — R609 Edema, unspecified: Secondary | ICD-10-CM

## 2010-03-27 DIAGNOSIS — I4891 Unspecified atrial fibrillation: Secondary | ICD-10-CM

## 2010-03-28 ENCOUNTER — Encounter: Payer: Self-pay | Admitting: Cardiology

## 2010-03-28 ENCOUNTER — Telehealth (INDEPENDENT_AMBULATORY_CARE_PROVIDER_SITE_OTHER): Payer: Self-pay | Admitting: *Deleted

## 2010-04-03 NOTE — Progress Notes (Signed)
Summary: edema      Phone Note Call from Patient Call back at cell:  [phone]   Summary of Call: Continuing to have edema in feet and lower extremitity. Worse by end of day. Dr. Leandrew Koyanagi told her to stop taking the evening dose of Bumetanide since it was running her to bathroom, but not making edema go down.  Also, question the Diltiazem causing this.  Will be leaving to go out of town tomorrow & be gone for about 3 days.   Initial call taken by: Hoover Brunette, LPN,  March 17, 2010 9:03 AM  Follow-up for Phone Call        suggest to wear compression stockings may be because of standing and therefore symptoms worsened on the day Follow-up by: Lewayne Bunting, MD, [facility],  March 20, 2010 7:54 AM  Additional Follow-up for Phone Call Additional follow up Details #1::        Left message to return call.  Hoover Brunette, LPN  March 20, 2010 1:40 PM   States she is wearing them every other day or so.  Really doesn't matter, staying swollen with or without the stockings.  Any other suggustions?  If calling back tomorrow, please call:  [phone]. Additional Follow-up by: Hoover Brunette, LPN,  March 20, 2010 3:37 PM    Additional Follow-up for Phone Call Additional follow up Details #2::    RN visit and lets look at it Follow-up by: Lewayne Bunting, MD, [facility],  March 23, 2010 6:56 PM  Additional Follow-up for Phone Call Additional follow up Details #3:: Details for Additional Follow-up Action Taken: Cell phone cutting in and out.  Patient will call back when she can.   Hoover Brunette, LPN  March 24, 2010 11:00 AM   [place] visit scheduled for Thursday, 2/9 at 9:15. Hoover Brunette, LPN  March 24, 2010 12:15 PM

## 2010-04-09 ENCOUNTER — Encounter: Payer: Self-pay | Admitting: Cardiology

## 2010-04-09 NOTE — Medication Information (Signed)
Summary: Coumadin Clinic  Anticoagulant Therapy  Managed by: Vashti Hey, RN PCP: Cato Mulligan, MD Supervising MD: Andee Lineman MD, Michelle Piper Indication 1: Atrial Fibrillation (ICD-427.31) Lab [facility]: [facility] Site: [place] INR POC 2.7  Dietary changes: no    Health status changes: no    Bleeding/hemorrhagic complications: no    Recent/future hospitalizations: no    Any changes in medication regimen? no    Recent/future dental: no  Any missed doses?: no       Is patient compliant with meds? yes       Allergies: 1)  Codeine 2)  Metformin Hcl  Anticoagulation Management History:      The patient is taking warfarin and comes in today for a routine follow up visit.  Positive risk factors for bleeding include an age of 32 years or older and presence of serious comorbidities.  The bleeding index is 'intermediate risk'.  Positive CHADS2 values include History of HTN and History of Diabetes.  Negative CHADS2 values include Age > 2 years old.  The start date was 12/01/2004.  Her last INR was 2.4 ratio.  Anticoagulation responsible provider: Andee Lineman MD, Michelle Piper.  INR POC: 2.7.  Cuvette Lot#: [id number].  Exp: 10/11.    Anticoagulation Management Assessment/Plan:      The patient's current anticoagulation dose is Coumadin 5 mg tabs: Take by mouth as directed by Anticoagulation Clinic.  The target INR is 2 - 3.  The next INR is due 05/02/2010.  Anticoagulation instructions were given to patient.  Results were reviewed/authorized by Vashti Hey, RN.  She was notified by Vashti Hey RN.         Prior Anticoagulation Instructions: INR 2.4 Continue coumadin 2.5mg  once daily except 5mg  on M,W,F  Current Anticoagulation Instructions: INR 2.7 Continue coumadin 2.5mg  once daily except 5mg  on M,W,F

## 2010-04-09 NOTE — Assessment & Plan Note (Signed)
Summary: bp check, GD need to check edema in feet/ankles  --agh  Nurse Visit   Vital Signs:  Patient profile:   65 year old female Height:      63 inches Weight:      192 pounds Pulse rate:   69 / minute BP sitting:   115 / 75  (left arm) Cuff size:   large  Vitals Entered By: Carlye Grippe (March 27, 2010 9:45 AM) CC: bp check,check edema in BLE   Past History:  Past Medical History: Last updated: 07/17/2009  1. Atrial fibrillation  2. Atrial flutter previously documented on EKG without flutter ablation previously.   3. Coronary artery disease status post bare-metal stent of the left circumflex artery in September 2008.   4. Preserved ejection fraction.   5. Type 2 diabetes mellitus.   6. Hyperlipidemia.   7. Thyroid nodules per pt  8. Recurrent syncope  9. s/p PPM for tachybrady syndrome and syncope   Visit Type:  nurse visit Primary Provider:  Cato Mulligan, MD  CC:  bp check and check edema in BLE.  History of Present Illness: The patient presents for a nurse visit to followup on her blood pressure periods the patient has a history of atrial flutter and is status post ablation as well as Atrial fibrillationAnd  underwent pulmonary vein isolation. 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 From a cardiovascular perspective she has otherwise been doing well. She reports no chest pain or shortness of breath.    Impression & Recommendations:  Problem # 1:  PERIPHERAL EDEMA (ICD-782.3) The patient has non pitting edema secondary to varicosities of the lower extremities. Particulate a varicosities in the left leg or severe periods the patient will be referred to vascular surgery. We will also order venous Dopplers of the lower extremities  Orders: Est. Patient Level I ([address]) Venous Duplex Lower Extremity  (Venous Dup Lower E)Future Orders: T-CBC No Diff ([id number]) ... 04/24/2010 T-Basic Metabolic Panel [phone]) ... 04/24/2010  Problem # 2:  ATRIAL FIBRILLATION (ICD-427.31) The patient wants to switch to her rivaroxaban.She will check with her insurance carrier it is will be paid for periods is so she can start on 20 mg p.o. Q daily.  Her updated medication list for this problem includes:    Aspirin 81 Mg Tbec (Aspirin) .Marland Kitchen... Take one tablet by mouth daily    Atenolol 50 Mg Tabs (Atenolol) .Marland Kitchen... Take 1 tablet by mouth every morning    Tikosyn 500 Mcg Caps (Dofetilide) .Marland Kitchen... Take 1 tablet by mouth two times a day    Coumadin 5 Mg Tabs (Warfarin sodium) .Marland Kitchen... Take by mouth as directed by anticoagulation clinic    Physical Exam  Additional Exam:  Physical exam was not performed short of an examination of the lower extremities to check for pulses which were intact in examination of nonpitting edema    Preventive Screening-Counseling & Management  Alcohol-Tobacco     Smoking Status: never  Current Medications (verified): 1)  Aspirin 81 Mg Tbec (Aspirin) .... Take One Tablet By Mouth Daily 2)  Glipizide 10 Mg Tabs (Glipizide) .Marland Kitchen.. 1 Tablet in The Am 1/2 Tablet in Thepm 3)  Diltiazem Hcl Er Beads 360 Mg Xr24h-Cap (Diltiazem Hcl Er Beads) .... Take One Capsule By Mouth Daily 4)  Potassium Chloride Crys Cr 20 Meq Cr-Tabs (Potassium Chloride Crys Cr) .Marland KitchenMarland KitchenMarland Kitchen  Take One Tablet By Mouth Twice Daily 5)  Atenolol 50 Mg Tabs (Atenolol) .... Take 1 Tablet By Mouth Every Morning 6)  Pravastatin Sodium 40 Mg Tabs (Pravastatin Sodium) .... Take 1 Tablet By Mouth Every Night 7)  Tikosyn 500 Mcg Caps (Dofetilide) .... Take 1 Tablet By Mouth Two Times A Day 8)  Coumadin 5 Mg Tabs (Warfarin Sodium) .... Take By Mouth As Directed By Anticoagulation Clinic 9)  Bumetanide 2 Mg Tabs (Bumetanide) .... Take 2 Tablets Every Morning and 1 Tablet Every Evening. 10)  Onglyza 5 Mg Tabs (Saxagliptin Hcl) .... Once  Daily 11)  Nitrostat 0.4 Mg Subl (Nitroglycerin) .Marland Kitchen.. 1 Tablet Under Tongue At Onset of Chest Pain; You May Repeat Every 5 Minutes For Up To 3 Doses. 12)  Bilateral Knee High Compression Stockings - Low Pressure .... Use As Directed 13)  Meclizine Hcl 25 Mg Tabs (Meclizine Hcl) .... Take 1 Tablet Three Times A Day 14)  Diazepam 5 Mg Tabs (Diazepam) .... Take 1 Tablet By Mouth Three Times A Day As Needed and One At Bedtime  Allergies (verified): 1)  Codeine 2)  Metformin Hcl  Comments:  Nurse/Medical Assistant: The patient's medication list and allergies were reviewed with the patient and were updated in the Medication and Allergy Lists.  Orders Added: 1)  Est. Patient Level I [[address]] 2)  T-CBC No Diff [[id number]] 3)  T-Basic Metabolic Panel [[id number]] 4)  Venous Duplex Lower Extremity [Venous Dup Lower E]   Patient Instructions: 1)  Labs:  CBC, BMET in 4 weeks 2)  Info given on Rivaroxaban 3)  Referral to [facility] 4)  Venous Doppler 5)  Follow up in  1 month

## 2010-04-15 NOTE — Miscellaneous (Signed)
Summary: Orders Update  Clinical Lists Changes  Orders: Added new Referral order of [facility] Referral ([facility] Ref) - Signed 

## 2010-04-18 ENCOUNTER — Telehealth (INDEPENDENT_AMBULATORY_CARE_PROVIDER_SITE_OTHER): Payer: Self-pay | Admitting: *Deleted

## 2010-04-22 ENCOUNTER — Encounter: Payer: Self-pay | Admitting: Cardiology

## 2010-04-24 ENCOUNTER — Encounter: Payer: Self-pay | Admitting: Cardiology

## 2010-04-24 NOTE — Letter (Signed)
Summary: Internal Correspondence/ FAXED VASCULAR & VEIN  Internal Correspondence/ FAXED [facility]ASCULAR & VEIN   Imported By: Dorise Hiss 04/15/2010 14:22:03  _____________________________________________________________________  External Attachment:    Type:   Image     Comment:   External Document

## 2010-04-24 NOTE — Progress Notes (Signed)
Summary: PLEASE CALL R/E NEW MEDICATION  Phone Note Call from Patient Call back at Home Phone [phone] Call back at [phone]   Caller: Patient Call For: NURSE Summary of Call: message left on nurse voicemail to call her next week and advised her when she is supposed to start her new medication. Initial call taken by: Carlye Grippe,  March 28, 2010 3:20 PM  Follow-up for Phone Call        When did you intend for patient to start Rivaroxaban?  Hoover Brunette, LPN  March 31, 2010 5:30 PM   Additional Follow-up for Phone Call Additional follow up Details #1::        I suspect in new medication we're talking about is Clide Deutscher.I would at least a handled as in the Coumadin clinic. Patient is to stop Coumadin first and Xarelta cannot be started until I and our level less than 2. Make sure to medication is giving within the evening meal periods To dose is 20 Milligrams  p.o. q day.  Additional Follow-up by: Lewayne Bunting, MD, [facility],  April 07, 2010 8:24 AM    Additional Follow-up for Phone Call Additional follow up Details #2::    Patient has OV with GD for 3/16.  Was going to have coumadin checked same day but wanted to wait till 3/23.  Have scheduled for this day with Misty Stanley at 8:30 a.m.  Also, sent rx in for her Xarelto 20mg  daily to [facility] as requested by patient.  How many days does she need to stop coumadin prior to this check so she can begin new med?   Follow-up by: Hoover Brunette, LPN,  April 09, 2010 12:22 PM  Additional Follow-up for Phone Call Additional follow up Details #3:: Details for Additional Follow-up Action Taken: Coumadin needs to be stopped, then check PT/INR in a couple of days and if less than two then she can start Xarelto. Additional Follow-up by: Lewayne Bunting, MD, [facility],  April 13, 2010 12:30 PM  New/Updated Medications: XARELTO 20 MG TABS (RIVAROXABAN) Take 1 tablet by mouth once a day Prescriptions: XARELTO 20 MG TABS (RIVAROXABAN) Take 1 tablet  by mouth once a day  #30 x 3   Entered by:   Hoover Brunette, LPN   Authorized by:   Lewayne Bunting, MD, [facility]   Signed by:   Hoover Brunette, LPN on 47/82/9562   Method used:   Electronically to        [facility]* (mail-order)       [address]       [place], [place]  [address]       Ph: [phone]       Fax: [phone]   RxID:   [id number]  Patient notified of above.   Hoover Brunette, LPN  April 15, 2010 3:16 PM

## 2010-04-29 ENCOUNTER — Encounter: Payer: Self-pay | Admitting: *Deleted

## 2010-04-29 NOTE — Medication Information (Signed)
Summary: RX Folder/ MEDICATION PRIOR AUTH  RX Folder/ MEDICATION PRIOR AUTH   Imported By: Dorise Hiss 04/23/2010 16:35:19  _____________________________________________________________________  External Attachment:    Type:   Image     Comment:   External Document

## 2010-04-29 NOTE — Progress Notes (Signed)
Summary: NEW MEDICATION NEED PRIOR AUTHORIZATION  Phone Note Call from Patient Call back at Home Phone [phone]   Caller: Patient Call For: nurse Summary of Call: new medication need prior authorization [phone]. Initial call taken by: Carlye Grippe,  April 18, 2010 3:52 PM  Follow-up for Phone Call        Prior auth form downloaded,filled out and faxed to [facility]. Follow-up by: Carlye Grippe,  April 22, 2010 1:25 PM

## 2010-04-29 NOTE — Medication Information (Signed)
Summary: RX Folder/  XARELTO  RX Folder/  XARELTO   Imported By: Dorise Hiss 04/23/2010 16:36:52  _____________________________________________________________________  External Attachment:    Type:   Image     Comment:   External Document

## 2010-05-02 ENCOUNTER — Encounter: Payer: Self-pay | Admitting: Cardiology

## 2010-05-02 ENCOUNTER — Ambulatory Visit: Payer: MEDICARE | Admitting: Cardiology

## 2010-05-02 ENCOUNTER — Encounter (INDEPENDENT_AMBULATORY_CARE_PROVIDER_SITE_OTHER): Payer: MEDICARE

## 2010-05-02 ENCOUNTER — Ambulatory Visit (INDEPENDENT_AMBULATORY_CARE_PROVIDER_SITE_OTHER): Payer: MEDICARE | Admitting: Cardiology

## 2010-05-02 DIAGNOSIS — I4891 Unspecified atrial fibrillation: Secondary | ICD-10-CM

## 2010-05-02 DIAGNOSIS — Z7901 Long term (current) use of anticoagulants: Secondary | ICD-10-CM

## 2010-05-02 LAB — CONVERTED CEMR LAB: POC INR: 2.5

## 2010-05-04 LAB — CBC
HCT: 35.9 % — ABNORMAL LOW (ref 36.0–46.0)
HCT: 38 % (ref 36.0–46.0)
Hemoglobin: 11.1 g/dL — ABNORMAL LOW (ref 12.0–15.0)
Hemoglobin: 12.4 g/dL (ref 12.0–15.0)
Hemoglobin: 12.9 g/dL (ref 12.0–15.0)
MCHC: 34.5 g/dL (ref 30.0–36.0)
MCHC: 34.8 g/dL (ref 30.0–36.0)
MCV: 92.6 fL (ref 78.0–100.0)
MCV: 93.7 fL (ref 78.0–100.0)
Platelets: 207 10*3/uL (ref 150–400)
Platelets: 250 10*3/uL (ref 150–400)
RBC: 3.46 MIL/uL — ABNORMAL LOW (ref 3.87–5.11)
RDW: 12.6 % (ref 11.5–15.5)
RDW: 12.9 % (ref 11.5–15.5)
RDW: 12.9 % (ref 11.5–15.5)
WBC: 14.6 10*3/uL — ABNORMAL HIGH (ref 4.0–10.5)

## 2010-05-04 LAB — DIFFERENTIAL
Basophils Absolute: 0 10*3/uL (ref 0.0–0.1)
Basophils Relative: 0 % (ref 0–1)
Eosinophils Absolute: 0.1 10*3/uL (ref 0.0–0.7)
Eosinophils Absolute: 0.1 10*3/uL (ref 0.0–0.7)
Eosinophils Relative: 1 % (ref 0–5)
Eosinophils Relative: 1 % (ref 0–5)
Lymphocytes Relative: 11 % — ABNORMAL LOW (ref 12–46)
Lymphs Abs: 1.4 10*3/uL (ref 0.7–4.0)
Monocytes Absolute: 0.8 10*3/uL (ref 0.1–1.0)
Monocytes Absolute: 1.6 10*3/uL — ABNORMAL HIGH (ref 0.1–1.0)

## 2010-05-04 LAB — HEPATITIS PANEL, ACUTE
Hep A IgM: NEGATIVE
Hep B C IgM: NEGATIVE

## 2010-05-04 LAB — BRAIN NATRIURETIC PEPTIDE: Pro B Natriuretic peptide (BNP): 422 pg/mL — ABNORMAL HIGH (ref 0.0–100.0)

## 2010-05-04 LAB — POCT I-STAT, CHEM 8
BUN: 20 mg/dL (ref 6–23)
Calcium, Ion: 1.15 mmol/L (ref 1.12–1.32)
Creatinine, Ser: 1 mg/dL (ref 0.4–1.2)
Glucose, Bld: 55 mg/dL — ABNORMAL LOW (ref 70–99)
Hemoglobin: 11.6 g/dL — ABNORMAL LOW (ref 12.0–15.0)
Sodium: 138 mEq/L (ref 135–145)
TCO2: 30 mmol/L (ref 0–100)

## 2010-05-04 LAB — POCT CARDIAC MARKERS: Myoglobin, poc: 72.6 ng/mL (ref 12–200)

## 2010-05-04 LAB — LIPID PANEL
Cholesterol: 105 mg/dL (ref 0–200)
LDL Cholesterol: 67 mg/dL (ref 0–99)
Total CHOL/HDL Ratio: 4.2 RATIO
Triglycerides: 65 mg/dL (ref <150)
Triglycerides: 67 mg/dL (ref <150)
VLDL: 13 mg/dL (ref 0–40)

## 2010-05-04 LAB — BASIC METABOLIC PANEL
CO2: 27 mEq/L (ref 19–32)
Calcium: 8.7 mg/dL (ref 8.4–10.5)
Chloride: 106 mEq/L (ref 96–112)
Creatinine, Ser: 0.8 mg/dL (ref 0.4–1.2)
Creatinine, Ser: 1.59 mg/dL — ABNORMAL HIGH (ref 0.4–1.2)
GFR calc Af Amer: 39 mL/min — ABNORMAL LOW (ref >60)
GFR calc Af Amer: >60 mL/min (ref >60)
GFR calc non Af Amer: 32 mL/min — ABNORMAL LOW (ref >60)
Glucose, Bld: 84 mg/dL (ref 70–99)
Sodium: 135 mEq/L (ref 135–145)
Sodium: 139 mEq/L (ref 135–145)

## 2010-05-04 LAB — TROPONIN I
Troponin I: 0.01 ng/mL (ref 0.00–0.06)
Troponin I: 0.02 ng/mL (ref 0.00–0.06)

## 2010-05-04 LAB — CK TOTAL AND CKMB (NOT AT ARMC)
CK, MB: 0.4 ng/mL (ref 0.3–4.0)
CK, MB: 0.7 ng/mL (ref 0.3–4.0)
Relative Index: INVALID (ref 0.0–2.5)
Relative Index: INVALID (ref 0.0–2.5)
Total CK: 34 U/L (ref 7–177)

## 2010-05-04 LAB — GLUCOSE, CAPILLARY
Glucose-Capillary: 141 mg/dL — ABNORMAL HIGH (ref 70–99)
Glucose-Capillary: 46 mg/dL — ABNORMAL LOW (ref 70–99)
Glucose-Capillary: 88 mg/dL (ref 70–99)
Glucose-Capillary: 98 mg/dL (ref 70–99)

## 2010-05-04 LAB — PROTIME-INR
INR: 1.89 — ABNORMAL HIGH (ref 0.00–1.49)
INR: 1.93 — ABNORMAL HIGH (ref 0.00–1.49)
Prothrombin Time: 21.5 seconds — ABNORMAL HIGH (ref 11.6–15.2)

## 2010-05-04 LAB — D-DIMER, QUANTITATIVE: D-Dimer, Quant: 3.97 ug/mL-FEU — ABNORMAL HIGH (ref 0.00–0.48)

## 2010-05-06 LAB — PROTIME-INR
INR: 2.37 — ABNORMAL HIGH (ref 0.00–1.49)
Prothrombin Time: 25.7 seconds — ABNORMAL HIGH (ref 11.6–15.2)

## 2010-05-06 LAB — GLUCOSE, CAPILLARY
Glucose-Capillary: 91 mg/dL (ref 70–99)
Glucose-Capillary: 109 mg/dL — ABNORMAL HIGH (ref 70–99)
Glucose-Capillary: 92 mg/dL (ref 70–99)

## 2010-05-06 LAB — SURGICAL PCR SCREEN: Staphylococcus aureus: NEGATIVE

## 2010-05-06 NOTE — Medication Information (Signed)
Summary: coumdin check per Dr.Hassen Bruun  Anticoagulant Therapy  Managed by: Vashti Hey, RN PCP: Cato Mulligan, MD Supervising MD: Andee Lineman MD, Michelle Piper Indication 1: Atrial Fibrillation (ICD-427.31) Lab [facility]: [facility] Site: [place] INR POC 2.5  Dietary changes: no    Health status changes: no    Bleeding/hemorrhagic complications: no    Recent/future hospitalizations: no    Any changes in medication regimen? yes       Details: Stopping coumadin and starting Rivaroxaban 20mg  qd   Recent/future dental: no  Any missed doses?: no       Is patient compliant with meds? yes       Allergies: 1)  Codeine 2)  Metformin Hcl  Anticoagulation Management History:      The patient is taking warfarin and comes in today for a routine follow up visit.  Positive risk factors for bleeding include an age of 67 years or older and presence of serious comorbidities.  The bleeding index is 'intermediate risk'.  Positive CHADS2 values include History of HTN and History of Diabetes.  Negative CHADS2 values include Age > 70 years old.  The start date was 12/01/2004.  Her last INR was 2.4 ratio.  Anticoagulation responsible provider: Andee Lineman MD, Michelle Piper.  INR POC: 2.5.  Cuvette Lot#: [id number].  Exp: 10/11.    Anticoagulation Management Assessment/Plan:      The patient's current anticoagulation dose is Coumadin 5 mg tabs: Take by mouth as directed by Anticoagulation Clinic.  The target INR is 2 - 3.  The next INR is due 05/02/2010.  Anticoagulation instructions were given to patient.  Results were reviewed/authorized by Vashti Hey, RN.  She was notified by Vashti Hey RN.         Prior Anticoagulation Instructions: INR 2.7 Continue coumadin 2.5mg  once daily except 5mg  on M,W,F  Current Anticoagulation Instructions: INR 2.5 Stop coumadin and start Rivaroxaban 20mg  once daily per Dr Andee Lineman

## 2010-05-06 NOTE — Letter (Signed)
Summary: Engineer, materials at [facility]  [address]   [place], [place] [address]   Phone: [phone]  Fax: [phone]        April 29, 2010 MRN: [id number]   [facility] [address] [place], [place]  [address]   Dear Ms. Coggeshall,  Your test ordered by Selena Batten has been reviewed by your physician (or physician assistant) and was found to be normal or stable. Your physician (or physician assistant) felt no changes were needed at this time.  ____ Echocardiogram  ____ Cardiac Stress Test  __X__ Lab Work - blood sugar elevated, otherwise normal   ____ Peripheral vascular study of arms, legs or neck  ____ CT scan or X-ray  ____ Lung or Breathing test  ____ Other:   Thank you.   Hoover Brunette, LPN    Duane Boston, M.D., F.A.C.C. Thressa Sheller, M.D., F.A.C.C. Oneal Grout, M.D., F.A.C.C. Cheree Ditto, M.D., F.A.C.C. Daiva Nakayama, M.D., F.A.C.C. Kenney Houseman, M.D., F.A.C.C. Jeanne Ivan, PA-C

## 2010-05-06 NOTE — Medication Information (Signed)
Summary: Coumadin Clinic  Anticoagulant Therapy  Managed by: Inactive PCP: Cato Mulligan, MD Supervising MD: Andee Lineman MD, Michelle Piper Indication 1: Atrial Fibrillation (ICD-427.31) Lab Used: [facility] Site: [place]          Comments: Coumadin stopped and pt started Rivaroxaban 20mg  once daily per Dr Andee Lineman.  Allergies: 1)  Codeine 2)  Metformin Hcl  Anticoagulation Management History:      Positive risk factors for bleeding include an age of 68 years or older and presence of serious comorbidities.  The bleeding index is 'intermediate risk'.  Positive CHADS2 values include History of HTN and History of Diabetes.  Negative CHADS2 values include Age > 46 years old.  The start date was 12/01/2004.  Her last INR was 2.4 ratio.  Anticoagulation responsible provider: Andee Lineman MD, Michelle Piper.  Exp: 10/11.    Anticoagulation Management Assessment/Plan:      The target INR is 2 - 3.  The next INR is due 05/02/2010.  Anticoagulation instructions were given to patient.  Results were reviewed/authorized by Inactive.         Prior Anticoagulation Instructions: INR 2.5 Stop coumadin and start Rivaroxaban 20mg  once daily per Dr Andee Lineman

## 2010-05-07 LAB — GLUCOSE, CAPILLARY
Glucose-Capillary: 108 mg/dL — ABNORMAL HIGH (ref 70–99)
Glucose-Capillary: 114 mg/dL — ABNORMAL HIGH (ref 70–99)
Glucose-Capillary: 121 mg/dL — ABNORMAL HIGH (ref 70–99)
Glucose-Capillary: 123 mg/dL — ABNORMAL HIGH (ref 70–99)
Glucose-Capillary: 130 mg/dL — ABNORMAL HIGH (ref 70–99)
Glucose-Capillary: 139 mg/dL — ABNORMAL HIGH (ref 70–99)
Glucose-Capillary: 140 mg/dL — ABNORMAL HIGH (ref 70–99)
Glucose-Capillary: 157 mg/dL — ABNORMAL HIGH (ref 70–99)
Glucose-Capillary: 189 mg/dL — ABNORMAL HIGH (ref 70–99)
Glucose-Capillary: 211 mg/dL — ABNORMAL HIGH (ref 70–99)

## 2010-05-07 LAB — PROTIME-INR
INR: 2.12 — ABNORMAL HIGH (ref 0.00–1.49)
INR: 2.32 — ABNORMAL HIGH (ref 0.00–1.49)
Prothrombin Time: 19.8 seconds — ABNORMAL HIGH (ref 11.6–15.2)
Prothrombin Time: 23.2 seconds — ABNORMAL HIGH (ref 11.6–15.2)
Prothrombin Time: 23.6 seconds — ABNORMAL HIGH (ref 11.6–15.2)
Prothrombin Time: 25.3 seconds — ABNORMAL HIGH (ref 11.6–15.2)

## 2010-05-07 LAB — CBC
HCT: 33 % — ABNORMAL LOW (ref 36.0–46.0)
HCT: 33.5 % — ABNORMAL LOW (ref 36.0–46.0)
Hemoglobin: 11.3 g/dL — ABNORMAL LOW (ref 12.0–15.0)
MCV: 92.7 fL (ref 78.0–100.0)
Platelets: 219 10*3/uL (ref 150–400)
Platelets: 244 10*3/uL (ref 150–400)
RDW: 12.9 % (ref 11.5–15.5)
WBC: 7.5 10*3/uL (ref 4.0–10.5)
WBC: 9 10*3/uL (ref 4.0–10.5)

## 2010-05-07 LAB — BASIC METABOLIC PANEL
BUN: 13 mg/dL (ref 6–23)
BUN: 14 mg/dL (ref 6–23)
CO2: 26 mEq/L (ref 19–32)
Calcium: 8.5 mg/dL (ref 8.4–10.5)
Chloride: 104 mEq/L (ref 96–112)
Chloride: 108 mEq/L (ref 96–112)
Creatinine, Ser: 0.74 mg/dL (ref 0.4–1.2)
Creatinine, Ser: 0.81 mg/dL (ref 0.4–1.2)
GFR calc Af Amer: >60 mL/min (ref >60)
GFR calc non Af Amer: >60 mL/min (ref >60)
GFR calc non Af Amer: >60 mL/min (ref >60)
GFR calc non Af Amer: >60 mL/min (ref >60)
Glucose, Bld: 143 mg/dL — ABNORMAL HIGH (ref 70–99)
Glucose, Bld: 145 mg/dL — ABNORMAL HIGH (ref 70–99)
Potassium: 4.9 mEq/L (ref 3.5–5.1)
Sodium: 138 mEq/L (ref 135–145)
Sodium: 140 mEq/L (ref 135–145)

## 2010-05-08 ENCOUNTER — Telehealth: Payer: Self-pay | Admitting: *Deleted

## 2010-05-08 NOTE — Telephone Encounter (Signed)
States she takes Equate nightly 50mg  for motion sickness and Ibuprofen 200mg  - 2 tabs nightly for left elbow pain as needed.  Wants to know if okay to take with her Xarelto?

## 2010-05-14 ENCOUNTER — Encounter: Payer: MEDICARE | Admitting: Vascular Surgery

## 2010-05-15 NOTE — Assessment & Plan Note (Signed)
Summary: 1 month f/u --agh   Visit Type:  Follow-up Primary Provider:  Cato Mulligan, MD   History of Present Illness: The patient had recent blood work done.  Creatinine was within normal limits potassium was within normal limits.  The patient is currently taking Xarelto. The patient had lower extremity Dopplers done couple of weeks ago and this was within normal limits.  These were venous Dopplers.  Her last echocardiogram was from November 2011 and was essentially within normal limits. The patient is a 65 year old female with history fibrillation, status-post PVI @ [address]. This was complicated by a pericardial effusion. The patient also has coronary artery disease and is status post bare-metal stent to the circumflex in 2008.  He is also status post pacemaker implantation.  Indications tachybradycardia syndrome.  She has atrial  fibrillation but this has been controlled on dofetilide. Recently she has complained of some lower extremity edema.  However venous Dopplers were within normal limits and the patient also has normal LV systolic function. The patient's lower extremity edema has not been controlled with compression stockings. From a cardiac standpoint she is doing well.  She has no recurrent palpitations.  She reports no chest pain or shortness of breath.   Preventive Screening-Counseling & Management  Alcohol-Tobacco     Smoking Status: never  Current Medications (verified): 1)  Aspirin 81 Mg Tbec (Aspirin) .... Take One Tablet By Mouth Daily 2)  Glipizide 10 Mg Tabs (Glipizide) .Marland Kitchen.. 1 Tablet in The Am 1/2 Tablet in Thepm 3)  Diltiazem Hcl Er Beads 360 Mg Xr24h-Cap (Diltiazem Hcl Er Beads) .... Take One Capsule By Mouth Daily 4)  Potassium Chloride Crys Cr 20 Meq Cr-Tabs (Potassium Chloride Crys Cr) .... Take One Tablet By Mouth Twice Daily 5)  Atenolol 50 Mg Tabs (Atenolol) .... Take 1 Tablet By Mouth Every Morning 6)  Pravastatin Sodium 40 Mg Tabs (Pravastatin Sodium) ....  Take 1 Tablet By Mouth Every Night 7)  Tikosyn 500 Mcg Caps (Dofetilide) .... Take 1 Tablet By Mouth Two Times A Day 8)  Bumetanide 2 Mg Tabs (Bumetanide) .... Take 2 Tablets By Mouth Every Morning 9)  Onglyza 5 Mg Tabs (Saxagliptin Hcl) .... Once Daily 10)  Nitrostat 0.4 Mg Subl (Nitroglycerin) .Marland Kitchen.. 1 Tablet Under Tongue At Onset of Chest Pain; You May Repeat Every 5 Minutes For Up To 3 Doses. 11)  Bilateral Knee High Compression Stockings - Low Pressure .... Use As Directed 12)  Xarelto 20 Mg Tabs (Rivaroxaban) .... Take 1 Tablet By Mouth Once A Day  Allergies (verified): 1)  Codeine 2)  Metformin Hcl  Comments:  Nurse/Medical Assistant: The patient's medication list and allergies were reviewed with the patient and were updated in the Medication and Allergy Lists.  Past History:  Past Medical History: Last updated: 07/17/2009  1. Atrial fibrillation  2. Atrial flutter previously documented on EKG without flutter ablation previously.   3. Coronary artery disease status post bare-metal stent of the left circumflex artery in September 2008.   4. Preserved ejection fraction.   5. Type 2 diabetes mellitus.   6. Hyperlipidemia.   7. Thyroid nodules per pt  8. Recurrent syncope  9. s/p PPM for tachybrady syndrome and syncope  Past Surgical History: Last updated: 07/17/2009 Ventral hernia repair Rotator Cuff Hysterectomy Lumpectomy S/p PPM for syncope/ tachycardia bradycardia syndrome  Family History: Last updated: 03/19/2008 Unremarkable  Social History: Last updated: 11/07/2008 Pt lives in [place] with spouse.   Retired from [employer]. Tobacco Use -  No.  Alcohol Use - no  Risk Factors: Smoking Status: never (05/02/2010)  Review of Systems  The patient denies fatigue, malaise, fever, weight gain/loss, vision loss, decreased hearing, hoarseness, chest pain, palpitations, shortness of breath, prolonged cough, wheezing, sleep apnea, coughing up blood, abdominal pain,  blood in stool, nausea, vomiting, diarrhea, heartburn, incontinence, blood in urine, muscle weakness, joint pain, leg swelling, rash, skin lesions, headache, fainting, dizziness, depression, anxiety, enlarged lymph nodes, easy bruising or bleeding, and environmental allergies.    Vital Signs:  Patient profile:   65 year old female Height:      63 inches Weight:      192 pounds BMI:     34.13 Pulse rate:   69 / minute BP sitting:   113 / 68  (left arm) Cuff size:   large  Vitals Entered By: Carlye Grippe (May 02, 2010 12:56 PM)  Nutrition Counseling: Patient's BMI is greater than 25 and therefore counseled on weight management options.  Physical Exam  Additional Exam:  General: Well-developed, well-nourished in no distress head: Normocephalic and atraumatic eyes PERRLA/EOMI intact, conjunctiva and lids normal nose: No deformity or lesions mouth normal dentition, normal posterior pharynx neck: Supple, no JVD.  No masses, thyromegaly or abnormal cervical nodes lungs: Normal breath sounds bilaterally without wheezing.  Normal percussion heart: regular rate and rhythm with normal S1 and S2, no S3 or S4.  PMI is normal.  No pathological murmurs abdomen: Normal bowel sounds, abdomen is soft and nontender without masses, organomegaly or hernias noted.  No hepatosplenomegaly musculoskeletal: Back normal, normal gait muscle strength and tone normal pulsus: Pulse is normal in all 4 extremities Extremities: No peripheral pitting edema neurologic: Alert and oriented x 3 skin: Intact without lesions or rashes cervical nodes: No significant adenopathy psychologic: Normal affect    PPM Specifications Following MD:  Hillis Range, MD     PPM Vendor:  [employer]     PPM Model Number:  [id number]     PPM Serial Number:  [id number] PPM DOI:  06/25/2009     PPM Implanting MD:  Hillis Range, MD  Lead 1    Location: RA     DOI: 06/25/2009     Model #: [id number]     Serial #: [id number]     Status:  active Lead 2    Location: RV     DOI: 06/25/2009     Model #: [id number]     Serial #: [id number]     Status: active  Magnet Response Rate:  BOL 85 ERI 65  Indications:  Tachy-brady syndrome   PPM Follow Up Pacer Dependent:  No      Episodes Coumadin:  Yes  Parameters Mode:  DDDR+     Lower Rate Limit:  60     Upper Rate Limit:  130 Paced AV Delay:  250     Sensed AV Delay:  220  ILR Following MD Hillis Range, MD    Impression & Recommendations:  Problem # 1:  ATRIAL FIBRILLATION (ICD-427.31) atrial fibrillation on dofetilide pulmonary vein isolation maintaining normal sinus rhythm on dofetilide.  The following medications were removed from the medication list:    Coumadin 5 Mg Tabs (Warfarin sodium) .Marland Kitchen... Take by mouth as directed by anticoagulation clinic Her updated medication list for this problem includes:    Aspirin 81 Mg Tbec (Aspirin) .Marland Kitchen... Take one tablet by mouth daily    Atenolol 50 Mg Tabs (Atenolol) .Marland Kitchen... Take 1 tablet by mouth every morning  Tikosyn 500 Mcg Caps (Dofetilide) .Marland Kitchen... Take 1 tablet by mouth two times a day  Problem # 2:  BRADYCARDIA-TACHYCARDIA SYNDROME (ICD-427.81) status post permanent pacemaker implantation: Follow-up with Dr. Johney Frame The following medications were removed from the medication list:    Coumadin 5 Mg Tabs (Warfarin sodium) .Marland Kitchen... Take by mouth as directed by anticoagulation clinic Her updated medication list for this problem includes:    Aspirin 81 Mg Tbec (Aspirin) .Marland Kitchen... Take one tablet by mouth daily    Diltiazem Hcl Er Beads 360 Mg Xr24h-cap (Diltiazem hcl er beads) .Marland Kitchen... Take one capsule by mouth daily    Atenolol 50 Mg Tabs (Atenolol) .Marland Kitchen... Take 1 tablet by mouth every morning    Tikosyn 500 Mcg Caps (Dofetilide) .Marland Kitchen... Take 1 tablet by mouth two times a day    Nitrostat 0.4 Mg Subl (Nitroglycerin) .Marland Kitchen... 1 tablet under tongue at onset of chest pain; you may repeat every 5 minutes for up to 3 doses.  Problem # 3:  LONG-TERM  (CURRENT) USE OF ANTICOAGULANTS (ICD-V58.61) anticoagulation with Xarelto : Will check PT/INR today if INR less than 2.5 the patient will start Xarelto  Problem # 4:  PERIPHERAL EDEMA (ICD-782.3) lower extremity edema: Resolved right lower extremity compression stockings  Patient Instructions: 1)  Stop Coumadin 2)  May begin Xarelto tomorrow 3)  Continue Aspirin 81mg  daily 4)  Follow up in  6 months

## 2010-05-21 ENCOUNTER — Encounter: Payer: MEDICARE | Admitting: Vascular Surgery

## 2010-05-27 NOTE — Telephone Encounter (Signed)
Xarelto OK to take with Equate , caution advised for combination with Ibuprofen, but if she takes at current dose should be oK, but risl of bleeding does increase. Acetaminophen would be preferable.

## 2010-06-02 NOTE — Telephone Encounter (Signed)
Patient notified verbalized understanding

## 2010-06-05 ENCOUNTER — Other Ambulatory Visit: Payer: Self-pay | Admitting: *Deleted

## 2010-06-05 MED ORDER — PRAVASTATIN SODIUM 40 MG PO TABS: 40.0000 mg | ORAL_TABLET | Freq: Every day | ORAL | Status: DC

## 2010-06-05 MED ORDER — DOFETILIDE 500 MCG PO CAPS: 500.0000 ug | ORAL_CAPSULE | Freq: Two times a day (BID) | ORAL | Status: DC

## 2010-06-05 MED ORDER — POTASSIUM CHLORIDE CRYS ER 20 MEQ PO TBCR: 20.0000 meq | EXTENDED_RELEASE_TABLET | Freq: Two times a day (BID) | ORAL | Status: DC

## 2010-06-18 ENCOUNTER — Encounter (INDEPENDENT_AMBULATORY_CARE_PROVIDER_SITE_OTHER): Payer: MEDICARE

## 2010-06-18 ENCOUNTER — Encounter (INDEPENDENT_AMBULATORY_CARE_PROVIDER_SITE_OTHER): Payer: MEDICARE | Admitting: Vascular Surgery

## 2010-06-18 DIAGNOSIS — M7989 Other specified soft tissue disorders: Secondary | ICD-10-CM

## 2010-06-18 DIAGNOSIS — I83893 Varicose veins of bilateral lower extremities with other complications: Secondary | ICD-10-CM

## 2010-06-19 NOTE — Consult Note (Signed)
NEW PATIENT CONSULTATION  [name] [name] DOB:  December 29, 1945                                       06/18/2010 JXBJY#:[id number]  Patient presents today for evaluation of lower extremity swelling and marked venous varicosities.  This is worse on the left leg than on the right leg.  She has a long history of cardiac difficulty and a long history of lower extremity swelling.  Her swelling has been treated with elevation and knee-high compression.  She does have a history of this with atrial fibrillation, atrial flutter with ablation and with a pacemaker.  She does have a history of cardiac stenting, type 2 diabetes mellitus, hyperlipidemia..  She reports pain, more so on the left leg than on the right over these large varicosities with prolonged standing. She has a history of DVT and no history of any bleeding from the varices.  She reports that the swelling is relieved overnight and that she has no swelling in the morning, and it is more progressive over the course of the day.  SOCIAL HISTORY:  She is married, retired.  Does not drink alcohol and does not smoke.  FAMILY HISTORY:  Positive for congestive heart failure in father, brother, and sister.  REVIEW OF SYSTEMS:  Weight gain of 196 pounds.  She is 5 feet 3 inches tall.  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 CARDIAC:  Palpitations before pacemaker insertion. NEUROLOGIC:  Blackouts and syncope before her pacer as well. GU:  Urinary frequency. ENT:  Change in eyesight. MUSCULOSKELETAL:  Positive for joint pain, otherwise review of systems negative.  PHYSICAL EXAMINATION:  A well-developed and well-nourished white female appearing her stated age in no acute stress.  Blood pressure is 132/63, pulse 79, respirations 22.  HEENT:  Normal.  Her radial pulses are 2+. She has 2+ posterior tibial pulses bilaterally.  Abdomen:  Moderately obese.  No tenderness.  Musculoskeletal  shows no major deformities or cyanosis.  She does have hemosiderin deposits circumferentially in both legs from her mid calf distally.  Neurologic:  No focal weakness or paresthesias.  Skin:  No other areas of ulcers or rashes.  She did undergo noninvasive vascular laboratory studies in our office, and this revealed reflux throughout her great saphenous vein bilaterally.  On imaging this with SonoSite, she does have reflux into these large branches off her left great saphenous vein extending into these varicosities.  She does have some reflux in the deep system bilaterally.  I discussed this at length with patient.  She is having pain over the varices that are extensions of the great saphenous vein, most particularly on the left.  I explained the significance of her swelling due to her venous hypertension and skin changes as well.  I explained that with some deep venous incompetence, this could not be totally corrected, but I feel that we would make a significant impact by ablation of her surface veins.  She understands that we would recommend a trial of thigh-high compression garments prior to any consideration of treatment.  We have to see her again in 3 months for continued discussion.    Larina Earthly, M.D. Electronically Signed  TFE/MEDQ  D:  06/18/2010  T:  06/19/2010  Job:  [id number]  cc:   [name], MD,FACC

## 2010-06-26 NOTE — Procedures (Unsigned)
LOWER EXTREMITY VENOUS REFLUX EXAM  INDICATION:  Edema, bilateral varicose veins.  EXAM:  Using color-flow imaging and pulse Doppler spectral analysis, the bilateral common femoral, superficial femoral, popliteal, posterior tibial, greater and lesser saphenous veins are evaluated.  There is evidence suggesting deep venous insufficiency in the bilateral lower extremities.  The bilateral saphenofemoral junction is competent. The bilateral GSV's are not competent with Reflux of >552milliseconds with the caliber as described below.  The bilateral proximal small saphenous veins demonstrate competency; however, there is a varicose branch that communicates with the great saphenous vein.  GSV Diameter (used if found to be incompetent only)                                           Right    Left Proximal Greater Saphenous Vein           0.39 cm  0.65 cm Proximal-to-mid-thigh                     0.43 cm  0.63 cm Mid thigh                                 cm       0.33 cm Mid-distal thigh                          cm       cm Distal thigh                              0.31 cm  0.44 cm Knee                                      0.21 cm  0.36 cm   IMPRESSION: 1. Bilateral greater saphenous veins are not competent with reflux     >570milliseconds. 2. The bilateral great saphenous veins are not tortuous. 3. The deep venous system is not competent with Reflux     . 4. The bilateral small saphenous veins are competent.         ___________________________________________ Larina Earthly, M.D.  LT/MEDQ  D:  06/18/2010  T:  06/18/2010  Job:  [id number]

## 2010-07-01 ENCOUNTER — Other Ambulatory Visit: Payer: Self-pay | Admitting: *Deleted

## 2010-07-01 ENCOUNTER — Encounter: Payer: Self-pay | Admitting: Internal Medicine

## 2010-07-01 MED ORDER — RIVAROXABAN 20 MG PO TABS: 1.0000 | ORAL_TABLET | Freq: Every day | ORAL | Status: DC

## 2010-07-01 NOTE — Assessment & Plan Note (Signed)
[facility]                          [facility] NOTE   MACKINLEY, CASSADAY                        MRN:          [id number]  DATE:08/26/2006                            DOB:          1945/06/05    CARDIOLOGY OFFICE NOTE   CARDIOLOGIST:  Dr. Lewayne Bunting.   PRIMARY CARE Kirsta Probert:  Dierdre Forth, PA-C at the health department.   HISTORY OF PRESENT ILLNESS:  [name] [name] is a 65 year old female patient  with a history of paroxysmal atrial fibrillation maintained on Tikosyn  therapy, as well as chronic Coumadin, who presents to the office today  for followup.  She last saw Gene Serpe, PA-C in the office in March.  At  that time, she was taken off of Lanoxin.  She tells me today that she  has actually had less breakthrough episodes of tachy palpitations since  stopping the Lanoxin.  She was having symptoms probably every day before  that.  Since that time, she has had symptoms probably once to twice a  week on average.  They usually last about 15 to 30 minutes.  They  sometimes awaken her.  She sometimes has some chest pain associated with  this.  These symptoms are typical for what she has had ever since she  started having atrial fibrillation a couple of years ago.  There have  been no changes.  She has been worked up with a Cardiolite in the past,  which was negative for ischemia.  She denies any associated nausea,  shortness of breath, syncope, or near-syncope.  Aside from the  palpitations, she has no other complaints.  Her INR today is low at 1.1.  She has not missed any of her medications, has not changed her diet.  She is quite upset about this and is worried about thromboembolism.  She  also showed me a diary of all of her symptoms that she has had dating  back to 2006.   MEDICATIONS:  1. Aspirin 81 mg daily.  2. Glucophage 500 mg 2 tablets daily.  3. Dietrol LA 4 mg daily.  4. Coumadin as directed.  5. Atacand 16 mg daily.  6.  Tikosyn 500 mg b.i.d.  7. Glucotrol XL 5 mg daily.  8. Atenolol 50 mg daily.  9. Lisinopril 20 mg twice daily.  10.Hydrochlorothiazide 25 mg daily.  11.Hydralazine 10 mg 4 tablets 4 times a day.  12.Atenolol 50 mg 1/2 tablet p.r.n.   ALLERGIES:  CODEINE.   PHYSICAL EXAM:  She is a well-nourished, well-developed female in no  acute distress.  Blood pressure 148/78, pulse 55, weight 175 pounds.  HEENT:  Normal  NECK:  Without JVD at 90 degrees.  CARDIAC:  Normal S1, S2.  Regular rate and rhythm without murmurs.  LUNGS:  Clear to auscultation bilaterally without wheeze, rales, or  rhonchi.  ABDOMEN:  Soft and nontender with normoactive bowel sounds.  No  organomegaly.  EXTREMITIES:  Without edema.   IMPRESSION:  1. Paroxysmal atrial fibrillation.      a.     Tikosyn therapy.  b.     Coumadin therapy.      c.     Breakthrough tachy palpitations while on Tikosyn therapy.  2. Hypertension, uncontrolled.  3. Diabetes mellitus type 2.  4. Nonischemic Cardiolite in 2005.  5. Good left ventricular function with an ejection fraction of 60-65%      by echocardiogram in 2006.  6. Probable anxiety disorder.   PLAN:  Ms. Quiggle presents to the office today for followup.  As noted  above, she has still frequent episodes of breakthrough tachy  palpitations even though she is on Tikosyn and beta blocker therapy.  She says that her tachy palpitations actually decreased in number when  the Lanoxin was discontinued.  However, she continues to have  significant symptoms with this and she is quite bothered by it.  I  discussed this further with Dr. Andee Lineman and, at this point in time, we  recommend referring her back to Dr. Graciela Husbands for further recommendations.  She has been considered for pulmonary vein isolation therapy in the  past.  She was going to be sent to Dr. Janey Genta at [facility].  However, the  patient was apparently turned down secondary to no insurance.  It has  been suggested in the past  that she might be a candidate for AV nodal  ablation with pacemaker implantation.  This can be discussed further  with Dr. Graciela Husbands when she sees him.   She does need better control of her blood pressure.  I have recommended  going up on her Atacand to 32 mg a day.  We will check a BMET in a week.   She seems to suffer from significant anxiety.  I tried to reassure her  about her INR level today and I tried to explain to her overall  treatment with anticoagulation to prevent thromboembolism.  She is still  quite concerned.  We will increase her Coumadin to 10 mg today and then  5 mg every day with close followup with an INR early next week.  I have  also provided her with a note to take back to Ms. Allen at the health  department for possible treatment of her anxiety.  She may benefit from  an anxiolytic or an SSRI.  There are some SSRIs that would be  contraindicated in conjunction with Tikosyn as it would cause QT  prolongation.  I have cautioned Ms. Allen in my note that she should  cross-reference these medications before starting, or she can feel free  to call either Dr. Andee Lineman or myself to make sure that the medication  would be safe.   She will be set up for followup with Dr. Andee Lineman in the next 3 months.   ADDENDUM:  Ms. Maslanka noted that she was taking 10 mg of hydralazine four  tablets 4 times a day and felt that the amount of pills was quite  cumbersome.  Therefore, I have decided to hold off on increasing her  Atacand.  She will stay on 16 mg a day.  She does not require a B-MET in  1 weeks given this.  She has been changed to hydralazine 25 mg two  tablets 4 times a day for better blood pressure control and she has been  provided with a prescription for this new dosage.      Tereso Newcomer, PA-C  Electronically Signed      [name], MD,FACC  Electronically Signed   SW/MedQ  DD: 08/26/2006  DT: 08/26/2006  Job #:  [id number]   cc:   Dierdre Forth, PA-C  Duke Salvia, MD, [facility]

## 2010-07-01 NOTE — Assessment & Plan Note (Signed)
[facility]                          [facility] NOTE   LABRIA, WOS                        MRN:          [id number]  DATE:08/28/2008                            DOB:          [birth year]    HISTORY OF PRESENT ILLNESS:  The patient is a 65 year old female with a  history of atrial fibrillation status post pulmonary vein isolation  (partially aborted secondary to tamponade) and prior history of atrial  flutter without ablation.  The patient also has known coronary artery  disease status post stent to the circumflex in 2008.  The patient has  been doing well since March.  She has had no recurrent paroxysms of  palpitations.  She denies any chest pain, shortness of breath,  orthopnea, or PND.  At this point, she has decided against proceeding  with her repeat atrial fibrillation and pulmonary vein isolation  procedure.  Clinically, she appears to be doing well.   MEDICATIONS:  1. Aspirin 81 mg p.o. daily.  2. Glucotrol XL 5 mg p.o. daily.  3. Detrol LA 4 mg p.o. daily..  4. Lisinopril 5 mg p.o. daily..  5. Atenolol 25 mg p.o. b.i.d.  6. Diltiazem 20 mg p.o. b.i.d.  7. Glucophage 500 mg p.o. b.i.d.  8. Pravastatin 40 mg p.o. at bedtime.  9. Coumadin 5 mm as directed.  10.Bumetanide 2 mg p.o. b.i.d.  11.Potassium 20 mEq p.o. b.i.d.  12.Tikosyn 250 mcg p.o. b.i.d.   PHYSICAL EXAMINATION:  VITAL SIGNS:  Blood pressure 132/67, heart rate  54, weight 127 pounds.  GENERAL:  Well-nourished white female in no apparent distress.  HEENT:  Pupils isochoric.  Conjunctivae clear.  NECK:  Supple.  Normal carotid stroke.  No carotid bruits.  LUNGS:  Clear breath sounds bilaterally.  HEART:  Regular rate and rhythm.  Normal S1 and S2.  No murmur, rubs, or  gallops.  ABDOMEN:  Soft, nontender.  No rebound or guarding.  Good bowel sounds.  EXTREMITIES:  No cyanosis, clubbing, or edema.  NEUROLOGIC:  The patient is alert, oriented, and grossly  nonfocal.   IMPRESSION:  1. Atrial fibrillation status post partially-aborted pulmonary vein      isolation at [facility] secondary to tamponade.  2. History of documented flutter, currently normal sinus rhythm on      Tikosyn (no flutter ablation procedure previously).  3. Coronary artery disease status post bare-metal stent on the      circumflex in September 2008.  4. Preserved left ventricular function.  5. Type 2 diabetes mellitus.  6. Hyperlipidemia.   PLAN:  1. The patient is well from a clinical standpoint.  The patient has      known paroxysms of atrial fibrillation.  We will continue her on      her current medical regimen, although the patient wanted to down      titrate some medications.  I decided against this.  2. We can also hold off on consideration of repeating the catheter      ablation for now as the patient is responding well to her  medications.  3. The patient will have a routine Lexiscan done in 6 months given her      prior history of coronary artery disease and prior stent placement      of the circumflex in 2008.     [name], MD,FACC  Electronically Signed    GED/MedQ  DD: 08/28/2008  DT: 08/29/2008  Job #: [id number]   cc:   Dierdre Highman. Loney Hering, MD

## 2010-07-01 NOTE — Procedures (Signed)
NAMEMCKAYLIN, BASTIEN                 ACCOUNT NO.:  [id number]   MEDICAL RECORD NO.:  [id number]          PATIENT TYPE:  OUT   LOCATION:  SLEEP LAB                     FACILITY:  [facility]   PHYSICIAN:  Barbaraann Share, MD,FCCPDATE OF BIRTH:  03/11/43   DATE OF STUDY:  11/14/2006                            NOCTURNAL POLYSOMNOGRAM   REFERRING PHYSICIAN:  Duke Salvia, MD, [facility]   INDICATION FOR STUDY:  Hypersomnia with sleep apnea.   EPWORTH SLEEPINESS SCORE:  3.   SLEEP ARCHITECTURE:  The patient had a total sleep time of 290 minutes  with adequate slow-wave sleep for age but decreased REM.  Sleep onset  latency was normal, and REM onset was prolonged.  Sleep efficiency was  decreased at 75%.   RESPIRATORY DATA:  The patient was found to have 1 obstructive hypopnea  and no apneas for Apnea/hypopnea index less than 1.  Split-night  protocol was not instituted secondary to lack of obstructive events.  The patient did have moderate snoring noted throughout.   OXYGEN DATA:  The patient had spontaneous O2 desaturation as low as 86%  during REM.  Of note, she spent only 17 minutes, less than 90%.   CARDIAC DATA:  No clinically significant cardiac arrhythmias were noted.   MOVEMENT-PARASOMNIA:  None.   IMPRESSIONS-RECOMMENDATIONS:  1. Small numbers of obstructive events which do not meet the      Apnea/hypopnea index criteria for the obstructive sleep apnea      syndrome.  2. Spontaneous oxygen desaturation during REM, as low as 86%.  The      patient spent only 17 minutes the entire night, less than 90%.      Therefore, supplemental nocturnal oxygen is not clinically      necessary.     Barbaraann Share, MD,FCCP  Diplomate, American Board of Sleep  Medicine  Electronically Signed    KMC/MEDQ  D:  11/27/2006 14:16:51  T:  11/28/2006 15:37:01  Job:  [id number]

## 2010-07-01 NOTE — Assessment & Plan Note (Signed)
[facility]                          [facility]   [name] [name], [name] [name]                        MRN:          [id number]  DATE:11/26/2006                            DOB:          08/24/1945    HISTORY OF PRESENT ILLNESS:  The patient is a 65 year old female with a  history of atrial fibrillation dating back several years.  The patient's  episodes of atrial fibrillation occur both during the daytime and at  night.  She experiences tachy palpitations that are associated with  chest tightness and headache.  There are also associated symptoms of  lightheadedness and diaphoresis on initial presentation.  The patient  has been on Tikosyn for several years.  She was previously on Digitek  which was discontinued and this has been associated with decreased  frequency of her tachy palpitations.  The patient was recently seen by  Dr. Graciela Husbands in the office on October 20, 2006.  She did report associated  chest pain with tachy palpitations and he referred her for  catheterization.  The patient was found to have essential single-vessel  coronary artery disease with a high grade stenosis in the distal  circumflex which was stented with a 2-0 Minivision bare metal stent.  The patient was placed one month on Plavix therapy.  The patient states  that since her percutaneous coronary intervention her episodes of chest  pain have become less frequent but are not completely abolished.  She  continues to have frequent tachy palpitations which last typically from  15-45 minutes.  She takes also atenolol p.r.n. for these episodes.  Some  days she takes up to 3 atenolol of 25 mg.  Dr. Graciela Husbands also suspected that  Diltiazem might help the patient secondary to her sympathovagal  imbalance.  However, she states that she has not noticed any difference  in the frequency of palpitations after she started the calcium channel  blocker.   MEDICATIONS:  1. Diltiazem 60 mg  p.o. q.8 h.  2. Hydralazine 25 mg, two tablets 4 times a day.  3. Pravastatin 40 mg p.o. q.h.s.  4. Aspirin 81 mg p.o. daily.  5. Glucophage 5 mg, two tablets daily.  6. Atacand 16 mg p.o. daily.  7. Glucotrol XL 5 mg p.o. daily.  8. Lisinopril 20 mg p.o. b.i.d.  9. Tikosyn 500 mcg p.o. b.i.d.  10.Hydrochlorothiazide 25 mg p.o. daily.  11.Coumadin 5 mg as directed.  12.Detrol LA 4 mg p.o. daily.  13.Atenolol 25 mg p.o. p.r.n.   PHYSICAL EXAMINATION:  VITAL SIGNS:  Blood pressure is 128/71, heart  rate is 71 beats per minute, weight is 175 pounds.  NECK:  Normal carotid upstroke and no carotid bruits.  LUNGS:  Clear breath sounds bilaterally.  HEART:  Regular rate and rhythm with normal S1 S2.  No murmurs, rubs, or  gallops.  ABDOMEN:  Soft, nontender.  No rebound or guarding.  Good bowel sounds.  EXTREMITIES:  No cyanosis, clubbing, or edema.  NEUROLOGIC:  The patient is alert and oriented, grossly nonfocal.   PROBLEM LIST:  1. Single-vessel coronary artery disease,  status post Minivision stent      to the  distal circumflex.  2. Paroxysmal atrial fibrillation.      a.     Tikosyn therapy.      b.     Prolonged QT interval by EKG.      c.     Recurrent episodes of palpitations.  3. Rule out sleep apnea, sleep study pending.  4. Chronic Coumadin therapy.  5. Hypertension.  6. Type 2 diabetes mellitus.   PLAN:  1. As per Dr. Odessa Fleming note and due to the findings of prolonged QT on      the EKG, we will decrease the patient's Tikosyn to 250 mcg p.o.      b.i.d.  2. The patient will need to take 20 mEq of K-Dur daily to avoid      hypokalemia.  3. Sleep study results are currently pending.  4. I have contacted Neill Loft at [facility] and she was so kind to accept      the patient for an elective evaluation with the EP service for      possible pulmonary vein isolation.  We will refer the patient as      soon as an appointment becomes available.     [name], MD,FACC   Electronically Signed    GED/MedQ  DD: 11/26/2006  DT: 11/26/2006  Job #: [id number]   cc:   Prentice Docker, MD

## 2010-07-01 NOTE — Assessment & Plan Note (Signed)
[facility]                         ELECTROPHYSIOLOGY OFFICE NOTE   ARDINE, IACOVELLI                        MRN:          [id number]  DATE:01/25/2008                            DOB:          September 04, 1945    Ms. [name] [name] returns today for followup.  She is a very pleasant middle-  aged woman with hypertension, diabetes, and coronary disease, who has a  history of atrial fibrillation and underwent attempted ablation at [facility] for her AFib just over a year ago.  The procedure was  complicated by pericardial effusion and pericardial tamponade, requiring  percutaneous drainage.  Since her procedure, she has continued to have  episodes of atrial fibrillation.  She was placed on Tikosyn.  The  patient unfortunately continues to have breakthrough episodes of AFib  lasting several hours at a time.  These spells occur several times a  week, though they are probably not every day.  She is bothered by these.  She feels short of breath and chest discomfort when she has them.  The  patient returns today for followup.   Her other medications include:  1. Warfarin as directed.  2. Glucotrol XL 5 a day.  3. Glucophage 500 twice a day.  4. Detrol LA 4 a day.  5. Aspirin 81 a day.  6. Atenolol 25 a day.  7. Lisinopril 5 a day.  8. Pravachol 40 a day.  9. Diltiazem 120 three times a day.  10.Tikosyn 250 twice a day.  11.P.r.n. atenolol as well.   On exam, she is a pleasant middle-aged woman in no distress.  Blood  pressure was 114/80, the pulse 64 and regular, the respirations were 18,  and the weight was 184 pounds.  Neck revealed no jugular venous  distention.  Lungs clear bilaterally to auscultation.  No wheezes,  rales, or rhonchi are present.  There is no increased work of breathing.  The cardiovascular exam revealed a regular rate and rhythm.  Normal S1  and S2.  No murmurs, rubs, or gallops present.  PMI was not enlarged nor  laterally displaced.   The abdominal exam was soft and nontender.  Extremities demonstrated no cyanosis, clubbing, or edema.  The pulses  were 2+ and symmetric.   IMPRESSION:  1. Paroxysmal atrial fibrillation.  2. History of catheter ablation of atrial fibrillation complicated by      pericardial tamponade, requiring pericardiocentesis with residual      pericarditis, now resolved.  3. Hypertension.  4. Diabetes.  5. Coronary disease status post stenting.   DISCUSSION:  [name] despite Tikosyn therapy for her AFib remains  symptomatic with her AFib.  I have outlined several options for the  patient.  One would be to do nothing except continue her medicines.  Second option will be to switch to amiodarone which I think is not a  good choice based on her age and size.  Third option would be to  consider redo catheter ablation of her AFib.  I have referred to Dr.  Hillis Range for consideration of a redo ablation procedure, and she  is  considering this and will schedule an appointment as she prefers to.     [name]. Ladona Ridgel, MD  Electronically Signed    GWT/MedQ  DD: 01/25/2008  DT: 01/25/2008  Job #: [id number]   cc:   [name], MD,FACC

## 2010-07-01 NOTE — Assessment & Plan Note (Signed)
[facility]                          [facility] [facility]   [name] [name], [name] [name]                        MRN:          [id number]  DATE:08/26/2006                            DOB:          Nov 28, 1945    Please attach this addendum to the original note and send it to Vertis Kelch, PA-C at the Health Department.  Also attach to original note and  send entire note to the attention of Dr. Duke Salvia, MD, [facility].   ADDENDUM:  Ms. Arnall noted that she was taking 10 mg of hydralazine four  tablets 4 times a day and felt that the amount of pills was quite  cumbersome.  Therefore, I have decided to hold off on increasing her  Atacand.  She will stay on 16 mg a day.  She does not require a B-MET in  1 weeks given this.  She has been changed to hydralazine 25 mg two  tablets 4 times a day for better blood pressure control and she has been  provided with a prescription for this new dosage.      Tereso Newcomer, PA-C  Electronically Signed      [name], MD,FACC  Electronically Signed   SW/MedQ  DD: 08/26/2006  DT: 08/26/2006  Job #: [id number]   cc:   Vertis Kelch, PA-C  Duke Salvia, MD, [facility]

## 2010-07-01 NOTE — Assessment & Plan Note (Signed)
[facility]                          [facility]   [name] [name], [name] [name]                        MRN:          [id number]  DATE:02/16/2007                            DOB:          06-24-1945    REFERRING PHYSICIAN:  Vertis Kelch, Health Department   HISTORY OF PRESENT ILLNESS:  The patient is a 65 year old female with  history of paroxysmal atrial fibrillation which was quite symptomatic.  The patient was referred to [facility] and underwent pulmonary vein  isolation by Dr. Josue Hector.  The procedure was complicated by pericardial  tamponade requiring drain placement.  According to the records, and in  talking with Dr. Josue Hector, it appeared that the procedure was incomplete  and approximately 80% of ablation lines were completed.  The patient did  not have a right sided flutter line placed.  The patient was seen in the  office on January 31, 2007, when she presented with recurrent  palpitations and was found to be in atrial flutter.  Beta blocker was  added to her medical regimen and I discussed this with Dr. Josue Hector.  She  was continued on Tikosyn.  Fortunately, in the interim, the patient has  revered back to normal sinus rhythm.  No heart cardioversion was  required.  She also underwent several studies, in particular, an echo to  follow up on the possibility of pericardial effusion and a Holter  monitor.  There was no evidence of recurrent pericardial effusion, and  the Holter monitor showed very rare episodes of PACs and brief runs of  paroxysmal atrial fibrillation.  Overall, the patient's atrial  fibrillation burden has markedly decreased, and clinically, also she  states that she has dramatically improved.  She seems to be tolerating  her Atenolol quite well.  Today in the office, she remains in normal  sinus rhythm.   MEDICATIONS:  1. Pravastatin 40 mg p.o. q.h.s.  2. Aspirin 81 mg p.o. daily.  3. Glucotrol XL 5 mg p.o.  daily.  4. Coumadin as directed.  5. Detrol 4 mg p.o. daily.  6. Diltiazem 120 mg p.o. t.i.d.  7. Tikosyn 250 mcg p.o. b.i.d.  8. Potassium 20 mEq p.o. daily.  9. Lisinopril 5 mg p.o. daily.  10.Atenolol 25 mg p.o. daily.  11.Lasix 20 mg p.o. b.i.d.  12.Glucophage 500 mg p.o. b.i.d.   PHYSICAL EXAMINATION:  VITAL SIGNS:  Blood pressure 125/78, heart rate  64, weight 176 pounds.  NECK:  Normal carotid upstroke.  No carotid bruits.  LUNGS:  Clear breath sounds bilaterally.  HEART:  Regular rate and rhythm.  Normal S1, S2.  No murmurs, rubs or  gallops.  ABDOMEN:  Soft.  EXTREMITIES:  No cyanosis, clubbing or edema.  NEUROLOGICAL:  The patient is alert, oriented and grossly nonfocal.   PROBLEMS:  1. Status post pulmonary vein isolation for atrial fibrillation.      a.     Incomplete procedure.      b.     Complicated by pericardial tamponade but no recurrent       pericardial effusion.  2. Single  vessel coronary artery disease status post MiniVision stent      of distant circumflex.  3. Atrial flutter post #1, resolved.  4. Chronic sleep apnea.  5. Chronic Coumadin therapy.  6. Hypertension.  7. Type 2 diabetes mellitus.   PLAN:  1. The patient's laboratory work, echo and Holter monitor was reviewed      as above.  She is doing quite well on her current beta blocker      therapy and made no changes in her medical regimen.  2. The patient will need to stay at least several months on Tikosyn      therapy.  Will discuss with Dr. Josue Hector on the timing of entering a      drug therapy.  3. The patient can follow up with [place] in three months.     [name], MD,FACC  Electronically Signed    GED/MedQ  DD: 02/18/2007  DT: 02/18/2007  Job #: [id number]

## 2010-07-01 NOTE — Assessment & Plan Note (Signed)
[facility]                          [facility] NOTE   MADDY, GRAHAM                        MRN:          [id number]  DATE:10/03/2007                            DOB:          [birth year]    PRIMARY CARDIOLOGIST:  [name], MD, [facility]   PRIMARY ELECTROPHYSIOLOGIST:  [name]. Ladona Ridgel, MD   Dictation ends at this point.      Gene Serpe, PA-C       Madolyn Frieze. Jens Som, MD, [facility]  Electronically Signed   GS/MedQ  DD: 10/03/2007  DT: 10/04/2007  Job #: [id number]   cc:   Vertis Kelch, NP

## 2010-07-01 NOTE — Assessment & Plan Note (Signed)
[facility]                          [facility] [facility]   [name] [name], [name] [name]                        MRN:          [id number]  DATE:08/01/2007                            DOB:          1945-07-30    REFERRING PHYSICIAN:  [name]. Ladona Ridgel, MD   REFERRING PHYSICIAN:  Health Department   HISTORY OF PRESENT ILLNESS:  The patient is a 65 year old female with a  history of atrial fibrillation, status post pulmonary vein isolation  procedure at [facility].  Postprocedure, the patient still had  intermittent breakthrough tachy palpitations, much improved after  atenolol was increased.  The patient states that her last episode was  approximately 2 weeks ago, was only short-lived.  She denies any chest  pain, shortness of breath, orthopnea, or PND.  EKG in the office  demonstrates sinus bradycardia.   MEDICATIONS:  1. Lisinopril 5 mg p.o. daily.  2. Glucophage 500 mg p.o. b.i.d.  3. Diltiazem 120 b.i.d.  4. Atenolol 25 mg p.o. b.i.d.  5. Glucotrol XL 5 mg p.o. daily.  6. Furosemide 20 mg 2 tablets in a.m.  7. Potassium chloride 20 mEq p.o. daily.  8. Tikosyn 250 mcg p.o. b.i.d.  9. Pravachol 40 mg p.o. daily.   PHYSICAL EXAMINATION:  VITAL SIGNS:  Blood pressure 116/70, heart rate  60, and weight 180 pounds.  NECK:  Normal carotid upstroke and no carotid bruits.  LUNGS:  Clear breath sounds bilaterally.  HEART:  Regular rate and rhythm with normal S1 and S2.  No murmurs,  rubs, or gallops.  ABDOMEN:  Soft and nontender.  No rebound or guarding.  Good bowel  sounds.  EXTREMITIES:  No cyanosis or clubbing.  There is 1+ pitting edema in the  left leg and trace pitting edema in the right leg.  The patient also has  evidence of onchodermatitis consistent with venous insufficiency.   PROBLEMS:  1. Paroxysmal atrial fibrillation/flutter.      a.     Maintaining normal sinus rhythm.      b.     Status post incomplete pulmonary vein isolation  procedure at       [facility], complicated by pericardial tamponade with drain       placement.  2. Chronic Coumadin therapy.  INR 2.6 today.  3. Single-vessel coronary artery disease.      a.     Status post bare metal stent to the circumflex coronary       artery in September 2008.  4. Normal LV function.  5. Type 2 diabetes mellitus.  6. Dyslipidemia.  7. Hypertension.   PLAN:  1. The patient is doing well from a cardiac perspective.  She is due      for a lipid panel and we will also check a CMET to check her LFTs      and potassium levels in light of her Tikosyn therapy.  2. The patient does complain of some myalgias in the evening, which      is likely secondary to venous insufficient.  I do not think it is  related to Pravachol.  3. Coumadin will be followed and I have made no change in the      patient's medical regimen.     [name], MD,FACC  Electronically Signed    GED/MedQ  DD: 08/01/2007  DT: 08/02/2007  Job #: [id number]   cc:   [name]. Ladona Ridgel, MD  of [facility]

## 2010-07-01 NOTE — Assessment & Plan Note (Signed)
[place] HEALTHCARE                         ELECTROPHYSIOLOGY OFFICE NOTE   [name], [name]                        MRN:          [id number]  DATE:05/05/2007                            DOB:          Mar 18, 1945    [name] [name] is a very pleasant, 65 year old woman with a history of  paroxysmal atrial fibrillation and coronary artery disease and atrial  flutter who is status post ablation in the past.  The patient underwent  pulmonary vein isolation at [facility] several weeks ago with the  procedure complicated by pericardial tamponade requiring a pericardial  drain to be placed.  She did not have a full right-sided vein isolation.  She has unfortunately continued to have her current atrial arrhythmias.  She continues to be bothered by palpitations which are not severe, but  are present despite Tikosyn therapy.  The patient notes that her heart  goes out of rhythm almost daily, typically lasting anywhere from 5  minutes to 1 hour.  There is no associated syncope with this.  She does  occasionally get dizzy and lightheaded, but for the most part, her  symptoms are not particularly severe.  She underwent cardiac monitoring  by Dr. Andee Lineman which demonstrates both atrial fibrillation as well as  atrial flutter.  The atrial flutter appearing somewhat atypical in  morphology.  She is referred now for consideration for additional  evaluation and treatment.   MEDICATIONS:  1. Warfarin as directed.  2. Glucotrol XL 5 mg daily.  3. Glucophage 500 twice a day.  4. Detrol LA 4 a day.  5. Aspirin 81 a day.  6. Atenolol 25 daily.  7. Lisinopril 5 mg daily.  8. Pravachol 40 a day.  9. Diltiazem 120 t.i.d.  10.Tikosyn 250 mcg twice daily.   FAMILY HISTORY:  Noncontributory.   SOCIAL HISTORY:  Negative for tobacco or ethanol abuse.   PAST MEDICAL HISTORY:  1. Coronary disease, status post stenting of the left circumflex      coronary artery.  2. Hypertension.  3.  Type 2 diabetes.   REVIEW OF SYSTEMS:  Negative except as noted in the HPI.   PHYSICAL EXAMINATION:  GENERAL:  She is a pleasant, middle-aged, obese  woman in no acute distress.  VITAL SIGNS:  Blood pressure today was 150/67, pulse was 54 and regular,  respirations were 18.  Weight was 181 pounds.  HEENT:  Normocephalic,  atraumatic.  Pupils are round.  Oropharynx moist.  Sclerae anicteric.  There is no thyromegaly.  Trachea is midline.  LUNGS:  Clear bilaterally to auscultation.  No wheezes, rales or rhonchi  are present.  No increased work of breathing.  CARDIAC:  Regular rate and rhythm, normal S1 and S2.  The PMI was not  enlarged, nor was it laterally displaced.  ABDOMEN:  Soft, nontender, nondistended.  No organomegaly.  Bowel sounds  present.  No rebound or guarding.  EXTREMITIES:  No cyanosis, clubbing or edema.  Pulses 2+ symmetric.  NEUROLOGIC:  Alert and oriented x3 with cranial nerves intact.  Strength  was 5/5 and symmetric.  The EKG demonstrates sinus  rhythm with normal  axis intervals.   IMPRESSION:  1. Paroxysmal atrial fibrillation.  2. Paroxysmal atrial flutter.  3. Status post pulmonary vein isolation, procedure incomplete      secondary to pericardial tamponade requiring pericardial drain      placement.  4. Palpitations.  5. Coumadin therapy.  6. Coronary disease, status post stenting.  7. Dyslipidemia.   DISCUSSION:  [name] [name] has made it very clear to me today that she is  not inclined to proceed with another ablation procedure.  With this in  mind, I have recommended two possible treatment options.  One would be  up-titration of her beta-blocker.  While this was not ideal, it would be  very safe and she does have some residual elevation of her blood  pressure.  She does have a mild bradycardia, but I think she would  tolerate increasing her atenolol from 25 mg daily to 25 mg twice a day  with an additional atenolol taken p.r.n.  This would be my first   recommendation for her therapy at least initially.  A second option,  which the patient is not initially inclined to proceed with, would be up-  titration of her Tikosyn.  She had previously been on 500 mg twice  daily, but after her ablation was decreased down to 250 mg twice daily.  Up-titration of the Tikosyn to either 375 twice a day or 500 twice a  day, which would have to be done in the hospital requiring a 4-day  admission, would be a very feasible option.  It might help improve her  and reduce the amount of atrial fibrillation she has had, however, the  patient at this point is not inclined to proceed with this therapy  option.  To this end, I have recommended that we go up on the atenolol  like she noted and I will plan to see her back in our [place] office  in 1-2 months.     [name] Canning. Ladona Ridgel, MD  Electronically Signed    GWT/MedQ  DD: 05/05/2007  DT: 05/06/2007  Job #: [id number]   cc:   [name], MD,FACC

## 2010-07-01 NOTE — Discharge Summary (Signed)
NAMEEVELEAN, BIGLER                 ACCOUNT NO.:  [id number]   MEDICAL RECORD NO.:  [id number]          PATIENT TYPE:  INP   LOCATION:  6533                         FACILITY:  [facility]   PHYSICIAN:  Veverly Fells. Excell Seltzer, MD  DATE OF BIRTH:  [birth year]   DATE OF ADMISSION:  11/01/2006  DATE OF DISCHARGE:  11/02/2006                               DISCHARGE SUMMARY   PRIMARY CARDIOLOGIST:  [name], MD,FACC.   ELECTROPHYSIOLOGIST:  Duke Salvia, MD, [facility].   PRIMARY CARE Deren Degrazia:  Dierdre Forth, PA.   DISCHARGE DIAGNOSES:  Unstable angina and coronary artery disease.   SECONDARY DIAGNOSES:  1. Atrial fibrillation.  2. Hypertension.  3. Type 2 diabetes mellitus.  4. Status post lumpectomy.  5. Status post hysterectomy.  6. Status post ventral hernia repairs.  7. Status post rotator cuff surgery.   ALLERGIES:  CODEINE causes nausea and vomiting.   PROCEDURES:  Successful PCI and stenting of the left circumflex with  placement of  a 2.0 x 12 mm Mini Vision bare metal stent.   HISTORY OF PRESENT ILLNESS:  A 65 year old Caucasian female with the  above problem list.  She recently saw Dr. Sherryl Manges in the office on  October 26, 2006, and reported exertional chest discomfort.  She  subsequently underwent left heart cardiac catheterization September 11  which revealed significant left circumflex stenosis.  She was initially  on Plavix therapy with decision to pursue outpatient PCI on September  15.   HOSPITAL COURSE:  The patient presented to the cardiac catheterization  lab on September 15 and underwent successful PCI and stenting of the  distal left circumflex with placement of a 2.0 x 12 mm Mini Vision bare  metal stent.  She tolerated this procedure well and postprocedure did  have an episode of chest discomfort at approximately 1:00 a.m. on  September 16 which was treated with nitroglycerin and morphine with  relief.  Despite this discomfort, her cardiac markers have  remained  negative, and ECG has had no acute changes.  She has been ambulating  this morning without recurrent symptoms and is being discharged home  today in satisfactory condition.   DISCHARGE LABORATORY DATA:  Hemoglobin 11.1, hematocrit 31.8, WBC 9.4,  platelets 193.  Sodium 136, potassium 4.0, chloride 104, CO2 26, BUN 18,  creatinine 0.74, glucose 149, calcium 8.6. CK 23, MB 0.6, troponin I  0.01.   DISPOSITION:  The patient is being discharged home today in good  condition.   FOLLOWUP PLANS AND APPOINTMENTS:  She will follow up in our [facility] office  with Dr. Antoine Poche on November 16, 2006, at 8:45 a.m.  She will have  Coumadin Clinic followup in our office in [place] on September 19 at 8:45  a.m.  She is asked to follow up with her primary Lamon Rotundo, Dierdre Forth, PA, in 3-4 weeks.   DISCHARGE MEDICATIONS:  1. Aspirin 81 mg daily.  2. Plavix 75 mg daily x30 days.  3. Coumadin as previously prescribed.  4. Pravachol 40  nightly.  5. Glucophage 500 mg b.i.d., to be resumed November 03, 2006.  6. Atacand 15 mg daily.  7. Tikosyn 500 mcg b.i.d.  8. Glucotrol 5 mg daily.  9. Atenolol 50 mg daily.  10.Lisinopril 20 mg b.i.d.  11.Hydrochlorothiazide 25 mg daily.  12.Hydralazine 25 mg 2 tablets 4 times a day.  13.Detrol 4 mg daily.  14.Nitroglycerin 0.4 mg sublingual p.r.n. chest pain.   OUTSTANDING LABORATORY STUDIES:  None.   DURATION OF DISCHARGE ENCOUNTER:  60 minutes including physician time.      Nicolasa Ducking, ANP      Veverly Fells. Excell Seltzer, MD  Electronically Signed    CB/MEDQ  D:  11/02/2006  T:  11/02/2006  Job:  [id number]   cc:   Dierdre Forth, PA

## 2010-07-01 NOTE — Assessment & Plan Note (Signed)
[facility]                          [facility]   [name] [name], [name] [name]                        MRN:          [id number]  DATE:05/09/2007                            DOB:          [birth year]    PRIMARY CARDIOLOGIST:  [name], MD.   PRIMARY ELECTROPHYSIOLOGIST:  [name]. Ladona Ridgel, MD.   REASON FOR VISIT:  Scheduled clinic follow-up.   [name] [name] returns to our clinic after recent evaluation by Dr. Lewayne Bunting for reassessment and further recommendations regarding her  recurrent atrial fibrillation/flutter.  He reviewed a recent Holter  monitor, ordered by Dr. Andee Lineman, which demonstrated evidence of both  atrial fibrillation as well as atrial flutter.  Dr. Ladona Ridgel noted that  the atrial flutter appeared somewhat atypical in morphology.   Dr. Ladona Ridgel suggested the initial option of up-titration of beta-blocker,  versus up-titration of Tikosyn, for suppression of her intermittent  breakthrough tachypalpitations, albeit much less frequent since she  underwent an incomplete radiofrequency ablation procedure in [facility], this past December.  Following consideration of the options, and  the risk/benefits, [name] [name] opted for up-titration of her atenolol.  She is, thus, now on an added dose of atenolol 25 mg to be taken in the  evening, in addition to the morning dose that she had been on in the  past.  Her Tikosyn remains at 250 b.i.d..   The patient has only been on the increased dose of atenolol for a couple  of days, but thus far has not noted any recurrent palpitations.  She is  otherwise hemodynamically stable and without complaint of exertional  chest pain or shortness of breath.   An electrocardiogram, in our office, indicates sinus bradycardia at 49  bpm with left axis deviation and nonspecific ST changes, with LVH by  voltage criteria.   The patient denies any recent development of dizziness, presyncope, or  syncope.   CURRENT MEDICATIONS:  1. Atenolol 25 mg b.i.d.  2. Diltiazem 120 mg b.i.d.  3. Diltiazem 120 mg b.i.d.  4. Tikosyn 0.25 mg b.i.d.  5. Coumadin 5 mg as directed.  6. Pravastatin 40 mg nightly.  7. Aspirin 81 daily.  8. Glucotrol XL 5 daily.  9. Lisinopril 5 daily.  10.Glucophage 500 b.i.d.   PHYSICAL EXAM:  Blood pressure 140/62, pulse 39, regular.  Weight 180  (up 4)  GENERAL:  A 65 year old female sitting upright in no distress.  HEENT:  Normocephalic, atraumatic.  NECK:  Palpable bilateral pulses without bruits; no JVD at 90 degrees.  LUNGS:  Clear to auscultation all fields.  HEART:  Regular rate and rhythm (S1-S2) no significant murmurs.  No  rubs.  ABDOMEN:  Soft, nontender, intact bowel sounds.  EXTREMITIES:  No significant edema.  NEUROLOGIC:  No focal deficit.   IMPRESSION:  1. Paroxysmal atrial fibrillation/flutter.      a.     Documented by recent Holter monitoring.      b.     Status post incomplete pulmonary vein isolation procedure at       [facility], December 2008:  complicated by pericardial tamponade       requiring pericardial drain placement.  2. Chronic Coumadin.  3. Single-vessel coronary artery disease.      a.     Status post bare metal stent of circumflex artery, September       2008.  4. Normal left ventricular function.  5. Type 2 diabetes mellitus.  6. Dyslipidemia.  7. Hypertension.   PLAN:  1. The patient is to continue her current medication regimen, with      plan as recently outlined by Dr. Lewayne Bunting.  She is to maintain      scheduled follow-up with Dr. Lewayne Bunting in [place], but is to      also contact him sooner in the event that she develops worsening      tachypalpitations.  2. We will otherwise plan on having her return to Dr. Andee Lineman for      followup in approximately 3 months.  She is to also followup with      our Coumadin clinic, as previously scheduled.      Gene Serpe, PA-C  Electronically Signed      [name], MD,FACC  Electronically Signed   GS/MedQ  DD: 05/09/2007  DT: 05/09/2007  Job #: [id number]   cc:   [facility] Department Dr. Dierdre Forth

## 2010-07-01 NOTE — Letter (Signed)
October 20, 2006    [name], MD,FACC  [address]  [place], [place] [address]   RE:  [name], [name]  MRN:  [id number]  /  DOB:  February 15, 1946   Dear [name] [name]:   It was a pleasure to see [name] [name] today in consultation today  regarding her atrial fibrillation.   She is a 65 year old woman with a history of atrial fibrillation going  back a couple of years.  These have occurred mostly at night.  There is  some associated rapid ventricular response, accompanying chest tightness  as well as headache.  There is some lightheadedness and diaphoresis when  it starts.  She has been on Tikosyn for these spells for a couple of  years.  We do not have the records as to why this drug was chosen.  She  was also on Digitek but this was discontinued in the spring, and this  has been associated with a marked decrease in the frequency of her  episodes from once per day to once every couple of weeks.  Most of her  spells do occur at night.   She dyspnea on exertion snore.  She has daytime somnolence.  She has not  been evaluated yet for sleep apnea.   She recently has developed exertional chest tightness relieved by rest  and is unaccompanied by shortness of breath or diaphoresis.   Her thromboembolic risk factors and her cardiac risk factors were both  notable for diabetes and hypertension.  She does not take lipid-lowering  therapy.   Her cardiac review of systems is notable for some peripheral edema.   Her past medical history in addition to the above is notable for  lumpectomy.  She has also had a hysterectomy, a ventral hernia repair  and rotator cuff surgery.   REVIEW OF SYSTEMS:  Otherwise broadly negative across multiple organ  systems.   There is mention of a possible anxiety disorder.   SOCIAL HISTORY:  She is married.  She has three kids and three  grandchildren.  She does not use cigarettes, alcohol or recreational  drugs.  She used to work at [employer] but is retired  from that.   Insurance coverage has been an issue.  There were some thoughts to send  her down to Dr. Janey Genta at [facility] but because of her lack of health care  coverage, that could not be done.   CURRENT MEDICATIONS:  1. Aspirin 81 mg.  2. Glucophage 500 mg b.i.d.  3. Coumadin.  4. Atacand 16 mg.  5. Tikosyn 500 mcg b.i.d.  6. Glucotrol 5 mg.  7. Atenolol 50 mg.  8. Lisinopril 20 mg twice a day.  9. Hydrochlorothiazide 25 mg.  10.Hydralazine 40 mg four times a day.   She is allergic to CODEINE.   On examination, her blood pressure is 138/61, her pulse is 57, and she  appears her stated age of 65.  She is 176 pounds.  She is Caucasian.  HEENT:  No icterus or xanthoma.  The neck veins were flat.  Carotids  were brisk and full bilaterally without bruits.  BACK:  Without kyphosis or scoliosis.  LUNGS:  Clear.  Heart sounds were regular but slow.  There is an early systolic murmur.  ABDOMEN:  Protuberant and soft.  No midline pulsation was appreciated.  Femoral pulses were 2+.  Distal pulses were intact.  There was no  clubbing, cyanosis, or edema.  NEUROLOGIC:  Grossly normal.  SKIN:  Warm and dry.   Electrocardiogram dated today demonstrated sinus rhythm at 67 with  intervals of 0.16/0.10/0.52 with a QTC of 0.51.  There was a biphasic T  wave in leads II and III as well as aVF, making it hard to estimate the  QT interval although on further review I get about 570 msec.   IMPRESSION:  1. Paroxysmal atrial fibrillation, mostly nocturnal.  2. Tikosyn therapy for #1.  3. Long QT, questionably related to #2.  4. Recent-onset exertional chest discomfort.  5. Significant snoring and daytime somnolence, question obstructive      sleep apnea.   [name] [name], [name] [name] has atrial fibrillation.  She takes Therapist, occupational.  Her QT  interval is quite prolonged.  We will need to check potassium and  magnesium to make sure that there is not a secondary contribution,  although it would probably be prudent  to decrease her Tikosyn dose and  reassess her QT interval.   In relation to her chest pain, I think given her cardiac risk factors  cardiac catheterization at this point is indicated.   Her atrial fibrillation, interestingly, is primarily nocturnal and it  was much ameliorated by the discontinuation of Digitek which, as you  know, __________ I wonder to what degree sympathovagal balance is  contributing to her nocturnal atrial fibrillation and to that end,  discontinuing her atenolol and putting her on Cardizem 120 mg might  further ameliorate her situation.  I told her that if she has recurrent  atrial fibrillation, she could continue to take her p.r.n. half-dose  atenolol.   The other potential nocturnal contributor is obstructive sleep apnea,  and her history certainly suggests that this may be playing a role.  To  that end, we have arranged for her to have a sleep study up at [facility]  and, hopefully, this will give [place] another therapeutic limb.   In the event that the aforementioned are all inadequate to control her  symptoms, we can consider referral to a center for pulmonary vein  isolation for whom the issue of insurability is less prohibitive.   Thank you for the consultation.    Sincerely,      Duke Salvia, MD, [facility]  Electronically Signed    SCK/MedQ  DD: 10/20/2006  DT: 10/20/2006  Job #: [id number]   CC:    Vertis Kelch

## 2010-07-01 NOTE — Assessment & Plan Note (Signed)
[facility]                         ELECTROPHYSIOLOGY OFFICE NOTE   [name], [name]                        MRN:          [id number]  DATE:08/17/2007                            DOB:          [birth year]    HISTORY:  Mr. [name] [name] returns today for followup.  She is a very pleasant  65 year old woman with a history of atrial fibrillation and coronary  artery disease, who is status post AFib ablation in the past.  I saw her  back in March and she was having recurrent episodes of AFib and we  talked about several options for treatment at that time, one option  being uptitration of her beta-blocker, the second being uptitration of  her Tikosyn, third option being a combination of both.  The time we  decided to uptitrate her beta-blocker and see how she had done.  In the  interim, she has done well.  Her episodes of AFib have gone from once or  twice per day to typically one time per week.  Otherwise, she had no  specific complaints today.   MEDICATIONS:  1. Warfarin as directed.  2. Glucotrol XL 5 a day.  3. Glucophage 500 twice a day.  4. Detrol LA 4 a day.  5. Aspirin 81 a day.  6. Atenolol 25 in the morning and 25 in the p.m. sometimes more p.r.n.  7. Lisinopril 5 mg daily.  8. Pravachol 40 a day.  9. Diltiazem 120 t.i.d.  10.Tikosyn 250 mcg twice daily.   PHYSICAL EXAMINATION:  GENERAL:  She is a pleasant, well-appearing woman  in no distress.  VITAL SIGNS:  Blood pressure was 120/70, the pulse was 60 and regular,  respirations were 18, and the weight 170 pounds.  NECK:  No jugular venous distention.  LUNGS:  Clear bilaterally to auscultation.  No wheezes, rales, or  rhonchi are present.  CARDIOVASCULAR:  Regular rate and rhythm.  Normal S1 and S2.  EXTREMITIES:  Demonstrate no edema.   IMPRESSION:  1. Atrial fibrillation status post ablation with recurrence, now with      much improvement in her symptoms.  2. Diabetes.  3. Hypertension.  4. Chronic Coumadin therapy.   DISCUSSION:  Overall, Ms. [name] [name] is stable.  Her atrial fibrillation is  well controlled.  I have recommend a period of watchful waiting and  continuation of her medical therapy.  I will see her back to the office  in 1 year.     [name]. Ladona Ridgel, MD  Electronically Signed    GWT/MedQ  DD: 08/17/2007  DT: 08/18/2007  Job #: [id number]

## 2010-07-01 NOTE — Cardiovascular Report (Signed)
NAMESUMAN, TRIVEDI                 ACCOUNT NO.:  [id number]   MEDICAL RECORD NO.:  [id number]          PATIENT TYPE:  OIB   LOCATION:  6533                         FACILITY:  [facility]   PHYSICIAN:  Veverly Fells. Excell Seltzer, MD  DATE OF BIRTH:  [birth year]   DATE OF PROCEDURE:  11/01/2006  DATE OF DISCHARGE:                            CARDIAC CATHETERIZATION   PROCEDURE:  1. Percutaneous transluminal cardiac angioplasty and stenting of the      distal left circumflex.  2. StarClose of the right femoral artery.   INDICATIONS:  [name] [name] is a 65 year old woman who has been followed by  Dr. Andee Lineman and Dr. Graciela Husbands.  She has paroxysmal atrial fibrillation and is  on chronic anticoagulation with warfarin.  She has had increasing  exertional and resting chest pain.  She was referred for diagnostic  catheterization last week and was found to have a high-grade 90%  stenosis of the distal left circumflex.  The vessel beyond the lesion  supplied a small area of myocardium, but in the setting of her frequent  and significant chest pain, I elected to schedule her for PCI.  I  planned on a bare metal stent because of the small vessel size as well  as her need for chronic anticoagulation with Coumadin.   Risks and indications of the procedure were reviewed with the patient.  Informed consent was obtained.  The right groin was prepped, draped,  anesthetized with 1% lidocaine.  The 6-French sheath was placed in the  right femoral artery using the modified Seldinger technique.  A  6-  Jamaica XB 3.5 coronary guide catheter was inserted.  Angiomax was used  for anticoagulation.  Once a therapeutic ACT was achieved, a BMW  guidewire was passed into the distal left circumflex without too much  difficulty.  The lesion was predilated with a 2.0 x 10 mm Fire Star  balloon up to 8 atmospheres.  Following balloon dilatation, there was  TIMI-3 flow in the vessel, but there was recoil of the lesion and  remained at 90%  focal stenosis.  I elected to stent the vessel with a  2.0 x 12 mm Mini Vision stent which was deployed at 12 atmospheres.  The  stent was well expanded, and there was TIMI-3 flow in the vessel after  stenting.  The stent was then postdilated with a 2.25 x 10 mm Dura Star  which was inflated to 14 atmospheres on one inflation to cover the  stented segment.  Following postdilatation, there was TIMI-3 flow in the  vessel. The stent was well expanded.  There was some downstream  vasospasm, but there was good blush and a good angiographic result.  [name] had chest pain throughout the procedure without EKG changes.  She  otherwise tolerated the procedure well and was hemodynamically stable  throughout.  At the completion of the procedure, a StarClose device was  used to seal the femoral arteriotomy.  There were no immediate  complications.  The patient will be observed overnight with probable  discharge in the morning.   ASSESSMENT:  Successful percutaneous  coronary intervention of the distal  left circumflex with a 2.0 Mini Vision bare metal stent.   RECOMMENDATIONS:  1. Aspirin 81 mg lifelong.  2. Plavix 75 mg daily for 30 days.  3. Will resume Coumadin tonight.      Veverly Fells. Excell Seltzer, MD  Electronically Signed     MDC/MEDQ  D:  11/01/2006  T:  11/01/2006  Job:  [id number]

## 2010-07-01 NOTE — Assessment & Plan Note (Signed)
[facility]                          [place] [facility] OFFICE NOTE   NAMINE, BEAHM                        MRN:          [id number]  DATE:01/31/2007                            DOB:          Jan 02, 1946    REFERRING PHYSICIAN:  Dierdre Forth, [facility]   HISTORY OF PRESENT ILLNESS:  The patient is a 65 year old female with  paroxysmal atrial fibrillation.  The patient was referred to [facility] and underwent pulmonary vein isolation.  Unfortunately this  procedure was complicated by pericardial tamponade requiring drain  placement.  The procedure was incomplete and, in talking with Dr. Herbert Deaner,  it appeared that only approximately 80% of ablation lines were  completed.  The patient did not have a right-sided flutter line placed.  She now, unfortunately, presents with recurrent palpitations and has  evidence of type I flutter on EKG.  She states that, particularly in the  mornings and the evenings, she has brief episodes of palpitations.   MEDICATIONS:  1. Lisinopril 5 mg p.o. daily.  2. Pravastatin 40 mg nightly.  3. Aspirin 81 mg p.o. daily.  4. Glucophage 500 mg 2 tablets daily.  5. Coumadin as directed.  6. Glucotrol XL 5 mg p.o. daily.  7. Dietrol 4 mg p.o. daily.  8. Atenolol p.r.n.  9. Diltiazem 120 mg p.o. t.i.d.  10.Tikosyn 250 mcg p.o. b.i.d.  11.Potassium 20 mEq p.o. daily.  12.Lasix 20 mg p.o. daily.   PHYSICAL EXAMINATION:  VITAL SIGNS:  Blood pressure 134/85, heart rate  is 68 beats per minute.  Weight 172 pounds.  NECK:  Normal carotid upstroke.  No carotid bruits.  LUNGS:  Clear breath sounds bilaterally.  HEART:  Irregular rate and rhythm.  Normal S1, S2.  No murmurs, rubs, or  gallops.  ABDOMEN:  Soft.  EXTREMITIES:  No cyanosis, clubbing, or edema.  NEURO:  The patient is alert and oriented, grossly nonfocal.   PROBLEM LIST:  1. Status post pulmonary vein isolation for atrial fibrillation.      a.      Incomplete procedure.      b.     Complicated by pericardial tamponade.  2. Single-vessel coronary artery disease status post MiniVisoin stent      to the distal circumflex.  3. Recurrent atrial flutter post PVI.  4. Rule out sleep apnea.  5. Chronic Coumadin therapy.  6. Hypertension.  7. Type 2 diabetes mellitus.   PLAN:  1. I have discussed the situation with Dr. Herbert Deaner.  The patient is very      reluctant to consider another procedure.  We will proceed in the      next couple of weeks with a cardioversion.  2. The patient does not need a repeat TEE as she underwent TEE and she      was fully anticoagulated after this.  3. The patient will be given p.r.n. atenolol.  4. She will also have a chest x-ray and echocardiogram study to follow      up on her pericardial effusion.  5. PT/INR will be obtained.  The patient  remains therapeutic.  6. I will schedule the patient in the next couple of days, possibly      next week, for a synchronized cardioversion.  This was discussed      with Dr. Herbert Deaner.     [name], MD,FACC  Electronically Signed    GED/MedQ  DD: 01/31/2007  DT: 02/01/2007  Job #: [id number]   cc:   Dierdre Forth

## 2010-07-01 NOTE — Letter (Signed)
February 27, 2008    [name]. Ladona Ridgel, MD  [address]  [address]  [place], [place] [address]   RE:  NASRIN, LANZO  MRN:  [id number]  /  DOB:  [birth year]   Dear [name] [name],   It was my pleasure to see your patient [name] [name] today for  consideration of catheter ablation for atrial fibrillation.  As you  recall, she is a very pleasant 65 year old female with a history of  paroxysmal atrial fibrillation status post EP study and radiofrequency  ablation and [facility] in December 2008.  She reports longstanding  symptoms of palpitations which initially occurred several times per day.  She subsequently underwent atrial fibrillation ablation.  During the  procedure, she developed left atrial perforation and tamponade for which  the procedure was abandoned and a pericardial drain was placed.  She did  well thereafter except for periprocedural pericarditis.  She has  subsequently been treated with Tikosyn.  Despite of this medical  therapy, she continues to have episodes of atrial fibrillation several  times per week.  She notes that they last 10-15 minutes and are  associated with heart racing, shortness of breath, and fatigue.  She  takes atenolol also for these episodes without relief.  She is  chronically without relief.  She therefore presents today for  consideration of atrial fibrillation ablation.   PAST MEDICAL HISTORY:  1. Atrial fibrillation (as above).  2. Atrial flutter previously documented on EKG without flutter      ablation previously.  3. Coronary artery disease status post bare-metal stent of the left      circumflex artery in September 2008.  4. Preserved ejection fraction.  5. Type 2 diabetes mellitus.  6. Hyperlipidemia.   CURRENT MEDICATIONS:  1. Aspirin 81 mg daily.  2. Glucotrol XL 5 mg daily.  3. Detrol LA 4 mg daily.  4. Lisinopril 5 mg daily.  5. Atenolol 25 mg b.i.d.  6. Diltiazem 120 mg b.i.d.  7. Tikosyn 240 mg b.i.d.  8. Glucophage 500 mg b.i.d.  9. Pravastatin 40 mg nightly.  10.Coumadin.  11.Potassium chloride 20 mEq daily.   ALLERGIES:  CODEINE causes nausea and vomiting.   SOCIAL HISTORY:  The patient denies tobacco, alcohol, or drug use.  She  is retired.   FAMILY HISTORY:  Unremarkable.   REVIEW OF SYSTEMS:  All systems are reviewed and negative except as  outlined in the HPI above.   PHYSICAL EXAMINATION:  VITAL SIGNS:  Blood pressure 120/78, heart rate  65, respirations 18, weight 184 pounds.  GENERAL:  The patient is a well-appearing female in no acute distress.  She is alert and oriented x3.  HEENT:  Normocephalic, atraumatic.  Sclerae are clear.  Conjunctivae  pink.  Oropharynx is clear.  NECK:  Supple.  No JVD, lymphadenopathy, or bruits.  LUNGS:  Clear to auscultation bilaterally.  HEART:  Regular rate and rhythm.  No murmurs, rubs, or gallops.  GI:  Soft, nontender, and nondistended.  Positive bowel sounds.  EXTREMITIES:  No clubbing, cyanosis, or edema.  NEUROLOGIC:  Nonfocal.  SKIN:  No ecchymosis or lacerations.  MUSCULOSKELETAL:  No deformity or atrophy.  PSYCHIATRIC:  Euthymic mood, full affect.   DIAGNOSTIC STUDIES:  EKG reveals sinus rhythm today.   IMPRESSION:  [name] [name] is a pleasant 65 year old female with a history  of atrial fibrillation and typical-appearing atrial flutter status post  EP study and radiofrequency ablation in [facility] which was abandoned due to  pericardial  tamponade.  She now presents today for consideration of  repeat catheter ablation for atrial fibrillation.  She clearly has  symptomatic atrial arrhythmias despite medical therapy with Tikosyn and  atenolol.  Therapeutic strategies for atrial fibrillation including both  medicine and catheter-based therapies were discussed in detail with the  patient today.  Risks, benefits, and alternatives to EP study and  radiofrequency ablation for atrial fibrillation were also discussed  today.  These risk include, but are not limited to  stroke, bleeding,  vascular damage, pericardial effusion with tamponade, cardiac  perforation, damage to the esophagus, lungs, and another surrounding  structures, pulmonary vein stenosis.  The patient understands these  risks and wishes to proceed.  We will therefore schedule her for repeat  EP study and radiofrequency ablation for atrial fibrillation and typical-  appearing atrial flutter in the near future.   Thank you Dr. Ladona Ridgel for the opportunity of participating in the care of  [name] [name].  Please feel free to contact me if you wish to discuss her  care further.    Sincerely,      Hillis Range, MD  Electronically Signed    JA/MedQ  DD: 02/27/2008  DT: 02/28/2008  Job #: [id number]

## 2010-07-01 NOTE — Cardiovascular Report (Signed)
[name], [name]                 ACCOUNT NO.:  [id number]   MEDICAL RECORD NO.:  [id number]          PATIENT TYPE:  OIB   LOCATION:  1962                         FACILITY:  [facility]   PHYSICIAN:  Veverly Fells. Excell Seltzer, MD  DATE OF BIRTH:  Dec 23, 1945   DATE OF PROCEDURE:  10/28/2006  DATE OF DISCHARGE:  10/28/2006                            CARDIAC CATHETERIZATION   PROCEDURE:  1. Left heart catheterization.  2. Selective coronary angiography.  3. Left ventricular angiography.   INDICATIONS:  [name] [name] is a delightful 65 year old woman referred by  Dr. Graciela Husbands for coronary angiography.  She has had a history of atrial fib  and has been treated with Tikosyn.  On her evaluation by Dr. Graciela Husbands, she  has complained of exertional chest discomfort.  She is also had some  resting chest discomfort.  In the setting of cardiovascular risk  factors, she was referred for diagnostic catheterization.   Risks and indications of the procedure were reviewed with the patient.  Informed consent was obtained.  Using the modified Seldinger technique,  a 4-French sheath was placed in the right femoral artery.  Multiple  views of the right and left coronary arteries were taken with standard 4-  Jamaica Judkins preformed catheters.  Following coronary angiography, an  angled pigtail catheter was placed in the left ventricle, where  pressures were recorded.  The left ventriculogram was performed and  pullback across the aortic valve was done.  The patient has some chest  discomfort with angiography and she was given 1 spray of nitroglycerin,  which alleviated her pain.   FINDINGS:  Aortic pressure 128/49 with a mean of 79, left ventricular  pressure 133/13.   CORONARY ANGIOGRAPHY:  The left mainstem has minor luminal irregularity,  but is widely patent.  It trifurcates into the LAD intermediate branch  and left circumflex.   The LAD is a large-caliber vessel that courses down and wraps around the  left  ventricular apex.  The proximal aspect of the LAD has minor  irregularity with 30% stenosis.  The first diagonal branch is very  small.  The second diagonal branch is medium size and bifurcates into  twin vessels.  The mid and distal portions of the LAD have no  significant angiographic stenosis.   The intermediate branch is a medium-size vessel.  There is no  significant angiographic stenosis.   The left circumflex is a large-caliber vessel.  It is dominant.  It  courses down the AV groove and supplies a left PDA as well as a  posterolateral branch.  In the posterolateral branch at the junction of  the distal left circumflex and posterolateral branch, there is a severe  90% focal stenosis.  Beyond the stenosis is a supply to a relatively  small portion of myocardium.  There is no other significant angiographic  stenosis in the left circumflex, although there are some luminal  irregularities throughout the vessel.   The right coronary artery is small; it is nondominant; it courses down  and supplies a small acute marginal branch.  There is no significant  angiographic stenosis,  but there are luminal irregularities throughout.   Left ventricular function assessed by ventriculography is normal.  The  LVEF is estimated at 65% to 70%.  There is no mitral regurgitation.   ASSESSMENT:  1. Severe distal left circumflex stenosis.  2. Mild nonobstructive disease in the left anterior descending and      right coronary artery.  3. Normal left ventricular systolic function.   [name] [name] is having angina.  She has a severe stenosis in the distal  left circumflex.  While it does not supply a large area of myocardium,  her symptoms are compelling.  She had chest discomfort with angiography  that reproduced her symptoms at home.  I would favor percutaneous  intervention of the focal lesion in the distal left circumflex to see if  we can improve her angina.  Otherwise, she should respond well to   medical therapy for her coronary artery disease.  We will preload her  with clopidogrel and plan on PCI next week.  The findings were reviewed  with Dr. Graciela Husbands.      Veverly Fells. Excell Seltzer, MD  Electronically Signed     MDC/MEDQ  D:  10/28/2006  T:  10/29/2006  Job:  [id number]   cc:   Duke Salvia, MD, [facility]  [name], MD,FACC

## 2010-07-01 NOTE — Assessment & Plan Note (Signed)
[facility]                          [place] [facility] OFFICE NOTE   [name], [name]                        MRN:          [id number]  DATE:01/31/2008                            DOB:          [birth year]    HISTORY OF PRESENT ILLNESS:  A 65 year old female with history of atrial  fibrillation with breakthrough paroxysms.  She is status post incomplete  pulmonary vein isolation at [facility].  The patient states that she  still is on a weekly basis about episodes that last approximately 10  minutes of palpitations for which she takes atenolol, usually about 15  minutes thereafter the episodes is aborted.  The episodes occur either  on weekly basis or biweekly basis.  She saw Dr. Ladona Ridgel recently and  followup was suggested to her that she might need to consider a repeat  pulmonary vein isolation to complete the procedure.  However, the  patient will need to continue on Coumadin and may require Tikosyn for  prolong period after the procedure, she is aware of this.   The patient also reports that on exertion.  She has some substernal  chest pressure.  She has 2 different types of pain.  She has a right-  sided pain which is sharp in nature during her episodes of palpitations.  She also has heavy substernal chest pressure with radiation to the neck  area.   MEDICATIONS:  1. Aspirin 81 mg p.o. daily.  2. Glucotrol XL 5 mg p.o. daily.  3. Detrol LA 4 mg p.o. daily.  4. Lisinopril 5 mg p.o. daily.  5. Atenolol 25 mg p.o. b.i.d.  6. Diltiazem 120 mg p.o. b.i.d.  7. Tikosyn 250 mg b.i.d.  8. Glucophage 500 mg p.o. b.i.d.  9. Pravastatin 40 mg nightly.  10.Coumadin 5 mg as directed.  11.Potassium chloride 20 mEq p.o. daily.   PHYSICAL EXAMINATION:  VITAL SIGNS:  Blood pressure 125/73, heart rate  52, and weight 183 pounds.  NECK:  Normal carotid upstroke.  No carotid bruits.  LUNGS:  Clear breath sounds bilaterally.  HEART:  Regular rate and rhythm  with bradycardic and no abnormal heart  sounds.  ABDOMEN:  Soft and nontender.  No rebound or guarding.  Good bowel  sounds.  EXTREMITY:  No cyanosis, clubbing, or edema.  NEUROLOGIC:  The patient is alert, oriented, and grossly nonfocal.   PROBLEM LIST:  1. Paroxysmal atrial fibrillation/flutter.      a.     Normal sinus rhythm but with breakthrough episodes.      b.     Status post incomplete pulmonary vein isolation procedure at       [facility], complicated by pericardial tamponade with drain       placement.  2. Chronic Coumadin therapy.  3. Single-vessel coronary artery disease.      a.     Recurrent substernal chest pain, near palpitations.      b.     Status post bare-metal stent to circumflex coronary artery       in September 2008.  4. Normal left ventricular function.  5.  Type 2 diabetes mellitus.  6. Dyslipidemia.  7. Abnormal EKG with a biphasic T-waves in the anterior leads.   PLAN:  1. The patient will need a stress echocardiographic study prior to      consideration of pulmonary vein isolation.  She does report chest      pain with jaw pain during palpitations.  There is also some new      changes on her electrocardiogram.  2. The patient will need to hold her beta-blocker and calcium-channel      blocker prior to exercise echocardiographic testing, which will be      done later this week.  3. The patient will discuss further with Dr. Ladona Ridgel if she wants to      proceed with a repeat pulmonary vein isolation procedure by Dr.      Johney Frame.     [name], MD,FACC  Electronically Signed    GED/MedQ  DD: 01/31/2008  DT: 02/01/2008  Job #: [id number]   cc:   [name]. Ladona Ridgel, MD  Health Department

## 2010-07-03 ENCOUNTER — Encounter: Payer: Self-pay | Admitting: Internal Medicine

## 2010-07-03 ENCOUNTER — Ambulatory Visit (INDEPENDENT_AMBULATORY_CARE_PROVIDER_SITE_OTHER): Payer: Medicare Other | Admitting: Internal Medicine

## 2010-07-03 DIAGNOSIS — I495 Sick sinus syndrome: Secondary | ICD-10-CM

## 2010-07-03 DIAGNOSIS — I4892 Unspecified atrial flutter: Secondary | ICD-10-CM

## 2010-07-03 DIAGNOSIS — I10 Essential (primary) hypertension: Secondary | ICD-10-CM

## 2010-07-03 DIAGNOSIS — I4891 Unspecified atrial fibrillation: Secondary | ICD-10-CM

## 2010-07-03 NOTE — Progress Notes (Signed)
The patient presents today for routine electrophysiology followup.  Since last being seen in our clinic, the patient reports doing very well.  She remains quite active.  She has occasional BLE edema which is stable.  She feels that her afib is controlled with tikosyn.  She is tolerating Xarelto without difficulty.  Today, she denies symptoms of palpitations, chest pain, shortness of breath, orthopnea, PND, dizziness, presyncope, syncope, or neurologic sequela.  The patient feels that she is tolerating medications without difficulties and is otherwise without complaint today.   Past Medical History  Diagnosis Date  . Atrial fibrillation   . Atrial flutter   . Coronary artery disease     status post BMS of the left circ in september 2008  . Diabetes mellitus     type II  . Hyperlipidemia   . Multiple thyroid nodules     per patient  . 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 Ventral hernia repair   . Rotator cuff repair   . Vesicovaginal fistula closure w/ tah   . Breast lumpectomy   . S/p ppm     for syncope/tachybrady syndrome (MDT) by Fawn Kirk    Current Outpatient Prescriptions  Medication Sig Dispense Refill  . aspirin 81 MG tablet Take 81 mg by mouth daily.        Marland Kitchen atenolol (TENORMIN) 50 MG tablet Take 50 mg by mouth every morning.        . bumetanide (BUMEX) 2 MG tablet Take 2 mg by mouth every morning.       . diltiazem (CARDIZEM CD) 360 MG 24 hr capsule Take 360 mg by mouth daily.        Marland Kitchen dofetilide (TIKOSYN) 500 MCG capsule Take 1 capsule (500 mcg total) by mouth 2 (two) times daily.  180 capsule  3  . glipiZIDE (GLUCOTROL) 10 MG tablet Take 10 mg by mouth. Take 1 tablet in the AM an 1/2 tablet in the PM       . nitroGLYCERIN (NITROSTAT) 0.4 MG SL tablet Place 0.4 mg under the tongue every 5 (five) minutes as needed.        . potassium chloride SA (K-DUR,KLOR-CON) 20 MEQ tablet Take 1 tablet (20 mEq total)  by mouth 2 (two) times daily.  180 tablet  3  . pravastatin (PRAVACHOL) 40 MG tablet Take 1 tablet (40 mg total) by mouth daily.  90 tablet  3  . Rivaroxaban (XARELTO) 20 MG TABS Take 1 tablet by mouth daily.  90 tablet  3  . saxagliptin HCl (ONGLYZA) 5 MG TABS tablet Take 5 mg by mouth daily.          Allergies  Allergen Reactions  . Codeine     REACTION: vomiting  . Metformin     REACTION: rash    History   Social History  . Marital Status: Married    Spouse Name: N/A    Number of Children: N/A  . Years of Education: N/A   Occupational History  . Not on file.   Social History Main Topics  . Smoking status: Never Smoker   . Smokeless tobacco: Not on file  . Alcohol Use: No  . Drug Use:   . Sexually Active:    Other Topics Concern  . Not on file   Social History Narrative  . No narrative on file    Physical Exam: Filed Vitals:  07/03/10 1014  BP: 118/68  Pulse: 68  Height: 5\' 3"  (1.6 m)  Weight: 190 lb (86.183 kg)    GEN- The patient is well appearing, alert and oriented x 3 today.   Head- normocephalic, atraumatic Eyes-  Sclera clear, conjunctiva pink Ears- hearing intact Oropharynx- clear Neck- supple, no JVP Lymph- no cervical lymphadenopathy Lungs- Clear to ausculation bilaterally, normal work of breathing Chest- pacemaker pocket is well healed Heart- Regular rate and rhythm, no murmurs, rubs or gallops, PMI not laterally displaced GI- soft, NT, ND, + BS Extremities- no clubbing, cyanosis, trace edema MS- no significant deformity or atrophy Skin- no rash or lesion Psych- euthymic mood, full affect Neuro- strength and sensation are intact  Pacemaker interrogation- reviewed in detail today,  See PACEART report EKG today reveals a paced rhythm 68 bpm, nonspecifc St/T changes, Qtc 488   Assessment and Plan:

## 2010-07-03 NOTE — Assessment & Plan Note (Signed)
Normal pacemaker function See Pace Art report No changes today  

## 2010-07-03 NOTE — Patient Instructions (Signed)
Your physician recommends that you schedule a follow-up appointment in: 6 months with kristin  and paula in [place] office.   Your physician recommends that you continue on your current medications as directed. Please refer to the Current Medication list given to you today.

## 2010-07-03 NOTE — Assessment & Plan Note (Signed)
Salt restriction advised No changes today 

## 2010-07-03 NOTE — Assessment & Plan Note (Signed)
Maintaining sinus rhythm with tikosyn. Only 4% afib by pacemaker interrogation today. Continue Xarelto longterm for stroke prevention.

## 2010-07-04 NOTE — Assessment & Plan Note (Signed)
[facility]                          [place] [facility] OFFICE NOTE   BRISEYDA, FEHR                        MRN:          [id number]  DATE:03/12/2006                            DOB:          [birth year]    HISTORY OF PRESENT ILLNESS:  The patient is a 65 year old female with a  history of hypertension, diabetes, as well as paroxysmal atrial  fibrillation.  The patient has had previous breakthroughs on Tikosyn  therapy and at one point was considered for pulmonary vein isolation,  however she was turned down because of insurance reasons.  Unfortunately, the patient now has had more prolonged spells where she  has had no breakthrough episodes of atrial fibrillation.  She does  complain of some fatigue and weakness, and she has underlying  bradycardia now on her current medical regimen.  She denies any chest  pain, orthopnea, PND, or syncope.   MEDICATIONS:  1. Atenolol 75 mg a day.  2. Glucotrol.  3. Tikosyn 5 mg q. 12.  4. Atacand 60 mg p.o. a day.  5. Coumadin as directed.  6. Detrol LA.  7. Glucophage 5 mg p.o. a day.  8. Lisinopril 20 mg p.o. a day.  9. Aspirin 81 mg a day.  10.Digitek 0.125 mg p.o. a day.   PHYSICAL EXAMINATION:  VITAL SIGNS:  Blood pressure 140/90, heart rate  is 76, weight is 173 pounds.  NECK:  Normal carotid upstroke, no carotid bruits.  LUNGS:  Clear, breath sounds bilaterally.  HEART:  Regular rate and rhythm, normal S1, S2, no murmur, rubs, or  gallops.  ABDOMEN:  Soft.  EXTREMITIES:  No cyanosis, clubbing, or edema.   PROBLEMS:  1. Atrial fibrillation.      a.     Is recurrent on Tikosyn therapy.      b.     Currently in normal sinus rhythm but with significant       bradycardia.  2. Weakness, possibly secondary to 1B.  3. History of substernal chest pain with negative Cardiolite study.  4. Coumadin therapy, international normalized ratio 4.5.  5,  Diabetes mellitus.  1. Hypertension, controlled.  2. QTc  within normal limits.   PLAN:  1. The patient has rare breakthrough episodes of atrial fibrillation.  2. Will continue on Tikosyn therapy but cut back her atenolol to 50 mg      a day.  3. If the patient has more breakthrough episodes with lower dose of      beta blocker we may need to consider her for pacemaker      implantation.  She ultimately may also benefit from AV nodal      ablation but will discuss this with Dr. Graciela Husbands if the patient has      worsening bradycardia on medical therapy.  4. A dig level will be done, as well as a BMET.  5. I have made also a refill on the patient's medication.     [name], MD,FACC  Electronically Signed    GED/MedQ  DD: 03/12/2006  DT: 03/12/2006  Job #: [id number]

## 2010-07-04 NOTE — Discharge Summary (Signed)
[name], [name]                 ACCOUNT NO.:  [id number]   MEDICAL RECORD NO.:  [id number]          PATIENT TYPE:  INP   LOCATION:  2033                         FACILITY:  [facility]   PHYSICIAN:  Arvilla Meres, M.D. [facility] OF BIRTH:  [birth year]   DATE OF ADMISSION:  01/13/2005  DATE OF DISCHARGE:  01/15/2005                           DISCHARGE SUMMARY - REFERRING   DISCHARGE DIAGNOSES:  1.  Paroxysmal atrial fibrillation.  2.  Tikosyn loading.  3.  Hyperglycemia with elevated hemoglobin A1c.  4.  History of diabetes.   SUMMARY OF HISTORY:  [name] [name] is a 65 year old female with a history of PAF  who presents to the [facility] office with an unscheduled visit secondary to  recurrent symptoms. She states that her palpitations leave her fatigued,  short of breath, and occasional chest discomfort. PDF had called Dr.  Gala Romney over the weekend stating that she had had a 36-hour episode of  atrial fibrillation with ventricular rates between 130 and 150. Despite  taking an extra dose of atenolol, her heart rate remained elevated. She was  referred to [facility] emergency room; however, there she converted to sinus  bradycardia and she was discharged home. With her recurring PAF, Dr.  Gala Romney discussed with her Tikosyn loading, thus her admission.   PAST MEDICAL HISTORY:  Notable for anticoagulation secondary to atrial  fibrillation. EF is approximately 60% to 65% by echocardiogram. Negative  Cardiolite in 2005. Diabetes, hypertension, obesity, and medication  noncompliance.   LABORATORY DATA:  Admission weight was 179.8. H&H 13.5 and 39.0, normal  indices. Platelets 193,000, WBC 7.7. On admission potassium was 4.3, BUN 21,  creatinine 0.18, glucose 227. Normal LFTs. Sodium 140. Potassium on the 29th  was 3.7. Magnesium on admission was 2.1. On the 30th, sodium was 140,  potassium 4.0, BUN 12, creatinine 0.7, glucose 156, magnesium 2.0.  Hemoglobin A1c was elevated at 7.3. TSH 1.615.  Digoxin 0.2.   EKG on admission showed a QTc of 430. QTc intervals ranged from 430 to 488.  She did have one EKG on the 29th that showed a QTc of 536. However, this was  an unconfirmed EKG. Final EKG post her last dose of Tikosyn in the hospital  is pending at the time of this dictation.   HOSPITAL COURSE:  [name] [name] was admitted to 2000 and started on Tikosyn 50  mg b.i.d. after the required labs were performed. Case management assisted  with paperwork in regards to Tikosyn assistance forms. Throughout her  hospitalization she did well. She did have a couple of episodes of  palpitations and telemetry did show some short runs of atrial fibrillation.   DISPOSITION:  She is advised to maintain a low-salt, low-fat, and  cholesterol diet. She is advised to bring all medications to all  appointments. Her new medications include Tikosyn 500 mg b.i.d. and  lisinopril 20 mg daily. Her atenolol was decreased to 50 mg daily. She was  asked to continue her aspirin 81 mg daily, digoxin 0.125 mg daily, Detrol 4  mg daily, Atacand 16 mg daily, Glucophage 500 mg b.i.d., and Coumadin 2.5  mg  daily. She will have a PT-INR on January 23, 2005, at 8:30 a.m. in the [place]  office. She will follow up with Dr. Gala Romney on January 26, 2005, at 1  p.m. in the [place] office. She was instructed under no circumstances that she  should be taking the  medication hydrochlorothiazide since she is on Tikosyn. She is asked to make  her primary care and the [facility] Main Department aware of this.   DISCHARGE TIME:  Less than 30 minutes.      Joellyn Rued, P.A. [facility]      Arvilla Meres, M.D. [facility]  Electronically Signed    EW/MEDQ  D:  01/15/2005  T:  01/15/2005  Job:  [id number]   cc:   Dr. Donzetta Kohut

## 2010-07-04 NOTE — Assessment & Plan Note (Signed)
[name] [name] [name]                          [place] [facility] OFFICE NOTE   [name], [name]                        MRN:          [id number]  DATE:05/14/2006                            DOB:          1945/06/29    PRIMARY CARDIOLOGIST:  Dr. Vernie Shanks. [place]   REASON FOR VISIT:  Scheduled two-month followup.  Please refer to Dr.  Margarita Mail clinic note of January 2008 for full details.   Since last seen in the clinic, [name] [name] continues to do well from a  clinical standpoint with only five break-through episodes of  fluttering.  As instructed, she took a half tablet of Atenolol in each  case, with resolution of her tachy palpitations in approximately 10-15  minutes.  She had no significant associated symptoms.   Patient otherwise continues to do well and has no significant change  from her baseline level of exercise tolerance.  She denies any  exertional chest discomfort and has no history of documented coronary  artery disease.   CURRENT MEDICATIONS:  1. Aspirin 81 daily.  2. Digitek 0.125 daily.  3. Lisinopril 20 daily.  4. Atacand 16 daily.  5. Glucophage 1000 daily.  6. Detrol-LA 4 daily.  7. Coumadin 5 mg as directed.  8. Tikosyn 500 mg q. 12 hours.  9. Glucotrol XL 5 daily.  10.Atenolol 50 daily.   Electrocardiogram today reveals sinus bradycardia at 51 BPM with  frequent PACs.   PHYSICAL EXAMINATION:  Blood pressure 169/71, pulse 43, irregular.  Weight 170.8.  GENERAL:  A 65 year old female, moderately obese, sitting upright, in no  distress.  HEENT:  Normocephalic, atraumatic.  NECK:  Palpable bilateral carotid pulses without bruits; no JVD.  LUNGS:  Clear to auscultation in all fields.  HEART:  Regular rate and rhythm with occasional premature beat.  No  significant murmurs.  ABDOMEN:  Protuberant, nontender.  EXTREMITIES:  No significant edema.  NEUROLOGIC:  No focal deficit.   IMPRESSION:  1. History of paroxysmal atrial  fibrillation.      a.     Maintaining sinus bradycardia on Tikosyn and Atenolol.  2. Chronic Coumadin.  3. Hypertension.  4. Type 2 diabetes mellitus.   PLAN:  1. Discontinue Digoxin, in light of significant persistent bradycardia      and history of normal left ventricular function.  2. Increase lisinopril to 20 b.i.d. for better blood pressure control.  3. Followup BMET in one week.  4. Schedule clinic followup with Dr. Andee Lineman in three months.      [name] Serpe, PA-C  Electronically Signed      [name], MD,FACC  Electronically Signed   GS/MedQ  DD: 05/14/2006  DT: 05/14/2006  Job #: [id number]

## 2010-09-24 ENCOUNTER — Ambulatory Visit: Payer: Medicare Other | Admitting: Vascular Surgery

## 2010-11-21 IMAGING — CR DG CHEST 2V
2 series · 2 of 2 positions shown · non-contrast
Comparison: 03/15/2009.

CLINICAL DATA: Generalized weakness.

CHEST - 2 VIEW

[w chest pa *]
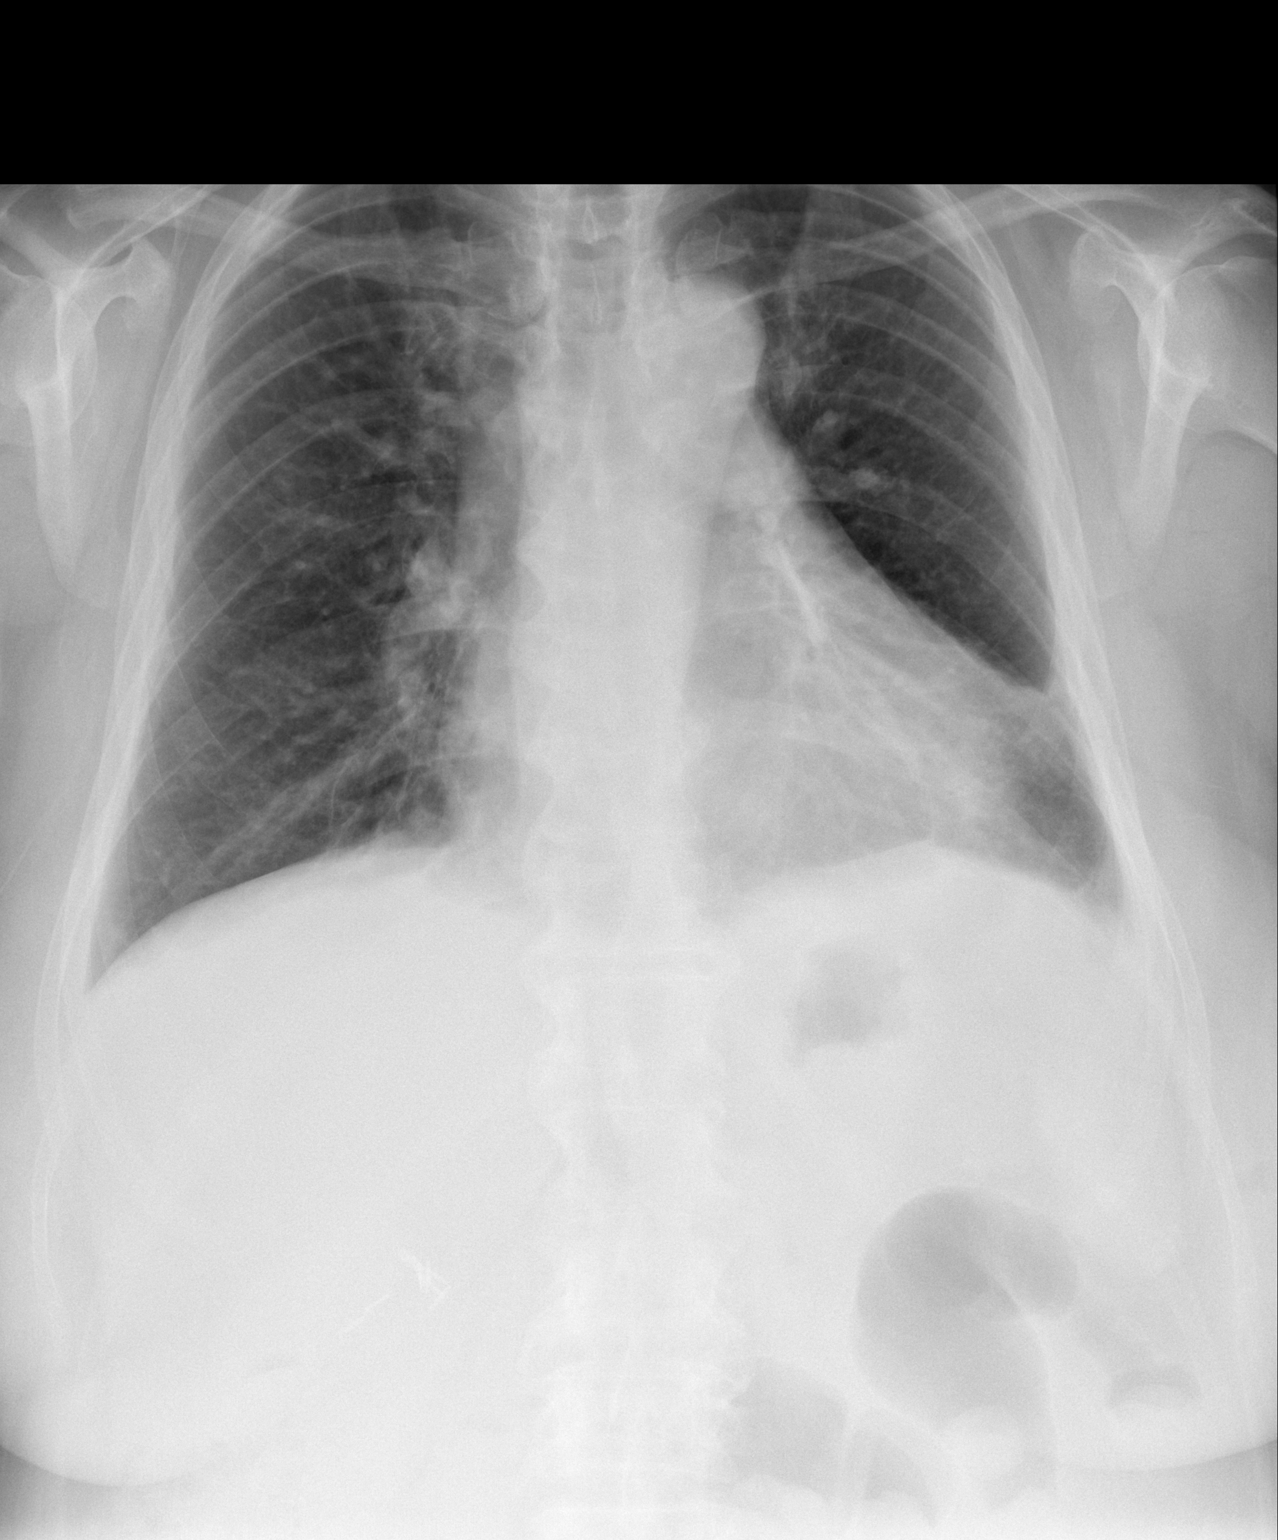

[w chest lat *]
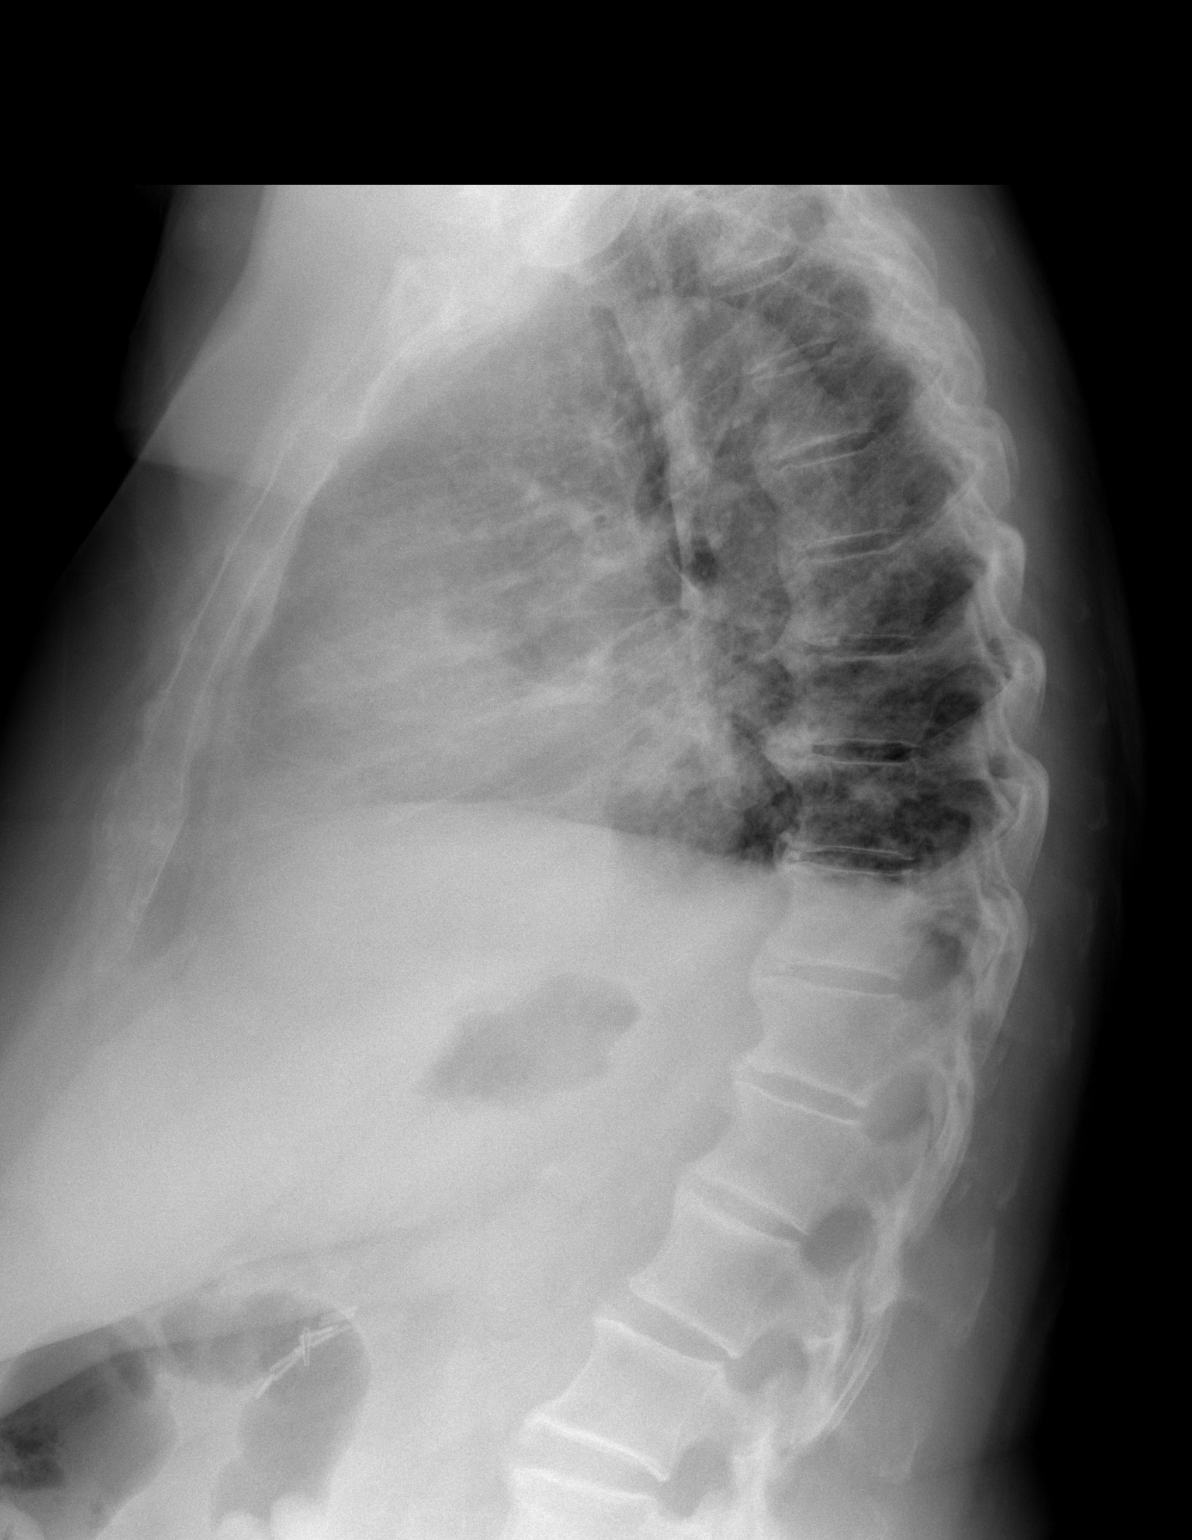

[2 of 2 positions shown; findings below may reference images not displayed]

FINDINGS: Cardiomegaly.  Central pulmonary vascular prominence.
Persistent increased basilar interstitial markings of uncertain
chronicity.  No new segmental infiltrate.  No pneumothorax.
Calcified aorta.
IMPRESSION: No significant change as noted above.

## 2010-11-23 IMAGING — US US SOFT TISSUE HEAD/NECK
1 series · 14 of 25 positions shown · non-contrast
Comparison: Chest CTA dated 03/17/2009.

CLINICAL DATA: Right lobe thyroid mass with substernal extension on
a recent chest CTA.

THYROID ULTRASOUND
TECHNIQUE: Ultrasound examination of the thyroid gland and
adjacent soft tissues was performed.

[Series 1: us soft tissue head/neck · 0.10mm/px · 14 of 39 slices shown]
[im 1/39]
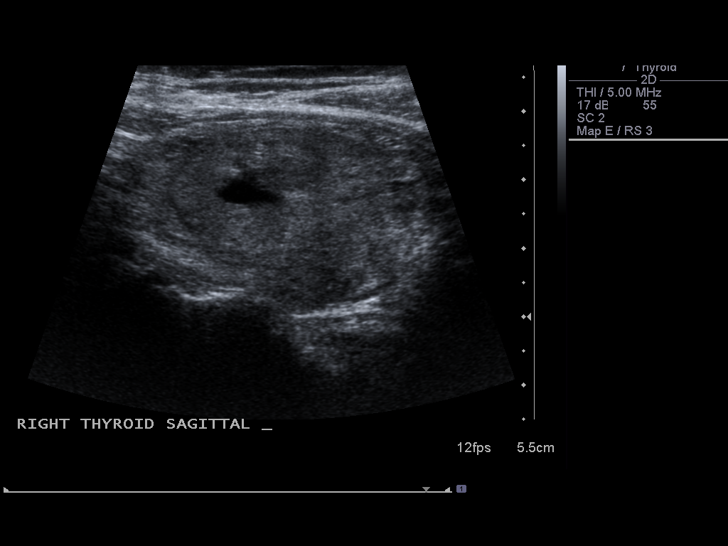
[im 4/39]
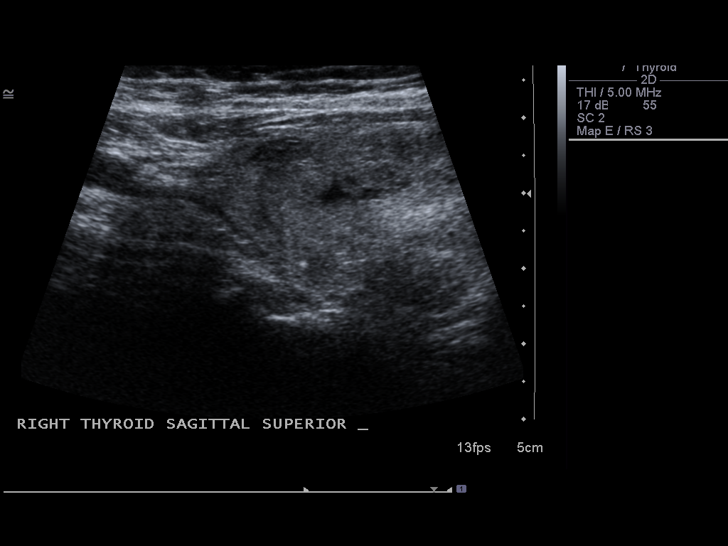
[im 7/39]
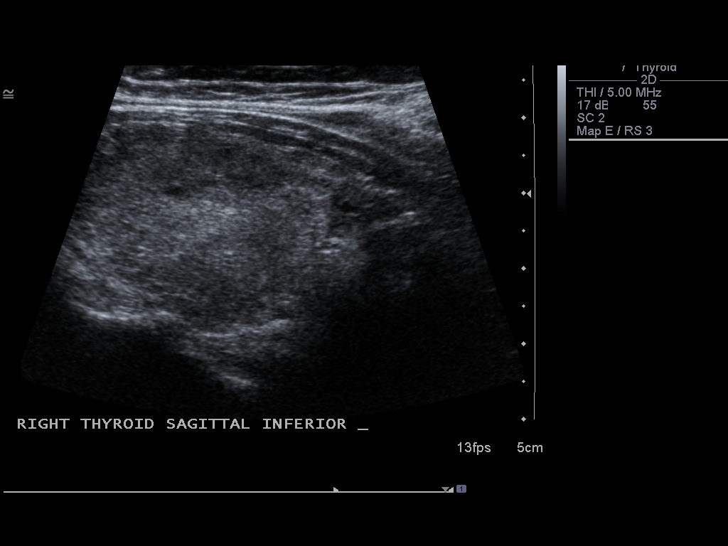
[im 10/39]
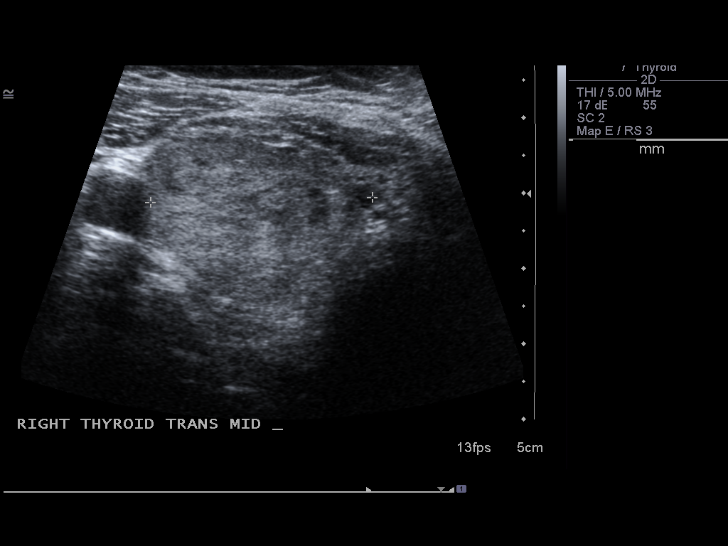
[im 13/39]
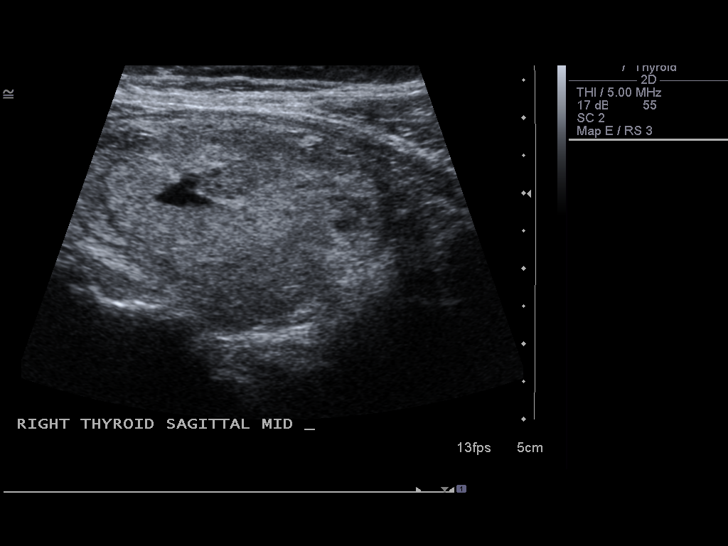
[im 15/39]
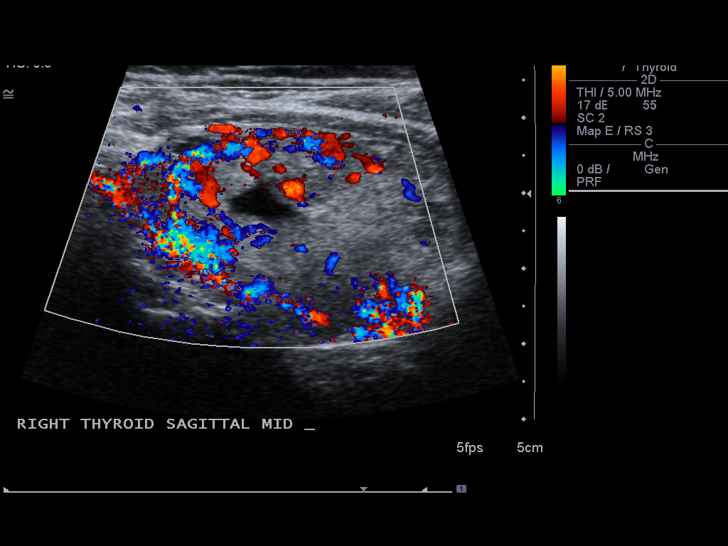
[im 18/39]
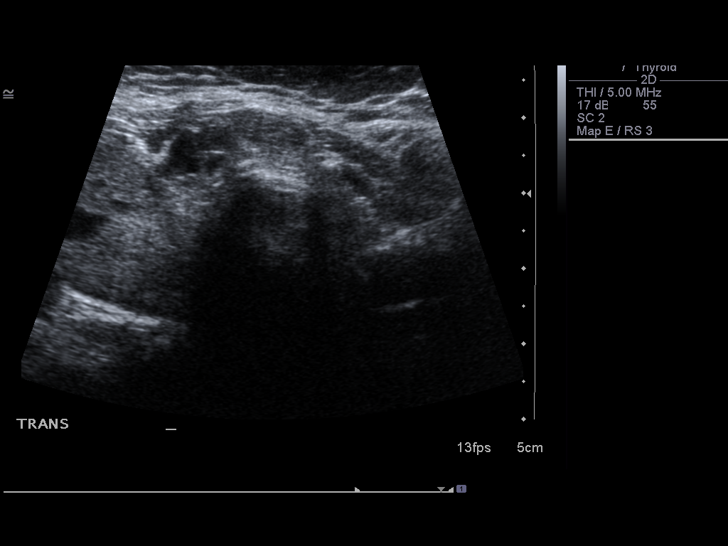
[im 21/39]
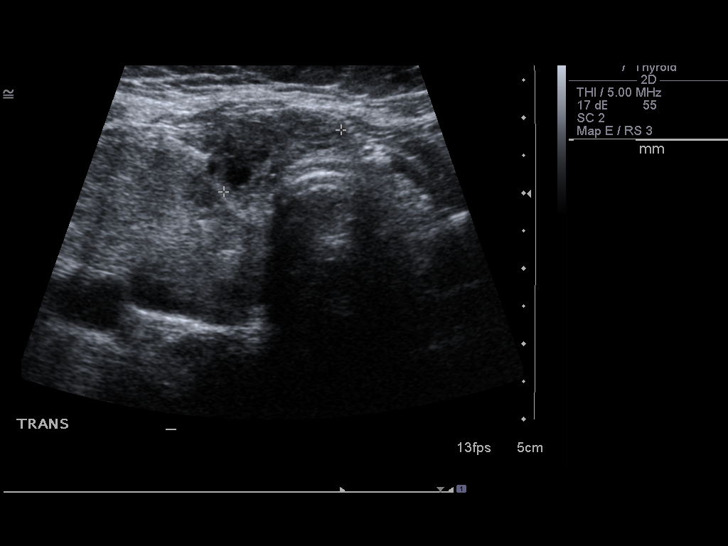
[im 24/39]
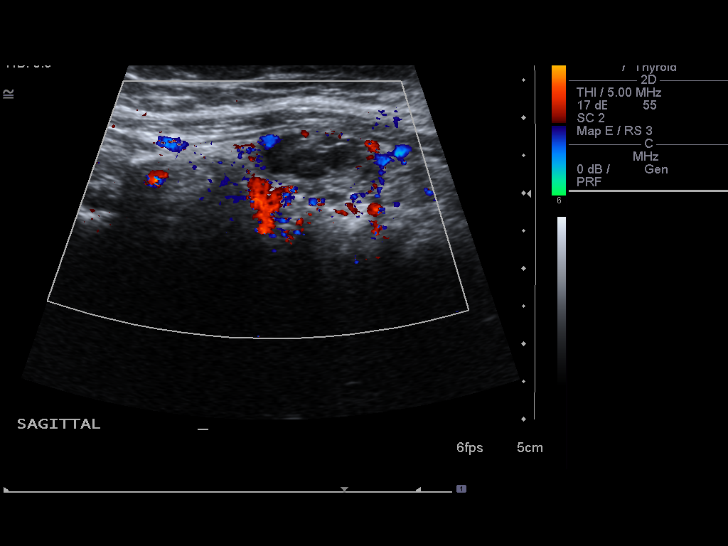
[im 26/39]
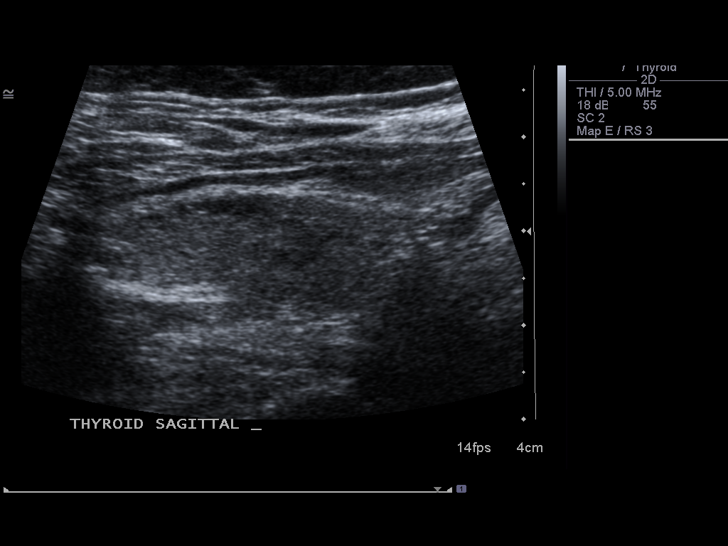
[im 29/39]
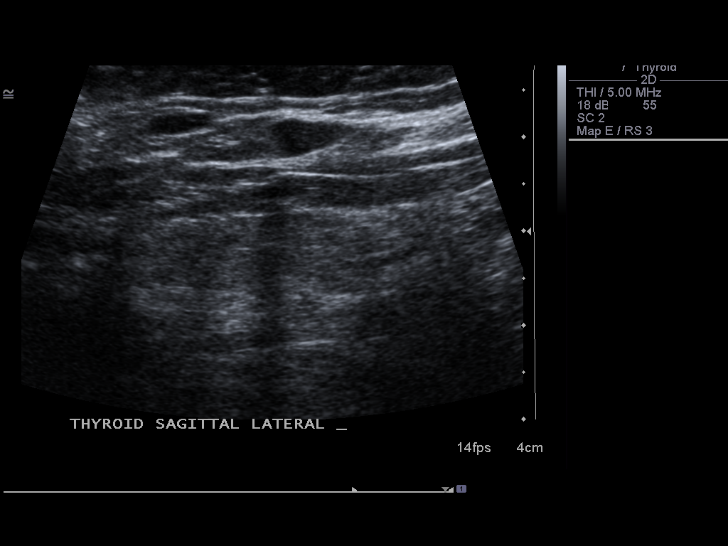
[im 32/39]
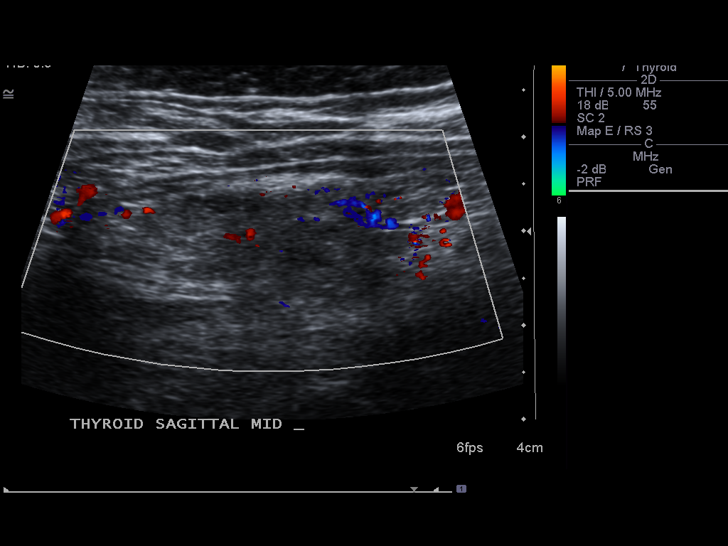
[im 35/39]
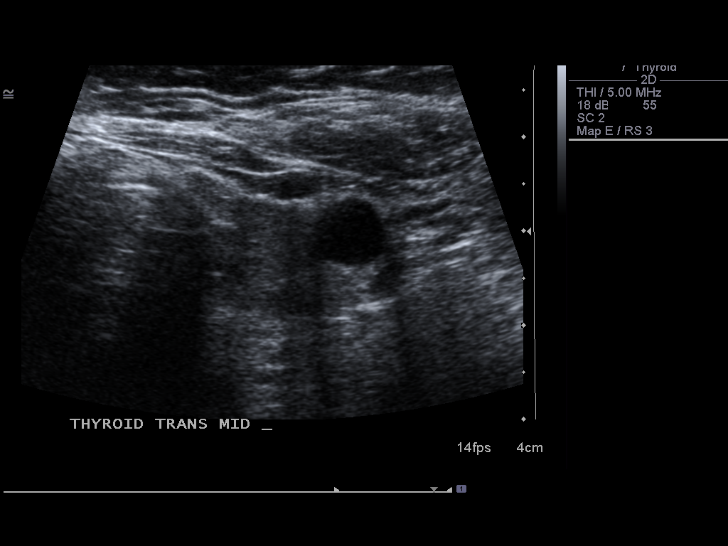
[im 39/39]
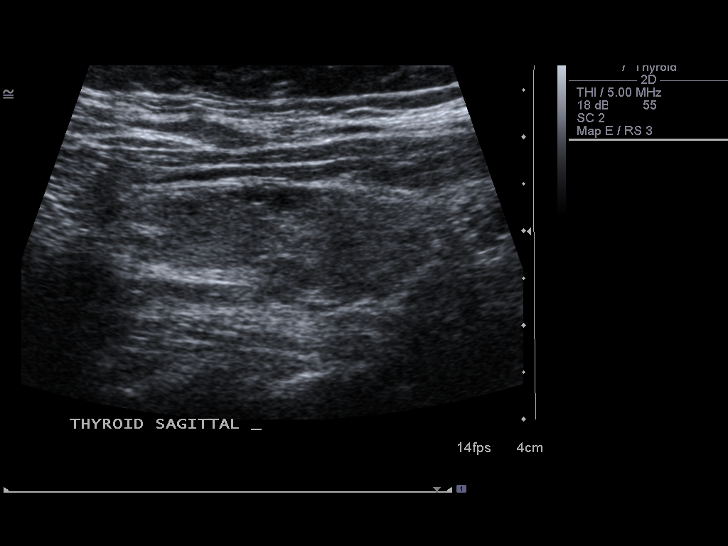

[14 of 25 positions shown; findings below may reference images not displayed]

FINDINGS: The right lobe of the thyroid gland is enlarged compared
to the left lobe with a large mass occupying the majority of the
right lobe.  The mass is predominately solid with small cystic
components.  The mass measures 3.9 x 2.7 x 2.7 cm in maximum
dimensions.  There is also a mixed solid and cystic mass in the
medial aspect of the right lobe, extending into the isthmus.  This
mass measures 1.9 x 1.8 x 0.9 cm in maximum dimensions.  No masses
are seen in the left lobe.

The right lobe measures 5.1 x 2.9 x 2.8 cm in maximum dimensions.
The left lobe measures 3.5 x 1.3 x 0.9 cm in maximum dimensions.
The isthmus measures 7.0 mm in thickness in the midline.
IMPRESSION: Two thyroid nodules on the right.  Fine needle aspiration biopsy of
the larger mass is recommended as well as consideration of fine
needle aspiration biopsy of the smaller mass.

## 2010-11-27 LAB — CBC
HCT: 31.8 — ABNORMAL LOW
Hemoglobin: 11.1 — ABNORMAL LOW
Hemoglobin: 12.8
MCHC: 34.7
MCV: 90.9
Platelets: 189
Platelets: 193
RBC: 3.5 — ABNORMAL LOW
RDW: 13.1
RDW: 13.4
WBC: 6.3
WBC: 9.4

## 2010-11-27 LAB — BASIC METABOLIC PANEL
BUN: 18
Calcium: 9.4
GFR calc Af Amer: >60
GFR calc non Af Amer: >60
GFR calc non Af Amer: >60
Glucose, Bld: 149 — ABNORMAL HIGH
Glucose, Bld: 98
Potassium: 4
Sodium: 136
Sodium: 139

## 2010-11-27 LAB — CK TOTAL AND CKMB (NOT AT ARMC)
CK, MB: 0.6
Relative Index: INVALID
Total CK: 23

## 2010-11-27 LAB — CARDIAC PANEL(CRET KIN+CKTOT+MB+TROPI)
CK, MB: 0.6
Relative Index: INVALID
Total CK: 18

## 2010-11-28 LAB — PROTIME-INR
INR: 1
Prothrombin Time: 12.9

## 2010-12-05 ENCOUNTER — Ambulatory Visit (INDEPENDENT_AMBULATORY_CARE_PROVIDER_SITE_OTHER): Payer: Medicare Other | Admitting: Physician Assistant

## 2010-12-05 ENCOUNTER — Encounter: Payer: Self-pay | Admitting: *Deleted

## 2010-12-05 ENCOUNTER — Encounter: Payer: Self-pay | Admitting: Physician Assistant

## 2010-12-05 VITALS — BP 125/84 | HR 62 | Ht 63.0 in | Wt 189.0 lb

## 2010-12-05 DIAGNOSIS — R609 Edema, unspecified: Secondary | ICD-10-CM

## 2010-12-05 DIAGNOSIS — E785 Hyperlipidemia, unspecified: Secondary | ICD-10-CM

## 2010-12-05 DIAGNOSIS — I495 Sick sinus syndrome: Secondary | ICD-10-CM

## 2010-12-05 DIAGNOSIS — I10 Essential (primary) hypertension: Secondary | ICD-10-CM

## 2010-12-05 DIAGNOSIS — I2510 Atherosclerotic heart disease of native coronary artery without angina pectoris: Secondary | ICD-10-CM

## 2010-12-05 DIAGNOSIS — I4891 Unspecified atrial fibrillation: Secondary | ICD-10-CM

## 2010-12-05 MED ORDER — NITROGLYCERIN 0.4 MG SL SUBL: 0.4000 mg | SUBLINGUAL_TABLET | SUBLINGUAL | Status: DC | PRN

## 2010-12-05 NOTE — Assessment & Plan Note (Signed)
Stable on current regimen   

## 2010-12-05 NOTE — Progress Notes (Signed)
HPI: Patient presents for routine followup.  She denies any interim development of signs or symptoms suggestive of UAP or CHF. She has lost 1 pound since her last visit. She has occasional palpitations, with no significant associated symptoms. She is on Xarelto, started at time of last OV, and denies any evidence of overt bleeding.  She has not had any routine labs since her last OV, and has not had an FLP in several years.  A Lexiscan Cardiolite, 9/10, indicated normal LVF (60%), with normal wall motion, and no evidence of ischemia/infarction.  Allergies  Allergen Reactions  . Codeine     REACTION: vomiting  . Metformin     REACTION: rash    Current Outpatient Prescriptions on File Prior to Visit  Medication Sig Dispense Refill  . aspirin 81 MG tablet Take 81 mg by mouth daily.        Marland Kitchen atenolol (TENORMIN) 50 MG tablet Take 50 mg by mouth every morning.        . bumetanide (BUMEX) 2 MG tablet Take 2 mg by mouth every morning.       . diltiazem (CARDIZEM CD) 360 MG 24 hr capsule Take 360 mg by mouth daily.        Marland Kitchen dofetilide (TIKOSYN) 500 MCG capsule Take 1 capsule (500 mcg total) by mouth 2 (two) times daily.  180 capsule  3  . glipiZIDE (GLUCOTROL) 10 MG tablet Take 10 mg by mouth 2 (two) times daily before a meal.       . nitroGLYCERIN (NITROSTAT) 0.4 MG SL tablet Place 0.4 mg under the tongue every 5 (five) minutes as needed.        . pravastatin (PRAVACHOL) 40 MG tablet Take 1 tablet (40 mg total) by mouth daily.  90 tablet  3  . Rivaroxaban (XARELTO) 20 MG TABS Take 1 tablet by mouth daily.  90 tablet  3  . saxagliptin HCl (ONGLYZA) 5 MG TABS tablet Take 5 mg by mouth daily.          Past Medical History  Diagnosis Date  . Atrial fibrillation   . Atrial flutter   . Coronary artery disease     status post BMS of the left circ in september 2008  . Diabetes mellitus     type II  . Hyperlipidemia   . Multiple thyroid nodules     per patient  . Tachy-brady syndrome     s/p  PPM for tachybrady syndrome and syncope    Past Surgical History  Procedure Date  . Ventral hernia repair   . Rotator cuff repair   . Vesicovaginal fistula closure w/ tah   . Breast lumpectomy   . S/p ppm     for syncope/tachybrady syndrome (MDT) by Fawn Kirk    History   Social History  . Marital Status: Married    Spouse Name: N/A    Number of Children: N/A  . Years of Education: N/A   Occupational History  . Not on file.   Social History Main Topics  . Smoking status: Never Smoker   . Smokeless tobacco: Never Used  . Alcohol Use: No  . Drug Use: Not on file  . Sexually Active: Not on file   Other Topics Concern  . Not on file   Social History Narrative  . No narrative on file    No family history on file.  ROS: Negative for exertional chest pain, DOE, orthopnea, PND, lower extremity edema, presyncope/syncope, claudication, reflux, hematuria, hematochezia,  or melena. Remaining systems reviewed, and are negative.   PHYSICAL EXAM:  BP 125/84  Pulse 62  Ht 5\' 3"  (1.6 m)  Wt 189 lb (85.73 kg)  BMI 33.48 kg/m2  SpO2 95%  GENERAL: 65 year old female, obese; NAD HEENT: NCAT, PERRLA, EOMI; sclera clear; no xanthelasma NECK: palpable bilateral carotid pulses, no bruits; no JVD; no TM LUNGS: CTA bilaterally CARDIAC: RRR (S1, S2); no significant murmurs; no rubs or gallops ABDOMEN: Protuberant EXTREMETIES: 1-2+ bilateral peripheral edema (left greater than right) SKIN: warm/dry; no obvious rash/lesions MUSCULOSKELETAL: no joint deformity NEURO: no focal deficit; NL affect   EKG: see EKG section    ASSESSMENT & PLAN:

## 2010-12-05 NOTE — Assessment & Plan Note (Signed)
Stable on Bumex, followed by Dr. Leandrew Koyanagi.

## 2010-12-05 NOTE — Assessment & Plan Note (Signed)
Maintaining NSR on Tikosyn, initiated at time of last OV. Will check followup CBC and BMET. Patient also on low-dose ASA, given history of CAD.

## 2010-12-05 NOTE — Assessment & Plan Note (Signed)
Status post PPM, followed by Dr. Johney Frame. Resume routine device clinic visits, as previously scheduled.

## 2010-12-05 NOTE — Patient Instructions (Signed)
Your physician recommends that you go to the [facility] for lab work for CBC, CMET, & FLP.  Reminder:  Nothing to eat or drink after 12 midnight prior to labs. If the results of your test are normal or stable, you will receive a letter.  If they are abnormal, the nurse will contact you by phone. Your physician wants you to follow up in: 6 months.  You will receive a reminder letter in the mail one-two months in advance.  If you don't receive a letter, please call our office to schedule the follow up appointment

## 2010-12-05 NOTE — Assessment & Plan Note (Addendum)
Quiesced on current medication regimen. Negative pharmacologic nuclear imaging study in 2010. Renew prescription for NTG. No further workup currently indicated.

## 2010-12-05 NOTE — Assessment & Plan Note (Signed)
Will check FLP/LFT profile, for reassessment of lipid status. Aggressive management recommended with target LDL 70 or less, if feasible. Continue current dose Pravachol, pending further recommendations.

## 2010-12-30 ENCOUNTER — Encounter: Payer: Medicare Other | Admitting: *Deleted

## 2010-12-30 ENCOUNTER — Ambulatory Visit (INDEPENDENT_AMBULATORY_CARE_PROVIDER_SITE_OTHER): Payer: Medicare Other | Admitting: *Deleted

## 2010-12-30 DIAGNOSIS — I4892 Unspecified atrial flutter: Secondary | ICD-10-CM

## 2010-12-30 DIAGNOSIS — I4891 Unspecified atrial fibrillation: Secondary | ICD-10-CM

## 2010-12-30 DIAGNOSIS — I495 Sick sinus syndrome: Secondary | ICD-10-CM

## 2010-12-30 LAB — PACEMAKER DEVICE OBSERVATION
AL AMPLITUDE: 5.6 mv
AL IMPEDENCE PM: 538 Ohm
AL THRESHOLD: 0.75 V
RV LEAD IMPEDENCE PM: 589 Ohm
RV LEAD THRESHOLD: 1 V

## 2010-12-30 NOTE — Progress Notes (Signed)
Pacer check in clinic  

## 2011-02-04 ENCOUNTER — Encounter: Payer: Self-pay | Admitting: Internal Medicine

## 2011-03-01 IMAGING — CR DG CHEST 2V
2 series · 2 of 2 positions shown · non-contrast
Comparison: 03/17/2009.

CLINICAL DATA: Low heart rate.  Pre pacer today.

CHEST - 2 VIEW

[view not recorded (1 of 2)]
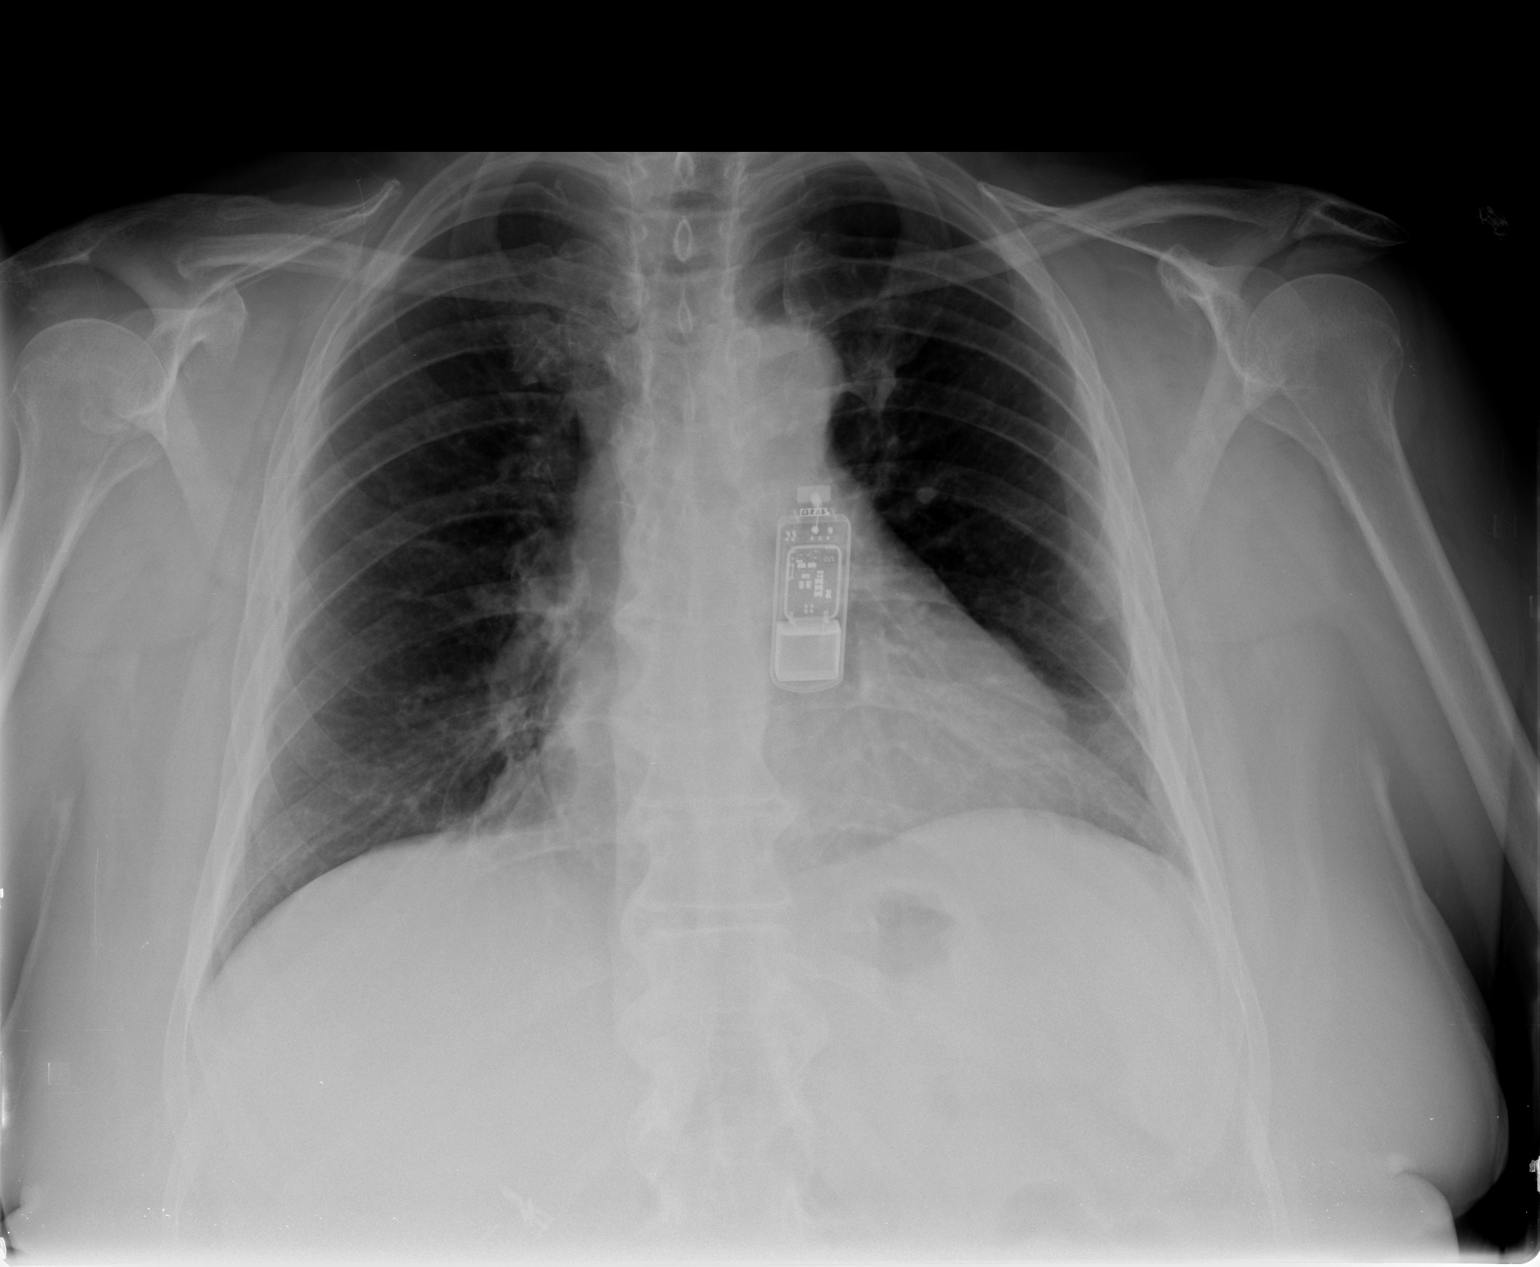

[view not recorded (2 of 2)]
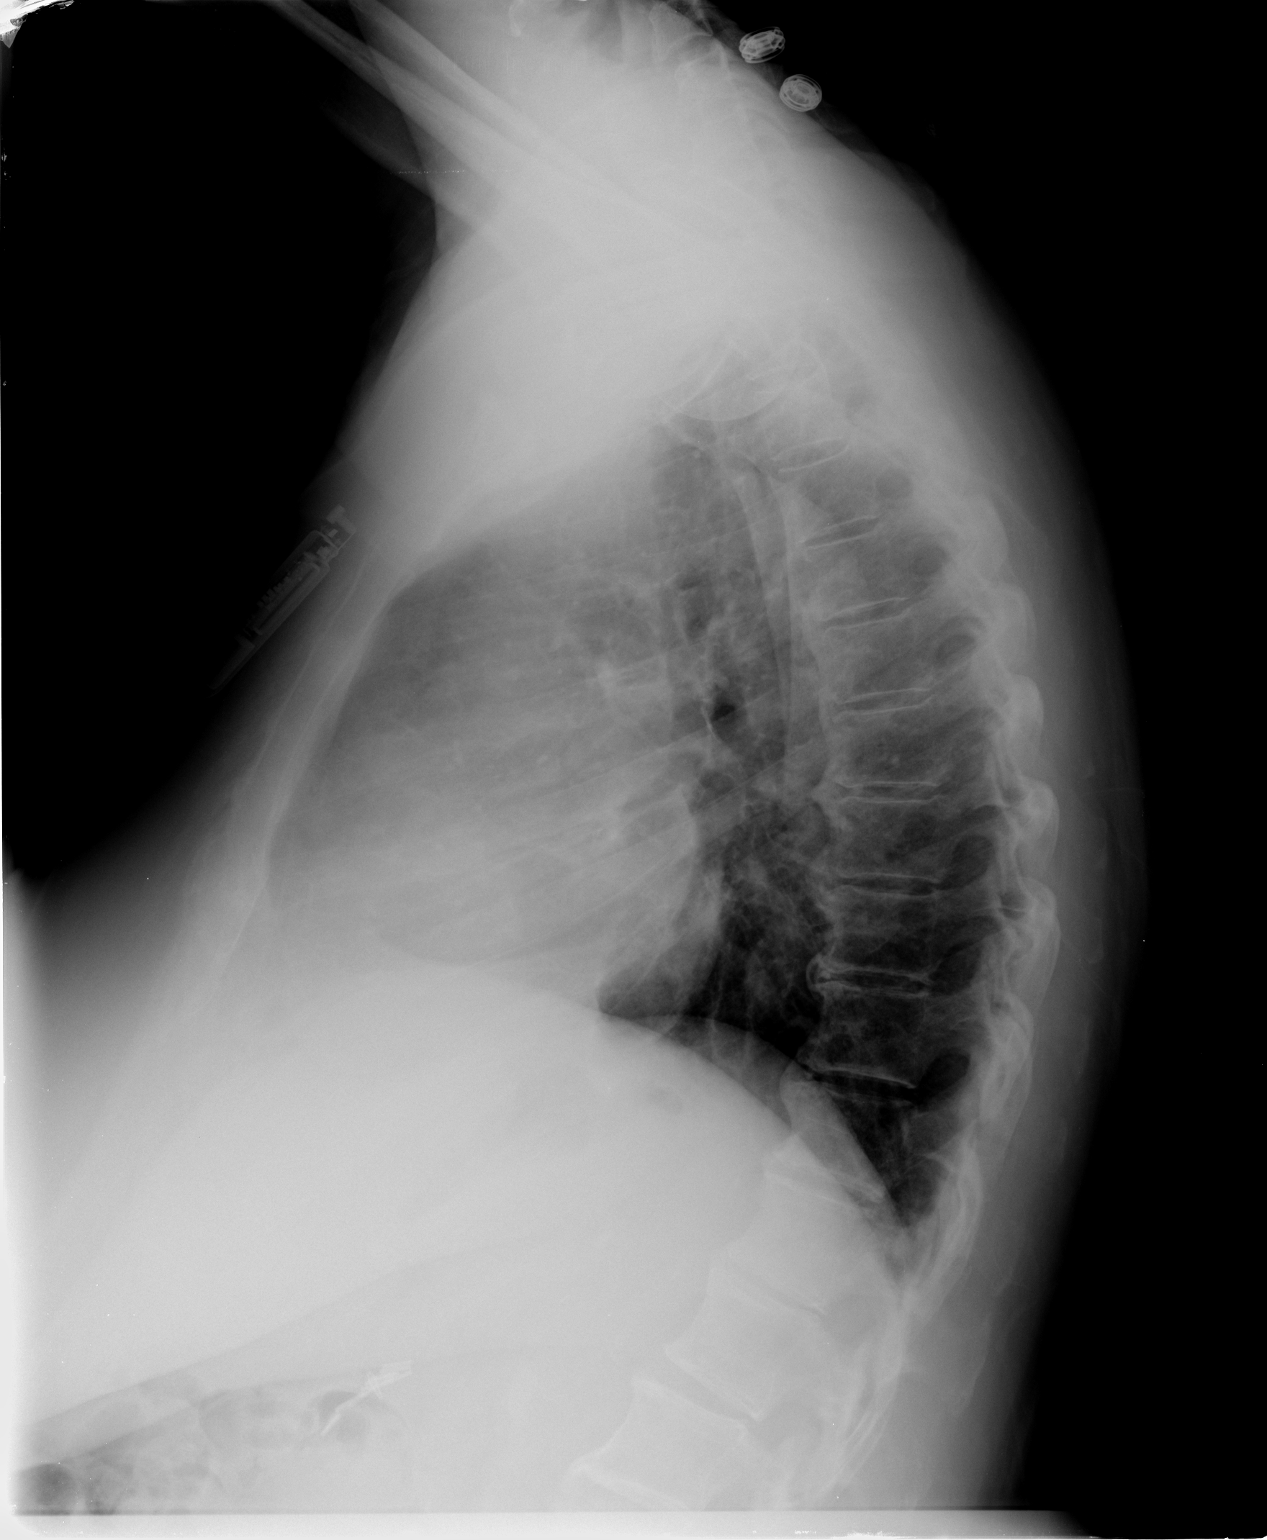

[2 of 2 positions shown; findings below may reference images not displayed]

FINDINGS: Cardiomegaly.  No CHF.  The lungs well expanded and
clear.

Mild to moderate spondylosis of the dorsal spine.
IMPRESSION: Cardiomegaly.  No acute chest findings.

## 2011-04-09 ENCOUNTER — Other Ambulatory Visit: Payer: Self-pay | Admitting: *Deleted

## 2011-04-09 MED ORDER — DILTIAZEM HCL ER COATED BEADS 360 MG PO CP24: 360.0000 mg | ORAL_CAPSULE | Freq: Every day | ORAL | Status: DC

## 2011-04-09 MED ORDER — ATENOLOL 50 MG PO TABS: 50.0000 mg | ORAL_TABLET | ORAL | Status: DC

## 2011-04-09 MED ORDER — RIVAROXABAN 20 MG PO TABS: 1.0000 | ORAL_TABLET | Freq: Every day | ORAL | Status: DC

## 2011-06-09 ENCOUNTER — Other Ambulatory Visit: Payer: Self-pay | Admitting: *Deleted

## 2011-06-09 MED ORDER — PRAVASTATIN SODIUM 40 MG PO TABS: 40.0000 mg | ORAL_TABLET | Freq: Every day | ORAL | Status: DC

## 2011-06-15 ENCOUNTER — Ambulatory Visit: Payer: Medicare Other | Admitting: Cardiology

## 2011-06-29 ENCOUNTER — Encounter: Payer: Self-pay | Admitting: Internal Medicine

## 2011-06-29 ENCOUNTER — Ambulatory Visit (INDEPENDENT_AMBULATORY_CARE_PROVIDER_SITE_OTHER): Payer: Medicare Other | Admitting: Internal Medicine

## 2011-06-29 VITALS — BP 136/42 | Resp 18 | Ht 63.0 in | Wt 190.8 lb

## 2011-06-29 DIAGNOSIS — I495 Sick sinus syndrome: Secondary | ICD-10-CM

## 2011-06-29 DIAGNOSIS — I4891 Unspecified atrial fibrillation: Secondary | ICD-10-CM

## 2011-06-29 LAB — PACEMAKER DEVICE OBSERVATION
AL IMPEDENCE PM: 494 Ohm
AL THRESHOLD: 0.5 V
ATRIAL PACING PM: 80
RV LEAD IMPEDENCE PM: 521 Ohm
RV LEAD THRESHOLD: 0.75 V
VENTRICULAR PACING PM: 2

## 2011-06-29 LAB — BASIC METABOLIC PANEL
CO2: 28 mEq/L (ref 19–32)
Calcium: 8.5 mg/dL (ref 8.4–10.5)
GFR: 60.63 mL/min (ref >60.00)
Glucose, Bld: 250 mg/dL — ABNORMAL HIGH (ref 70–99)
Potassium: 3.5 mEq/L (ref 3.5–5.1)
Sodium: 139 mEq/L (ref 135–145)

## 2011-06-29 LAB — CBC WITH DIFFERENTIAL/PLATELET
Basophils Relative: 0.5 % (ref 0.0–3.0)
Eosinophils Absolute: 0.1 10*3/uL (ref 0.0–0.7)
Eosinophils Relative: 1.7 % (ref 0.0–5.0)
Hemoglobin: 12.4 g/dL (ref 12.0–15.0)
Lymphocytes Relative: 47.1 % — ABNORMAL HIGH (ref 12.0–46.0)
MCHC: 33.9 g/dL (ref 30.0–36.0)
Neutro Abs: 2 10*3/uL (ref 1.4–7.7)
Neutrophils Relative %: 38.5 % — ABNORMAL LOW (ref 43.0–77.0)
RBC: 4.02 Mil/uL (ref 3.87–5.11)
WBC: 5.1 10*3/uL (ref 4.5–10.5)

## 2011-06-29 NOTE — Progress Notes (Signed)
PCP: Juliette Alcide, MD, MD Primary Cardiologist:  Dr Andee Lineman  The patient presents today for routine electrophysiology followup.  Since last being seen in our clinic, the patient reports doing very well.  She remains quite active.  She has occasional dyspnea with moderate activity which is stable.  She feels that her afib is controlled with tikosyn.  She is tolerating Xarelto without difficulty.  Today, she denies symptoms of palpitations, chest pain, shortness of breath, orthopnea, PND, dizziness, presyncope, syncope, or neurologic sequela.  The patient feels that she is tolerating medications without difficulties and is otherwise without complaint today.   Past Medical History  Diagnosis Date  . Atrial fibrillation   . Atrial flutter   . Coronary artery disease     status post BMS of the left circ in september 2008  . Diabetes mellitus     type II  . Hyperlipidemia   . Multiple thyroid nodules     per patient  . 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 Ventral hernia repair   . Rotator cuff repair   . Vesicovaginal fistula closure w/ tah   . Breast lumpectomy   . S/p ppm     for syncope/tachybrady syndrome (MDT) by Fawn Kirk    Current Outpatient Prescriptions  Medication Sig Dispense Refill  . aspirin 81 MG tablet Take 81 mg by mouth daily.        Marland Kitchen atenolol (TENORMIN) 50 MG tablet Take 1 tablet (50 mg total) by mouth every morning.  90 tablet  3  . bumetanide (BUMEX) 2 MG tablet Take 2 mg by mouth every morning.       . diltiazem (CARDIZEM CD) 360 MG 24 hr capsule Take 1 capsule (360 mg total) by mouth daily.  90 capsule  3  . dofetilide (TIKOSYN) 500 MCG capsule Take 1 capsule (500 mcg total) by mouth 2 (two) times daily.  180 capsule  3  . glipiZIDE (GLUCOTROL) 10 MG tablet Take 10 mg by mouth 2 (two) times daily before a meal.       . nitroGLYCERIN (NITROSTAT) 0.4 MG SL tablet Place 1 tablet (0.4 mg total)  under the tongue every 5 (five) minutes as needed.  30 tablet  3  . potassium chloride SA (K-DUR,KLOR-CON) 20 MEQ tablet Take 20 mEq by mouth daily.        . pravastatin (PRAVACHOL) 40 MG tablet Take 1 tablet (40 mg total) by mouth daily.  90 tablet  3  . Rivaroxaban (XARELTO) 20 MG TABS Take 1 tablet by mouth daily.  90 tablet  3  . saxagliptin HCl (ONGLYZA) 5 MG TABS tablet Take 5 mg by mouth daily.          Allergies  Allergen Reactions  . Codeine     REACTION: vomiting  . Metformin     REACTION: rash    History   Social History  . Marital Status: Married    Spouse Name: N/A    Number of Children: N/A  . Years of Education: N/A   Occupational History  . Not on file.   Social History Main Topics  . Smoking status: Never Smoker   . Smokeless tobacco: Never Used  . Alcohol Use: No  . Drug Use: Not on file  . Sexually Active: Not on file   Other Topics Concern  . Not on file   Social History Narrative  .  No narrative on file    Physical Exam: Filed Vitals:   06/29/11 1025  BP: 136/42  Resp: 18  Height: 5\' 3"  (1.6 m)  Weight: 190 lb 12.8 oz (86.546 kg)    GEN- The patient is well appearing, alert and oriented x 3 today.   Head- normocephalic, atraumatic Eyes-  Sclera clear, conjunctiva pink Ears- hearing intact Oropharynx- clear Neck- supple, no JVP Lymph- no cervical lymphadenopathy Lungs- Clear to ausculation bilaterally, normal work of breathing Chest- pacemaker pocket is well healed Heart- Regular rate and rhythm, no murmurs, rubs or gallops, PMI not laterally displaced GI- soft, NT, ND, + BS Extremities- no clubbing, cyanosis, trace edema MS- no significant deformity or atrophy Skin- no rash or lesion Psych- euthymic mood, full affect Neuro- strength and sensation are intact  Pacemaker interrogation- reviewed in detail today,  See PACEART report EKG today reveals a paced rhythm 70 bpm, nonspecifc St/T changes, Qtc 480   Assessment and  Plan:

## 2011-06-29 NOTE — Assessment & Plan Note (Signed)
Normal pacemaker function See Pace Art report No changes today  

## 2011-06-29 NOTE — Assessment & Plan Note (Signed)
Afib burden is 5% Continue xarelto and tikosyn Check BMET, Mg, and CBC today

## 2011-06-29 NOTE — Patient Instructions (Signed)
Your physician wants you to follow-up in: 6 months in the device clinic and 12 months with Dr Johney Frame [name] [name] will receive a reminder letter in the mail two months in advance. If you don't receive a letter, please call our office to schedule the follow-up appointment.  Your physician recommends that you return for lab work in: BMP/CBC

## 2011-07-14 ENCOUNTER — Telehealth: Payer: Self-pay | Admitting: Internal Medicine

## 2011-07-14 NOTE — Telephone Encounter (Signed)
Patient aware of results.

## 2011-07-14 NOTE — Telephone Encounter (Signed)
New Problem:     Patient called in because she wasn't sure about all of the instructions that were left on her voicemail the last time you called.  Please call back, she would really prefer to speak with you in person instead of having a message left.

## 2011-07-17 ENCOUNTER — Ambulatory Visit (INDEPENDENT_AMBULATORY_CARE_PROVIDER_SITE_OTHER): Payer: Medicare Other | Admitting: Cardiology

## 2011-07-17 ENCOUNTER — Encounter: Payer: Self-pay | Admitting: Cardiology

## 2011-07-17 VITALS — BP 121/75 | HR 64 | Ht 63.0 in | Wt 187.0 lb

## 2011-07-17 DIAGNOSIS — I10 Essential (primary) hypertension: Secondary | ICD-10-CM

## 2011-07-17 DIAGNOSIS — Z79899 Other long term (current) drug therapy: Secondary | ICD-10-CM

## 2011-07-17 DIAGNOSIS — I4891 Unspecified atrial fibrillation: Secondary | ICD-10-CM

## 2011-07-17 DIAGNOSIS — Z950 Presence of cardiac pacemaker: Secondary | ICD-10-CM

## 2011-07-17 DIAGNOSIS — Z7901 Long term (current) use of anticoagulants: Secondary | ICD-10-CM

## 2011-07-17 DIAGNOSIS — I2510 Atherosclerotic heart disease of native coronary artery without angina pectoris: Secondary | ICD-10-CM

## 2011-07-17 MED ORDER — MAGNESIUM OXIDE 400 MG PO TABS: 400.0000 mg | ORAL_TABLET | Freq: Every day | ORAL | Status: DC

## 2011-07-17 MED ORDER — POTASSIUM CHLORIDE CRYS ER 20 MEQ PO TBCR: 20.0000 meq | EXTENDED_RELEASE_TABLET | Freq: Every day | ORAL | Status: DC

## 2011-07-17 MED ORDER — DOFETILIDE 500 MCG PO CAPS: 500.0000 ug | ORAL_CAPSULE | Freq: Two times a day (BID) | ORAL | Status: DC

## 2011-07-17 NOTE — Patient Instructions (Addendum)
   Magnesium Oxide 400mg  daily x 2 weeks, then stop   Increase magnesium in diet (see list given) Your physician recommends that you go to the [facility] lab for blood work in 2 weeks for BMET & Magnesium level.  If the results of your test are normal or stable, you will receive a letter.  If they are abnormal, the nurse will contact you by phone. Your physician wants you to follow up in: 6 months.  You will receive a reminder letter in the mail one-two months in advance.  If you don't receive a letter, please call our office to schedule the follow up appointment

## 2011-08-04 ENCOUNTER — Encounter: Payer: Self-pay | Admitting: Cardiology

## 2011-08-08 DIAGNOSIS — Z950 Presence of cardiac pacemaker: Secondary | ICD-10-CM | POA: Insufficient documentation

## 2011-08-08 DIAGNOSIS — Z7901 Long term (current) use of anticoagulants: Secondary | ICD-10-CM | POA: Insufficient documentation

## 2011-08-08 NOTE — Assessment & Plan Note (Signed)
Patient with chronic anticoagulation tolerating Xarelto.

## 2011-08-08 NOTE — Assessment & Plan Note (Signed)
Patient remains in normal sinus rhythm on dofetilide.  She is also followed by Dr. Johney Frame.  EKG shows no significant abnormalities.

## 2011-08-08 NOTE — Assessment & Plan Note (Signed)
Patient reports no recurrent chest pain.  Patient to stop aspirin.  She has a history of prior bare-metal stent.  She has normal LV function.

## 2011-08-08 NOTE — Progress Notes (Signed)
[name] Bottoms, MD, [facility] Board Certified in Adult Cardiovascular Medicine,Internal Medicine and Critical Care Medicine    CC: followup patient with a history of atrial fibrillation  HPI:  Patient who will Dofetelide.  For the most part she is innormal sinus rhythm documented by interrogation of her pacemaker.  The patient has rare palpitations.  The patient denies any chest pain shots of breath orthopnea or PND.  She has presyncope or syncope.  From a cardiovascular standpoint she is stable. She is on Bumex.  Magnesium levels have not been checked.  I've ordered dose and also given patient prescription for magnesium oxide.  PMH: reviewed and listed in Problem List in Electronic Records (and see below) Past Medical History  Diagnosis Date  . Atrial fibrillation   . Atrial flutter   . Coronary artery disease     status post BMS of the left circ in september 2008  . Diabetes mellitus     type II  . Hyperlipidemia   . Multiple thyroid nodules     per patient  . 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 Ventral hernia repair   . Rotator cuff repair   . Vesicovaginal fistula closure w/ tah   . Breast lumpectomy   . S/p ppm     for syncope/tachybrady syndrome (MDT) by Fawn Kirk    Allergies/SH/FHX : available in Electronic Records for review  Allergies  Allergen Reactions  . Codeine     REACTION: vomiting  . Metformin     REACTION: rash   History   Social History  . Marital Status: Married    Spouse Name: N/A    Number of Children: N/A  . Years of Education: N/A   Occupational History  . Not on file.   Social History Main Topics  . Smoking status: Never Smoker   . Smokeless tobacco: Never Used  . Alcohol Use: No  . Drug Use: Not on file  . Sexually Active: Not on file   Other Topics Concern  . Not on file   Social History Narrative  . No narrative on file   No family history on  file.  Medications: Current Outpatient Prescriptions  Medication Sig Dispense Refill  . atenolol (TENORMIN) 50 MG tablet Take 1 tablet (50 mg total) by mouth every morning.  90 tablet  3  . bumetanide (BUMEX) 2 MG tablet Take 2 mg by mouth every morning.       . diltiazem (CARDIZEM CD) 360 MG 24 hr capsule Take 1 capsule (360 mg total) by mouth daily.  90 capsule  3  . dofetilide (TIKOSYN) 500 MCG capsule Take 1 capsule (500 mcg total) by mouth 2 (two) times daily.  180 capsule  3  . glipiZIDE (GLUCOTROL) 10 MG tablet Take 10 mg by mouth 2 (two) times daily before a meal.       . nitroGLYCERIN (NITROSTAT) 0.4 MG SL tablet Place 1 tablet (0.4 mg total) under the tongue every 5 (five) minutes as needed.  30 tablet  3  . potassium chloride SA (K-DUR,KLOR-CON) 20 MEQ tablet Take 1 tablet (20 mEq total) by mouth daily.  90 tablet  3  . pravastatin (PRAVACHOL) 40 MG tablet Take 1 tablet (40 mg total) by mouth daily.  90 tablet  3  . Rivaroxaban (XARELTO) 20 MG TABS Take 1 tablet by mouth daily.  90 tablet  3  .  saxagliptin HCl (ONGLYZA) 5 MG TABS tablet Take 5 mg by mouth daily.        . magnesium oxide (MAG-OX 400) 400 MG tablet Take 1 tablet (400 mg total) by mouth daily.  14 tablet  0    ROS: No nausea or vomiting. No fever or chills.No melena or hematochezia.No bleeding.No claudication  Physical Exam: BP 121/75  Pulse 64  Ht 5\' 3"  (1.6 m)  Wt 187 lb (84.823 kg)  BMI 33.13 kg/m2 General:well-nourished white female in no distress. Neck:normal carotid upstrokes no carotid bruits.  No thyromegaly non-nodular thyroid.  JVP 6 cm Lungs:clear breath sounds bilaterally without wheezing. Cardiac:regular rate and rhythm with normal S1 and S2 Vascular:no edema. Skin:warm and dry Physcologic:normal affect  12lead AVW:[id number] sinus rhythm Limited bedside ECHO:N/A No images are attached to the encounter.   Assessment and Plan  ATRIAL FIBRILLATION Patient remains in normal sinus rhythm on  dofetilide.  She is also followed by Dr. Johney Frame.  EKG shows no significant abnormalities.  Coronary atherosclerosis of unspecified type of vessel, native or graft Patient reports no recurrent chest pain.  Patient to stop aspirin.  She has a history of prior bare-metal stent.  She has normal LV function.  HYPERTENSION, UNSPECIFIED Blood pressure well-controlled.  Chronic anticoagulation Patient with chronic anticoagulation tolerating Xarelto.  Pacemaker Patient plus 95% of the time in normal sinus rhythm.  Pacemaker interrogation by Dr. Johney Frame.  Patient has rare palpitations.  Otherwise asymptomatic   Patient Active Problem List  Diagnosis  . THYROID MASS  . DIABETES MELLITUS, TYPE II  . HYPERLIPIDEMIA-MIXED  . HYPERTENSION, UNSPECIFIED  . Coronary atherosclerosis of unspecified type of vessel, native or graft  . ATRIAL FIBRILLATION  . ATRIAL FLUTTER  . BRADYCARDIA-TACHYCARDIA SYNDROME  . SYNCOPE  . MASS, SUPERFICIAL  . PERIPHERAL EDEMA  . Nonspecific (abnormal) findings on radiological and other examination of body structure  . CHEST XRAY, ABNORMAL  . Chronic anticoagulation  . Pacemaker

## 2011-08-08 NOTE — Assessment & Plan Note (Signed)
Patient plus 95% of the time in normal sinus rhythm.  Pacemaker interrogation by Dr. Johney Frame.  Patient has rare palpitations.  Otherwise asymptomatic

## 2011-08-08 NOTE — Assessment & Plan Note (Signed)
Blood pressure well controlled

## 2011-08-17 ENCOUNTER — Ambulatory Visit: Payer: Medicare Other | Admitting: Cardiology

## 2011-08-17 ENCOUNTER — Telehealth: Payer: Self-pay | Admitting: *Deleted

## 2011-08-17 NOTE — Telephone Encounter (Signed)
Notes Recorded by Lesle Chris, LPN on 06/20/979 at 12:20 PM Patient notified and verbalized understanding.

## 2011-08-17 NOTE — Telephone Encounter (Signed)
Message copied by Lesle Chris on Mon Aug 17, 2011 12:20 PM ------      Message from: [name]      Created: Sat Aug 08, 2011 12:47 PM       BMET NL NTD

## 2011-12-23 ENCOUNTER — Encounter: Payer: Self-pay | Admitting: *Deleted

## 2011-12-28 ENCOUNTER — Ambulatory Visit (INDEPENDENT_AMBULATORY_CARE_PROVIDER_SITE_OTHER): Payer: Medicare Other | Admitting: *Deleted

## 2011-12-28 DIAGNOSIS — I495 Sick sinus syndrome: Secondary | ICD-10-CM

## 2011-12-28 DIAGNOSIS — I4891 Unspecified atrial fibrillation: Secondary | ICD-10-CM

## 2011-12-28 LAB — PACEMAKER DEVICE OBSERVATION
ATRIAL PACING PM: 80
BAMS-0001: 175 {beats}/min
BATTERY VOLTAGE: 2.79 V
RV LEAD THRESHOLD: 1 V
VENTRICULAR PACING PM: 3

## 2011-12-28 NOTE — Progress Notes (Signed)
PPM check 

## 2012-01-18 ENCOUNTER — Encounter: Payer: Self-pay | Admitting: Internal Medicine

## 2012-01-26 ENCOUNTER — Other Ambulatory Visit: Payer: Self-pay | Admitting: *Deleted

## 2012-01-26 DIAGNOSIS — I2510 Atherosclerotic heart disease of native coronary artery without angina pectoris: Secondary | ICD-10-CM

## 2012-01-26 MED ORDER — NITROGLYCERIN 0.4 MG SL SUBL: 0.4000 mg | SUBLINGUAL_TABLET | SUBLINGUAL | Status: DC | PRN

## 2012-02-17 HISTORY — PX: COLONOSCOPY: SHX174

## 2012-02-22 ENCOUNTER — Other Ambulatory Visit: Payer: Self-pay | Admitting: *Deleted

## 2012-02-22 MED ORDER — PRAVASTATIN SODIUM 40 MG PO TABS: 40.0000 mg | ORAL_TABLET | Freq: Every evening | ORAL | Status: DC

## 2012-02-22 MED ORDER — POTASSIUM CHLORIDE CRYS ER 20 MEQ PO TBCR: 20.0000 meq | EXTENDED_RELEASE_TABLET | Freq: Every day | ORAL | Status: DC

## 2012-02-22 MED ORDER — DOFETILIDE 500 MCG PO CAPS: 500.0000 ug | ORAL_CAPSULE | Freq: Two times a day (BID) | ORAL | Status: DC

## 2012-02-22 MED ORDER — ATENOLOL 50 MG PO TABS: 50.0000 mg | ORAL_TABLET | ORAL | Status: DC

## 2012-02-22 MED ORDER — DILTIAZEM HCL ER COATED BEADS 360 MG PO CP24: 360.0000 mg | ORAL_CAPSULE | Freq: Every day | ORAL | Status: DC

## 2012-02-22 MED ORDER — RIVAROXABAN 20 MG PO TABS: 20.0000 mg | ORAL_TABLET | Freq: Every day | ORAL | Status: DC

## 2012-03-07 ENCOUNTER — Encounter: Payer: Self-pay | Admitting: Physician Assistant

## 2012-03-07 ENCOUNTER — Ambulatory Visit (INDEPENDENT_AMBULATORY_CARE_PROVIDER_SITE_OTHER): Payer: Medicare Other | Admitting: Physician Assistant

## 2012-03-07 VITALS — BP 107/70 | HR 72 | Ht 63.0 in | Wt 193.0 lb

## 2012-03-07 DIAGNOSIS — E119 Type 2 diabetes mellitus without complications: Secondary | ICD-10-CM

## 2012-03-07 DIAGNOSIS — I495 Sick sinus syndrome: Secondary | ICD-10-CM

## 2012-03-07 DIAGNOSIS — I2510 Atherosclerotic heart disease of native coronary artery without angina pectoris: Secondary | ICD-10-CM | POA: Insufficient documentation

## 2012-03-07 DIAGNOSIS — Z79899 Other long term (current) drug therapy: Secondary | ICD-10-CM

## 2012-03-07 DIAGNOSIS — E785 Hyperlipidemia, unspecified: Secondary | ICD-10-CM

## 2012-03-07 DIAGNOSIS — R609 Edema, unspecified: Secondary | ICD-10-CM

## 2012-03-07 DIAGNOSIS — I4891 Unspecified atrial fibrillation: Secondary | ICD-10-CM

## 2012-03-07 NOTE — Patient Instructions (Signed)
   Stop Aspirin  Labs today for BMET, Magnesium  Office will contact with results  Decrease Tenormin to 25mg  twice a day  X 1 week, then 12.5mg  twice a day  X 1 week, then STOP  Check blood pressure & heart rate one week after stopping medication above & call to office Your physician wants you to follow up in: 6 months.  You will receive a reminder letter in the mail one-two months in advance.  If you don't receive a letter, please call our office to schedule the follow up appointment

## 2012-03-07 NOTE — Assessment & Plan Note (Signed)
Followed by primary M.D. Recommend aggressive management with target LDL 70 or less, if feasible. 

## 2012-03-07 NOTE — Assessment & Plan Note (Signed)
Quiescent on current medication regimen. 

## 2012-03-07 NOTE — Assessment & Plan Note (Signed)
Patient is on a diuretic, followed by primary M.D. Will check electrolytes/magnesium level and, if the latter is NL, discontinue supplemental magnesium.

## 2012-03-07 NOTE — Assessment & Plan Note (Signed)
Followed by primary M.D. 

## 2012-03-07 NOTE — Progress Notes (Signed)
Primary Cardiologist: Simona Huh, MD (new)   HPI: Patient presents for routine followup, last seen here in clinic 06/2011, by Dr. Andee Lineman.  She presents with no interim development of symptoms suggestive of exertional CP or CHF. She denies any significant palpitations. She denies presyncope/syncope. She reports compliance with her medications.   12-lead EKG today, reviewed by me, indicates NSR 72 bpm; LAD; NSST changes  Allergies  Allergen Reactions  . Codeine     REACTION: vomiting  . Lisinopril     Cough   . Metformin     REACTION: rash    Current Outpatient Prescriptions  Medication Sig Dispense Refill  . bumetanide (BUMEX) 2 MG tablet Take 2 mg by mouth every morning.       . diltiazem (CARDIZEM CD) 360 MG 24 hr capsule Take 1 capsule (360 mg total) by mouth daily.  90 capsule  3  . dofetilide (TIKOSYN) 500 MCG capsule Take 1 capsule (500 mcg total) by mouth 2 (two) times daily.  180 capsule  3  . glipiZIDE (GLUCOTROL) 10 MG tablet Take 10 mg by mouth 2 (two) times daily before a meal.       . magnesium oxide (MAG-OX 400) 400 MG tablet Take 1 tablet (400 mg total) by mouth daily.  14 tablet  0  . nitroGLYCERIN (NITROSTAT) 0.4 MG SL tablet Place 1 tablet (0.4 mg total) under the tongue every 5 (five) minutes as needed.  25 tablet  3  . potassium chloride SA (K-DUR,KLOR-CON) 20 MEQ tablet Take 1 tablet (20 mEq total) by mouth daily.  90 tablet  3  . pravastatin (PRAVACHOL) 40 MG tablet Take 1 tablet (40 mg total) by mouth every evening.  90 tablet  3  . Rivaroxaban (XARELTO) 20 MG TABS Take 1 tablet (20 mg total) by mouth daily.  90 tablet  3  . saxagliptin HCl (ONGLYZA) 5 MG TABS tablet Take 5 mg by mouth daily.          Past Medical History  Diagnosis Date  . Atrial fibrillation   . Atrial flutter   . Coronary artery disease     status post BMS of the left circ in september 2008  . Diabetes mellitus     type II  . Hyperlipidemia   . Multiple thyroid nodules     per  patient  . Tachy-brady syndrome     s/p PPM for tachybrady syndrome and syncope    Past Surgical History  Procedure Date  . Ventral hernia repair   . Rotator cuff repair   . Vesicovaginal fistula closure w/ tah   . Breast lumpectomy   . S/p ppm     for syncope/tachybrady syndrome (MDT) by Fawn Kirk    History   Social History  . Marital Status: Married    Spouse Name: N/A    Number of Children: N/A  . Years of Education: N/A   Occupational History  . Not on file.   Social History Main Topics  . Smoking status: Never Smoker   . Smokeless tobacco: Never Used  . Alcohol Use: No  . Drug Use: Not on file  . Sexually Active: Not on file   Other Topics Concern  . Not on file   Social History Narrative  . No narrative on file    No family history on file.  ROS: no nausea, vomiting; no fever, chills; no melena, hematochezia; no claudication  PHYSICAL EXAM: BP 107/70  Pulse 72  Ht 5\' 3"  (1.6  m)  Wt 193 lb (87.544 kg)  BMI 34.19 kg/m2 GENERAL: 67 year old female, moderately obese; NAD HEENT: NCAT, PERRLA, EOMI; sclera clear; no xanthelasma NECK: palpable bilateral carotid pulses, no bruits; no JVD; no TM LUNGS: CTA bilaterally CARDIAC: RRR (S1, S2); no significant murmurs; no rubs or gallops ABDOMEN: Protuberant EXTREMETIES: no significant peripheral edema SKIN: warm/dry; no obvious rash/lesions MUSCULOSKELETAL: no joint deformity NEURO: no focal deficit; NL affect   EKG: reviewed and available in Electronic Records   ASSESSMENT & PLAN:  ATRIAL FIBRILLATION Maintaining NSR on Tikosyn. Will  discontinue low-dose ASA, given that she is on Xarelto. Also, she is on 2 AV nodal blocking agents, atenolol and high dose Cardizem. She has history of normal LVF, most recently assessed by echocardiogram 11/2009. Therefore, I will taper her off the beta blocker over the next few weeks, and continue current dose diltiazem. I will defer to Dr. Johney Frame for further recommendations  regarding whether or not to continue this medication, given that she has been maintaining NSR on Tikosyn.   BRADYCARDIA-TACHYCARDIA SYNDROME Followed by Dr. Johney Frame  CAD (coronary artery disease) Quiescent on current medication regimen.  DIABETES MELLITUS, TYPE II Followed by primary M.D.  HYPERLIPIDEMIA-MIXED Followed by primary M.D. Recommend aggressive management with target LDL 70 or less, if feasible.  PERIPHERAL EDEMA Patient is on a diuretic, followed by primary M.D. Will check electrolytes/magnesium level and, if the latter is NL, discontinue supplemental magnesium.    Gene Malvern Kadlec, PAC

## 2012-03-07 NOTE — Assessment & Plan Note (Signed)
Maintaining NSR on Tikosyn. Will  discontinue low-dose ASA, given that she is on Xarelto. Also, she is on 2 AV nodal blocking agents, atenolol and high dose Cardizem. She has history of normal LVF, most recently assessed by echocardiogram 11/2009. Therefore, I will taper her off the beta blocker over the next few weeks, and continue current dose diltiazem. I will defer to Dr. Johney Frame for further recommendations regarding whether or not to continue this medication, given that she has been maintaining NSR on Tikosyn.

## 2012-03-07 NOTE — Assessment & Plan Note (Signed)
Followed by Dr. Allred. 

## 2012-03-09 ENCOUNTER — Telehealth: Payer: Self-pay | Admitting: *Deleted

## 2012-03-09 NOTE — Telephone Encounter (Signed)
Notes Recorded by Lesle Chris, LPN on 05/25/8117 at 11:23 AM Patient notified.

## 2012-03-09 NOTE — Telephone Encounter (Signed)
Message copied by Lesle Chris on Wed Mar 09, 2012 11:23 AM ------      Message from: [name] [name]      Created: Wed Mar 09, 2012  8:12 AM       Labs ok. D/[name] MgOxide

## 2012-06-29 ENCOUNTER — Encounter: Payer: Self-pay | Admitting: Internal Medicine

## 2012-06-29 ENCOUNTER — Ambulatory Visit (INDEPENDENT_AMBULATORY_CARE_PROVIDER_SITE_OTHER): Payer: Medicare Other | Admitting: Internal Medicine

## 2012-06-29 VITALS — BP 114/76 | HR 74 | Ht 63.0 in | Wt 192.0 lb

## 2012-06-29 DIAGNOSIS — I4891 Unspecified atrial fibrillation: Secondary | ICD-10-CM

## 2012-06-29 DIAGNOSIS — I495 Sick sinus syndrome: Secondary | ICD-10-CM

## 2012-06-29 DIAGNOSIS — I4892 Unspecified atrial flutter: Secondary | ICD-10-CM

## 2012-06-29 LAB — PACEMAKER DEVICE OBSERVATION
AL IMPEDENCE PM: 506 Ohm
ATRIAL PACING PM: 60
RV LEAD IMPEDENCE PM: 591 Ohm
RV LEAD THRESHOLD: 1 V
VENTRICULAR PACING PM: 2

## 2012-06-29 NOTE — Patient Instructions (Signed)
Continue all current medications. Kristin - 6 months Allred - 1 year

## 2012-06-29 NOTE — Progress Notes (Signed)
PCP: Juliette Alcide, MD  The patient presents today for routine electrophysiology followup.  Since last being seen in our clinic, the patient reports doing very well.  She remains quite active.  She feels that her afib is controlled with tikosyn.  She is tolerating Xarelto without difficulty.  Today, she denies symptoms of palpitations, chest pain, shortness of breath, orthopnea, PND, dizziness, presyncope, syncope, or neurologic sequela.  The patient feels that she is tolerating medications without difficulties and is otherwise without complaint today.   Past Medical History  Diagnosis Date  . Atrial fibrillation   . Atrial flutter   . Coronary artery disease     status post BMS of the left circ in september 2008  . Diabetes mellitus     type II  . Hyperlipidemia   . Multiple thyroid nodules     per patient  . Tachy-brady syndrome     s/p PPM for tachybrady syndrome and syncope   Past Surgical History  Procedure Laterality Date  . Ventral hernia repair    . Rotator cuff repair    . Vesicovaginal fistula closure w/ tah    . Breast lumpectomy    . S/p ppm      for syncope/tachybrady syndrome (MDT) by Fawn Kirk    Current Outpatient Prescriptions  Medication Sig Dispense Refill  . bumetanide (BUMEX) 2 MG tablet Take 2 mg by mouth every morning.       . diltiazem (CARDIZEM CD) 360 MG 24 hr capsule Take 1 capsule (360 mg total) by mouth daily.  90 capsule  3  . dofetilide (TIKOSYN) 500 MCG capsule Take 1 capsule (500 mcg total) by mouth 2 (two) times daily.  180 capsule  3  . glipiZIDE (GLUCOTROL) 10 MG tablet Take 10 mg by mouth 2 (two) times daily before a meal.       . nitroGLYCERIN (NITROSTAT) 0.4 MG SL tablet Place 1 tablet (0.4 mg total) under the tongue every 5 (five) minutes as needed.  25 tablet  3  . potassium chloride SA (K-DUR,KLOR-CON) 20 MEQ tablet Take 1 tablet (20 mEq total) by mouth daily.  90 tablet  3  . pravastatin (PRAVACHOL) 40 MG tablet Take 1 tablet (40 mg total) by  mouth every evening.  90 tablet  3  . Rivaroxaban (XARELTO) 20 MG TABS Take 1 tablet (20 mg total) by mouth daily.  90 tablet  3  . saxagliptin HCl (ONGLYZA) 5 MG TABS tablet Take 5 mg by mouth daily.        Marland Kitchen VICTOZA 18 MG/3ML SOPN Inject 1.2 mLs into the skin daily.       No current facility-administered medications for this visit.    Allergies  Allergen Reactions  . Codeine     REACTION: vomiting  . Lisinopril     Cough   . Metformin     REACTION: rash    History   Social History  . Marital Status: Married    Spouse Name: N/A    Number of Children: N/A  . Years of Education: N/A   Occupational History  . Not on file.   Social History Main Topics  . Smoking status: Never Smoker   . Smokeless tobacco: Never Used  . Alcohol Use: No  . Drug Use: Not on file  . Sexually Active: Not on file   Other Topics Concern  . Not on file   Social History Narrative  . No narrative on file    Physical Exam: Ceasar Mons  Vitals:   06/29/12 0825  BP: 114/76  Pulse: 74  Height: 5\' 3"  (1.6 m)  Weight: 192 lb (87.091 kg)    GEN- The patient is well appearing, alert and oriented x 3 today.   Head- normocephalic, atraumatic Eyes-  Sclera clear, conjunctiva pink Ears- hearing intact Oropharynx- clear Neck- supple, no JVP Lymph- no cervical lymphadenopathy Lungs- Clear to ausculation bilaterally, normal work of breathing Chest- pacemaker pocket is well healed Heart- Regular rate and rhythm, no murmurs, rubs or gallops, PMI not laterally displaced GI- soft, NT, ND, + BS Extremities- no clubbing, cyanosis, trace edema Neuro- strength and sensation are intact  Pacemaker interrogation- reviewed in detail today,  See PACEART report  EKG from 1/14 and labs are reviewed   Assessment and Plan:  1. afib Stable  (afib burden 7% by PPM interrogation) Rate control during afib is marginal.  May need to restart beta blocker in addition to CCB if rates continue to be an issues (beta  blocker stopped last visit by Gene Serpe) No change required today  2. Tachy/brady Normal pacemaker function See Arita Miss Art report No changes today  Return to the device clinic in 6 months I will see again in a year

## 2012-09-09 ENCOUNTER — Encounter: Payer: Self-pay | Admitting: Cardiology

## 2012-09-09 ENCOUNTER — Ambulatory Visit (INDEPENDENT_AMBULATORY_CARE_PROVIDER_SITE_OTHER): Payer: Medicare Other | Admitting: Cardiology

## 2012-09-09 VITALS — BP 147/80 | HR 71 | Ht 63.0 in | Wt 191.8 lb

## 2012-09-09 DIAGNOSIS — Z950 Presence of cardiac pacemaker: Secondary | ICD-10-CM

## 2012-09-09 DIAGNOSIS — I2510 Atherosclerotic heart disease of native coronary artery without angina pectoris: Secondary | ICD-10-CM

## 2012-09-09 DIAGNOSIS — E785 Hyperlipidemia, unspecified: Secondary | ICD-10-CM

## 2012-09-09 DIAGNOSIS — I4891 Unspecified atrial fibrillation: Secondary | ICD-10-CM

## 2012-09-09 NOTE — Progress Notes (Signed)
Clinical Summary Ms. Knarr is a 67 y.o.female presenting for office followup. She is a former patient of Dr. Andee Lineman, last seen in the office by Mr. Serpe PA-C in January. She is also followed by Dr. Johney Frame.  She reports only occasional, brief sense of palpitations. No chest pain. She indicates that she did have some bleeding in her stools and underwent colonoscopy with Dr. Teena Dunk off Xarelto, had several polyps removed, also hemorrhoids which were likely the culprit based on her recollection. She is no longer having any bleeding problems and is his back on Xarelto.  Also complains of leg edema when she is up on her feet most of the day. Sometimes a cough at nighttime. No recent assessment of LV or RV function.   Allergies  Allergen Reactions  . Codeine     REACTION: vomiting  . Lisinopril     Cough   . Metformin     REACTION: rash    Current Outpatient Prescriptions  Medication Sig Dispense Refill  . bumetanide (BUMEX) 2 MG tablet Take 4 mg by mouth every morning.       . diltiazem (CARDIZEM CD) 360 MG 24 hr capsule Take 1 capsule (360 mg total) by mouth daily.  90 capsule  3  . dofetilide (TIKOSYN) 500 MCG capsule Take 1 capsule (500 mcg total) by mouth 2 (two) times daily.  180 capsule  3  . glipiZIDE (GLUCOTROL) 10 MG tablet Take 10 mg by mouth 2 (two) times daily before a meal.       . nitroGLYCERIN (NITROSTAT) 0.4 MG SL tablet Place 1 tablet (0.4 mg total) under the tongue every 5 (five) minutes as needed.  25 tablet  3  . potassium chloride SA (K-DUR,KLOR-CON) 20 MEQ tablet Take 1 tablet (20 mEq total) by mouth daily.  90 tablet  3  . pravastatin (PRAVACHOL) 40 MG tablet Take 1 tablet (40 mg total) by mouth every evening.  90 tablet  3  . Rivaroxaban (XARELTO) 20 MG TABS Take 1 tablet (20 mg total) by mouth daily.  90 tablet  3  . saxagliptin HCl (ONGLYZA) 5 MG TABS tablet Take 5 mg by mouth daily.        Marland Kitchen VICTOZA 18 MG/3ML SOPN Inject 1.2 mLs into the skin daily.       No  current facility-administered medications for this visit.    Past Medical History  Diagnosis Date  . Atrial fibrillation   . Atrial flutter   . Coronary atherosclerosis of native coronary artery     BMS to circumflex 9/08  . Type 2 diabetes mellitus   . Hyperlipidemia   . Multiple thyroid nodules   . Tachy-brady syndrome     PPM - Medtronic    Social History Ms. Gunnerson reports that she has never smoked. She has never used smokeless tobacco. Ms. Long reports that she does not drink alcohol.  Review of Systems Negative except as outlined.  Physical Examination Filed Vitals:   09/09/12 0831  BP: 147/80  Pulse: 71   Filed Weights   09/09/12 0831  Weight: 191 lb 12.8 oz (87 kg)   Appears comfortable at rest. HEENT: Conjunctiva and lids normal, oropharynx clear. Neck: Supple, no elevated JVP or carotid bruits, no thyromegaly. Lungs: Clear to auscultation, nonlabored breathing at rest. Cardiac: Regular rate and rhythm, no S3 or significant systolic murmur, no pericardial rub. Abdomen: Soft, nontender, bowel sounds present, no guarding or rebound. Extremities: No pitting edema, distal pulses 2+. Skin: Warm  and dry. Musculoskeletal: No kyphosis. Neuropsychiatric: Alert and oriented x3, affect grossly appropriate.   Problem List and Plan   Atrial fibrillation Continue current management, on Tikosyn per Dr. Johney Frame, Cardizem CD and Xarelto.  Coronary atherosclerosis of native coronary artery No reported angina symptoms status post BMS the circumflex in 2008. She is reporting cough at nighttime also leg edema, will follow with an echocardiogram to reassess LVEF.  Pacemaker-Medtronic Keep follow with Dr. Johney Frame.  HYPERLIPIDEMIA-MIXED Continues on Pravachol, followed by Dr. Leandrew Koyanagi.    Jonelle Sidle, M.D., F.A.C.C.

## 2012-09-09 NOTE — Patient Instructions (Addendum)
Your physician recommends that you schedule a follow-up appointment in: 6 months. You will receive a reminder letter in the mail in about 4 months reminding you to call and schedule your appointment. If you don't receive this letter, please contact our office. Your physician recommends that you continue on your current medications as directed. Please refer to the Current Medication list given to you today. Your physician has requested that you have an echocardiogram. Echocardiography is a painless test that uses sound waves to create images of your heart. It provides your doctor with information about the size and shape of your heart and how well your heart's chambers and valves are working. This procedure takes approximately one hour. There are no restrictions for this procedure.  

## 2012-09-09 NOTE — Assessment & Plan Note (Signed)
No reported angina symptoms status post BMS the circumflex in 2008. She is reporting cough at nighttime also leg edema, will follow with an echocardiogram to reassess LVEF.

## 2012-09-09 NOTE — Assessment & Plan Note (Signed)
Continues on Pravachol, followed by Dr. Burdine. 

## 2012-09-09 NOTE — Assessment & Plan Note (Signed)
Keep follow with Dr. Johney Frame.

## 2012-09-09 NOTE — Assessment & Plan Note (Signed)
Continue current management, on Tikosyn per Dr. Johney Frame, Cardizem CD and Xarelto.

## 2012-10-05 ENCOUNTER — Other Ambulatory Visit: Payer: Medicare Other

## 2012-10-12 ENCOUNTER — Other Ambulatory Visit: Payer: Self-pay

## 2012-10-19 ENCOUNTER — Other Ambulatory Visit: Payer: Self-pay

## 2012-10-19 ENCOUNTER — Other Ambulatory Visit (INDEPENDENT_AMBULATORY_CARE_PROVIDER_SITE_OTHER): Payer: Medicare Other

## 2012-10-19 DIAGNOSIS — I059 Rheumatic mitral valve disease, unspecified: Secondary | ICD-10-CM

## 2012-10-19 DIAGNOSIS — I2510 Atherosclerotic heart disease of native coronary artery without angina pectoris: Secondary | ICD-10-CM

## 2012-10-19 DIAGNOSIS — I4891 Unspecified atrial fibrillation: Secondary | ICD-10-CM

## 2012-10-21 ENCOUNTER — Telehealth: Payer: Self-pay | Admitting: *Deleted

## 2012-10-21 NOTE — Telephone Encounter (Signed)
Patient informed. 

## 2012-10-21 NOTE — Telephone Encounter (Signed)
Message copied by Eustace Moore on Fri Oct 21, 2012 10:01 AM ------      Message from: Jonelle Sidle      Created: Fri Oct 21, 2012  8:19 AM       Reviewed. Please let her know that LV systolic function remains normal. We should continue medical therapy and observation. ------

## 2012-12-19 ENCOUNTER — Ambulatory Visit (INDEPENDENT_AMBULATORY_CARE_PROVIDER_SITE_OTHER): Payer: Medicare Other | Admitting: *Deleted

## 2012-12-19 ENCOUNTER — Encounter: Payer: Self-pay | Admitting: Internal Medicine

## 2012-12-19 DIAGNOSIS — I4891 Unspecified atrial fibrillation: Secondary | ICD-10-CM

## 2012-12-19 DIAGNOSIS — I495 Sick sinus syndrome: Secondary | ICD-10-CM

## 2012-12-19 DIAGNOSIS — I4892 Unspecified atrial flutter: Secondary | ICD-10-CM

## 2012-12-19 LAB — PACEMAKER DEVICE OBSERVATION
AL THRESHOLD: 0.5 V
ATRIAL PACING PM: 35
RV LEAD IMPEDENCE PM: 539 Ohm
RV LEAD THRESHOLD: 1 V

## 2012-12-19 NOTE — Progress Notes (Signed)
Pacemaker check in clinic. Battery longevity 10 years. Pt in Af 9.4% of time. Longest episode was 2 hours 40 minutes. + Xarelto. 9 ventricular high rate episodes---AF w/RVR. Pt to be enrolled in [facility]. [facility] 03-22-13 and ROV in May with [facility].

## 2012-12-20 ENCOUNTER — Other Ambulatory Visit: Payer: Self-pay | Admitting: *Deleted

## 2012-12-20 DIAGNOSIS — I2510 Atherosclerotic heart disease of native coronary artery without angina pectoris: Secondary | ICD-10-CM

## 2012-12-20 MED ORDER — NITROGLYCERIN 0.4 MG SL SUBL: 0.4000 mg | SUBLINGUAL_TABLET | SUBLINGUAL | Status: DC | PRN

## 2013-02-21 ENCOUNTER — Encounter: Payer: Self-pay | Admitting: Cardiology

## 2013-02-21 ENCOUNTER — Ambulatory Visit (INDEPENDENT_AMBULATORY_CARE_PROVIDER_SITE_OTHER): Payer: Medicare Other | Admitting: Cardiology

## 2013-02-21 VITALS — BP 129/69 | HR 68 | Ht 63.0 in | Wt 189.0 lb

## 2013-02-21 DIAGNOSIS — E785 Hyperlipidemia, unspecified: Secondary | ICD-10-CM

## 2013-02-21 DIAGNOSIS — I10 Essential (primary) hypertension: Secondary | ICD-10-CM

## 2013-02-21 DIAGNOSIS — I4891 Unspecified atrial fibrillation: Secondary | ICD-10-CM

## 2013-02-21 DIAGNOSIS — I2510 Atherosclerotic heart disease of native coronary artery without angina pectoris: Secondary | ICD-10-CM

## 2013-02-21 MED ORDER — POTASSIUM CHLORIDE CRYS ER 20 MEQ PO TBCR: 20.0000 meq | EXTENDED_RELEASE_TABLET | Freq: Every day | ORAL | Status: DC

## 2013-02-21 MED ORDER — PRAVASTATIN SODIUM 40 MG PO TABS: 40.0000 mg | ORAL_TABLET | Freq: Every evening | ORAL | Status: DC

## 2013-02-21 MED ORDER — DOFETILIDE 500 MCG PO CAPS: 500.0000 ug | ORAL_CAPSULE | Freq: Two times a day (BID) | ORAL | Status: DC

## 2013-02-21 MED ORDER — DILTIAZEM HCL ER COATED BEADS 360 MG PO CP24: 360.0000 mg | ORAL_CAPSULE | Freq: Every day | ORAL | Status: DC

## 2013-02-21 MED ORDER — RIVAROXABAN 20 MG PO TABS: 20.0000 mg | ORAL_TABLET | Freq: Every day | ORAL | Status: DC

## 2013-02-21 NOTE — Assessment & Plan Note (Signed)
Managed by [name] on combination of Cardizem CD, Tikosyn, and Xarelto.

## 2013-02-21 NOTE — Assessment & Plan Note (Signed)
No change in current antihypertensive regimen.

## 2013-02-21 NOTE — Patient Instructions (Signed)
Continue all current medications. Your physician wants you to follow up in: 6 months.  You will receive a reminder letter in the mail one-two months in advance.  If you don't receive a letter, please call our office to schedule the follow up appointment  Labs for BMET, CBC - do just prior to next office visit

## 2013-02-21 NOTE — Assessment & Plan Note (Signed)
Continues on Pravachol, followed by Dr. Pleas Koch.

## 2013-02-21 NOTE — Progress Notes (Signed)
Clinical Summary [name] [name] is a 68 y.o.female last seen in July 2014. She has been doing relatively well from a cardiac perspective, denies any reproducible exertional chest pain or palpitations. She reports compliance with her medications, outlined below.  ECG today shows an atrial paced rhythm.  Echocardiogram from September 2014 showed mild LVH with LVEF 14-48%, grade 1 diastolic dysfunction, upper normal left atrial chamber size, device wire noted in the right heart, PASP 33 mm mercury, prominent epicardial fat pad.   Allergies  Allergen Reactions  . Codeine     REACTION: vomiting  . Lisinopril     Cough   . Metformin     REACTION: rash    Current Outpatient Prescriptions  Medication Sig Dispense Refill  . bumetanide (BUMEX) 2 MG tablet Take 6 mg by mouth every morning.       . Canagliflozin (INVOKANA) 100 MG TABS Take 100 mg by mouth daily.      Marland Kitchen diltiazem (CARDIZEM CD) 360 MG 24 hr capsule Take 1 capsule (360 mg total) by mouth daily.  90 capsule  3  . dofetilide (TIKOSYN) 500 MCG capsule Take 1 capsule (500 mcg total) by mouth 2 (two) times daily.  180 capsule  3  . glipiZIDE (GLUCOTROL) 10 MG tablet Take 10 mg by mouth 2 (two) times daily before a meal.       . nitroGLYCERIN (NITROSTAT) 0.4 MG SL tablet Place 1 tablet (0.4 mg total) under the tongue every 5 (five) minutes as needed.  25 tablet  3  . potassium chloride SA (K-DUR,KLOR-CON) 20 MEQ tablet Take 1 tablet (20 mEq total) by mouth daily.  90 tablet  3  . pravastatin (PRAVACHOL) 40 MG tablet Take 1 tablet (40 mg total) by mouth every evening.  90 tablet  3  . Rivaroxaban (XARELTO) 20 MG TABS tablet Take 1 tablet (20 mg total) by mouth daily.  90 tablet  3  . saxagliptin HCl (ONGLYZA) 5 MG TABS tablet Take 5 mg by mouth daily.         No current facility-administered medications for this visit.    Past Medical History  Diagnosis Date  . Atrial fibrillation   . Atrial flutter   . Coronary atherosclerosis of  native coronary artery     BMS to circumflex 9/08  . Type 2 diabetes mellitus   . Hyperlipidemia   . Multiple thyroid nodules   . Tachy-brady syndrome     PPM - Medtronic    Past Surgical History  Procedure Laterality Date  . Ventral hernia repair    . Rotator cuff repair    . Vesicovaginal fistula closure w/ tah    . Breast lumpectomy    . Pacemaker placement      Medtronic    Social History [name] [name] reports that she has never smoked. She has never used smokeless tobacco. [name] [name] reports that she does not drink alcohol.  Review of Systems No cough, fevers or chills, dizziness or syncope. Otherwise negative.  Physical Examination Filed Vitals:   02/21/13 1530  BP: 129/69  Pulse: 68   Filed Weights   02/21/13 1530  Weight: 189 lb (85.73 kg)    Appears comfortable at rest.  HEENT: Conjunctiva and lids normal, oropharynx clear.  Neck: Supple, no elevated JVP or carotid bruits, no thyromegaly.  Lungs: Clear to auscultation, nonlabored breathing at rest.  Cardiac: Regular rate and rhythm, no S3 or significant systolic murmur, no pericardial rub.  Abdomen: Soft, nontender,  bowel sounds present, no guarding or rebound.  Extremities: No pitting edema, distal pulses 2+.    Problem List and Plan   Coronary atherosclerosis of native coronary artery Symptomatically stable without progressive angina on medical therapy. Continue observation.  Essential hypertension, benign No change in current antihypertensive regimen.  HYPERLIPIDEMIA-MIXED Continues on Pravachol, followed by Dr. Pleas Koch.  Atrial fibrillation Managed by [name] on combination of Cardizem CD, Tikosyn, and Xarelto.    Satira Sark, M.D., F.A.C.C.

## 2013-02-21 NOTE — Assessment & Plan Note (Signed)
Symptomatically stable without progressive angina on medical therapy. Continue observation.

## 2013-02-27 ENCOUNTER — Other Ambulatory Visit: Payer: Self-pay | Admitting: *Deleted

## 2013-02-27 MED ORDER — DOFETILIDE 500 MCG PO CAPS: 500.0000 ug | ORAL_CAPSULE | Freq: Two times a day (BID) | ORAL | Status: DC

## 2013-03-20 ENCOUNTER — Telehealth: Payer: Self-pay | Admitting: *Deleted

## 2013-03-20 MED ORDER — RIVAROXABAN 20 MG PO TABS: 20.0000 mg | ORAL_TABLET | Freq: Every day | ORAL | Status: DC

## 2013-03-20 NOTE — Telephone Encounter (Signed)
Patient wants xarelto script to go to mail order

## 2013-03-22 ENCOUNTER — Encounter: Payer: Medicare Other | Admitting: *Deleted

## 2013-04-05 ENCOUNTER — Encounter: Payer: Self-pay | Admitting: *Deleted

## 2013-04-09 LAB — MDC_IDC_ENUM_SESS_TYPE_REMOTE
Battery Impedance: 194 Ohm
Battery Voltage: 2.8 V
Brady Statistic AP VS Percent: 43 %
Brady Statistic AS VP Percent: 0 %
Brady Statistic AS VS Percent: 55 %
Date Time Interrogation Session: 20150222221532
Lead Channel Impedance Value: 472 Ohm
Lead Channel Impedance Value: 592 Ohm
Lead Channel Pacing Threshold Amplitude: 0.625 V
Lead Channel Pacing Threshold Amplitude: 1.125 V
Lead Channel Pacing Threshold Pulse Width: 0.4 ms
Lead Channel Sensing Intrinsic Amplitude: 8 mV
Lead Channel Setting Pacing Amplitude: 2 V
MDC IDC MSMT BATTERY REMAINING LONGEVITY: 117 mo
MDC IDC MSMT LEADCHNL RA SENSING INTR AMPL: 1 mV
MDC IDC MSMT LEADCHNL RV PACING THRESHOLD PULSEWIDTH: 0.4 ms
MDC IDC SET LEADCHNL RV PACING AMPLITUDE: 2.5 V
MDC IDC SET LEADCHNL RV PACING PULSEWIDTH: 0.4 ms
MDC IDC SET LEADCHNL RV SENSING SENSITIVITY: 4 mV
MDC IDC STAT BRADY AP VP PERCENT: 1 %

## 2013-04-10 ENCOUNTER — Ambulatory Visit (INDEPENDENT_AMBULATORY_CARE_PROVIDER_SITE_OTHER): Payer: Medicare Other | Admitting: *Deleted

## 2013-04-10 DIAGNOSIS — I4891 Unspecified atrial fibrillation: Secondary | ICD-10-CM

## 2013-04-10 DIAGNOSIS — I495 Sick sinus syndrome: Secondary | ICD-10-CM

## 2013-04-10 DIAGNOSIS — I4892 Unspecified atrial flutter: Secondary | ICD-10-CM

## 2013-04-17 ENCOUNTER — Other Ambulatory Visit: Payer: Self-pay | Admitting: Cardiology

## 2013-04-17 MED ORDER — POTASSIUM CHLORIDE CRYS ER 20 MEQ PO TBCR: 20.0000 meq | EXTENDED_RELEASE_TABLET | Freq: Every day | ORAL | Status: DC

## 2013-04-17 MED ORDER — DILTIAZEM HCL ER COATED BEADS 360 MG PO CP24: 360.0000 mg | ORAL_CAPSULE | Freq: Every day | ORAL | Status: DC

## 2013-04-20 ENCOUNTER — Other Ambulatory Visit: Payer: Self-pay

## 2013-04-20 MED ORDER — PRAVASTATIN SODIUM 40 MG PO TABS: 40.0000 mg | ORAL_TABLET | Freq: Every evening | ORAL | Status: DC

## 2013-04-26 ENCOUNTER — Encounter: Payer: Self-pay | Admitting: Internal Medicine

## 2013-05-17 ENCOUNTER — Encounter: Payer: Self-pay | Admitting: *Deleted

## 2013-07-03 ENCOUNTER — Other Ambulatory Visit: Payer: Self-pay | Admitting: *Deleted

## 2013-07-03 ENCOUNTER — Encounter: Payer: Self-pay | Admitting: *Deleted

## 2013-07-03 DIAGNOSIS — I4892 Unspecified atrial flutter: Secondary | ICD-10-CM

## 2013-07-03 DIAGNOSIS — I4891 Unspecified atrial fibrillation: Secondary | ICD-10-CM

## 2013-07-03 NOTE — Progress Notes (Signed)
Lab orders mailed to home address.

## 2013-07-14 ENCOUNTER — Telehealth: Payer: Self-pay | Admitting: *Deleted

## 2013-07-14 ENCOUNTER — Encounter: Payer: Self-pay | Admitting: Internal Medicine

## 2013-07-14 ENCOUNTER — Ambulatory Visit (INDEPENDENT_AMBULATORY_CARE_PROVIDER_SITE_OTHER): Payer: Medicare Other | Admitting: Internal Medicine

## 2013-07-14 VITALS — BP 147/88 | HR 84 | Ht 63.0 in | Wt 168.0 lb

## 2013-07-14 DIAGNOSIS — I495 Sick sinus syndrome: Secondary | ICD-10-CM

## 2013-07-14 DIAGNOSIS — I4892 Unspecified atrial flutter: Secondary | ICD-10-CM

## 2013-07-14 DIAGNOSIS — I4891 Unspecified atrial fibrillation: Secondary | ICD-10-CM

## 2013-07-14 LAB — MDC_IDC_ENUM_SESS_TYPE_INCLINIC
Battery Remaining Longevity: 110 mo
Battery Voltage: 2.79 V
Brady Statistic AS VP Percent: 0 %
Lead Channel Impedance Value: 560 Ohm
Lead Channel Pacing Threshold Amplitude: 0.5 V
Lead Channel Pacing Threshold Amplitude: 1 V
Lead Channel Pacing Threshold Pulse Width: 0.4 ms
Lead Channel Setting Pacing Amplitude: 2.5 V
Lead Channel Setting Pacing Pulse Width: 0.4 ms
MDC IDC MSMT BATTERY IMPEDANCE: 242 Ohm
MDC IDC MSMT LEADCHNL RA SENSING INTR AMPL: 4 mV
MDC IDC MSMT LEADCHNL RV IMPEDANCE VALUE: 649 Ohm
MDC IDC MSMT LEADCHNL RV PACING THRESHOLD PULSEWIDTH: 0.4 ms
MDC IDC MSMT LEADCHNL RV SENSING INTR AMPL: 11.2 mV
MDC IDC SESS DTM: 20150529110307
MDC IDC SET LEADCHNL RA PACING AMPLITUDE: 2 V
MDC IDC SET LEADCHNL RV SENSING SENSITIVITY: 4 mV
MDC IDC STAT BRADY AP VP PERCENT: 2 %
MDC IDC STAT BRADY AP VS PERCENT: 49 %
MDC IDC STAT BRADY AS VS PERCENT: 49 %

## 2013-07-14 NOTE — Progress Notes (Signed)
PCP: Curlene Labrum, MD  The patient presents today for routine electrophysiology followup.  Since last being seen in our clinic, the patient reports doing very well.  She is unaware of any symptoms of afib. Today, she denies symptoms of palpitations, chest pain, shortness of breath, orthopnea, PND, dizziness, presyncope, syncope, or neurologic sequela.  The patient feels that she is tolerating medications without difficulties and is otherwise without complaint today.   Past Medical History  Diagnosis Date  . Atrial fibrillation   . Atrial flutter   . Coronary atherosclerosis of native coronary artery     BMS to circumflex 9/08  . Type 2 diabetes mellitus   . Hyperlipidemia   . Multiple thyroid nodules   . Tachy-brady syndrome     PPM - Medtronic   Past Surgical History  Procedure Laterality Date  . Ventral hernia repair    . Rotator cuff repair    . Vesicovaginal fistula closure w/ tah    . Breast lumpectomy    . Pacemaker placement      Medtronic    Current Outpatient Prescriptions  Medication Sig Dispense Refill  . bumetanide (BUMEX) 2 MG tablet Take 6 mg by mouth every morning.       . diltiazem (CARDIZEM CD) 360 MG 24 hr capsule Take 1 capsule (360 mg total) by mouth daily.  90 capsule  3  . dofetilide (TIKOSYN) 500 MCG capsule Take 1 capsule (500 mcg total) by mouth 2 (two) times daily.  180 capsule  3  . glipiZIDE (GLUCOTROL) 10 MG tablet Take 10 mg by mouth 2 (two) times daily before a meal.       . nitroGLYCERIN (NITROSTAT) 0.4 MG SL tablet Place 1 tablet (0.4 mg total) under the tongue every 5 (five) minutes as needed.  25 tablet  3  . potassium chloride SA (K-DUR,KLOR-CON) 20 MEQ tablet Take 1 tablet (20 mEq total) by mouth daily.  90 tablet  3  . pravastatin (PRAVACHOL) 40 MG tablet Take 1 tablet (40 mg total) by mouth every evening.  90 tablet  3  . Rivaroxaban (XARELTO) 20 MG TABS tablet Take 1 tablet (20 mg total) by mouth daily.  90 tablet  3  . saxagliptin HCl  (ONGLYZA) 5 MG TABS tablet Take 5 mg by mouth daily.         No current facility-administered medications for this visit.    Allergies  Allergen Reactions  . Codeine     REACTION: vomiting  . Lisinopril     Cough   . Metformin     REACTION: rash    History   Social History  . Marital Status: Married    Spouse Name: N/A    Number of Children: N/A  . Years of Education: N/A   Occupational History  . Not on file.   Social History Main Topics  . Smoking status: Never Smoker   . Smokeless tobacco: Never Used  . Alcohol Use: No  . Drug Use: Not on file  . Sexual Activity: Not on file   Other Topics Concern  . Not on file   Social History Narrative  . No narrative on file    Physical Exam: Filed Vitals:   07/14/13 0854  BP: 147/88  Pulse: 84  Height: 5\' 3"  (1.6 m)  Weight: 168 lb (76.204 kg)    GEN- The patient is well appearing, alert and oriented x 3 today.   Head- normocephalic, atraumatic Eyes-  Sclera clear, conjunctiva pink Ears-  hearing intact Oropharynx- clear Neck- supple, no JVP Lymph- no cervical lymphadenopathy Lungs- Clear to ausculation bilaterally, normal work of breathing Chest- pacemaker pocket is well healed Heart- Regular rate and rhythm, no murmurs, rubs or gallops, PMI not laterally displaced GI- soft, NT, ND, + BS Extremities- no clubbing, cyanosis, trace edema Neuro- strength and sensation are intact  Pacemaker interrogation- reviewed in detail today,  See PACEART report  EKG from 1/15 and labs are reviewed ekg today reveals atrial pacing, Qtc is 483  Assessment and Plan:  1. afib Stable  (afib burden 15% by PPM interrogation) Rate control during afib is stable.  As she is asymptomatic, I have made no changes today If she develops symptoms with afib progression then I would advise ablation at that time. Repeat bmet and mg when she sees Dr Domenic Polite in July.  2. Tachy/brady Normal pacemaker function See Pace Art report No  changes today  [facility] every 3 months I will see again in a year

## 2013-07-14 NOTE — Patient Instructions (Signed)
Your physician recommends that you schedule a follow-up appointment in: 1 year with [name]. You will receive a reminder letter in the mail in about 10 months reminding you to call and schedule your appointment. If you don't receive this letter, please contact our office. Your next device check with [facility] is October 17, 2013. Your physician recommends that you continue on your current medications as directed. Please refer to the Current Medication list given to you today. Your physician recommends that you have lab work to check your Magnesium level with your already requested lab work.

## 2013-07-14 NOTE — Telephone Encounter (Signed)
Patient left message on nurse's vm to update medication list by adding invokana 300 mg daily.

## 2013-08-24 ENCOUNTER — Telehealth: Payer: Self-pay | Admitting: *Deleted

## 2013-08-24 NOTE — Telephone Encounter (Signed)
Patient informed. 

## 2013-08-24 NOTE — Telephone Encounter (Signed)
Message copied by Merlene Laughter on Thu Aug 24, 2013  4:07 PM ------      Message from: MCDOWELL, Aloha Gell      Created: Wed Aug 23, 2013  9:04 AM       Reviewed. Patient on Xarelto. Renal function and potassium are normal, hemoglobin 17. No changes for now. ------

## 2013-08-29 ENCOUNTER — Encounter: Payer: Self-pay | Admitting: Cardiology

## 2013-08-29 ENCOUNTER — Ambulatory Visit (INDEPENDENT_AMBULATORY_CARE_PROVIDER_SITE_OTHER): Payer: Medicare Other | Admitting: Cardiology

## 2013-08-29 VITALS — BP 130/74 | HR 68 | Ht 63.0 in | Wt 170.4 lb

## 2013-08-29 DIAGNOSIS — I495 Sick sinus syndrome: Secondary | ICD-10-CM

## 2013-08-29 DIAGNOSIS — I480 Paroxysmal atrial fibrillation: Secondary | ICD-10-CM

## 2013-08-29 DIAGNOSIS — I4891 Unspecified atrial fibrillation: Secondary | ICD-10-CM

## 2013-08-29 DIAGNOSIS — I2510 Atherosclerotic heart disease of native coronary artery without angina pectoris: Secondary | ICD-10-CM

## 2013-08-29 MED ORDER — BUMETANIDE 2 MG PO TABS: 2.0000 mg | ORAL_TABLET | ORAL | Status: DC

## 2013-08-29 MED ORDER — POTASSIUM CHLORIDE CRYS ER 20 MEQ PO TBCR: 20.0000 meq | EXTENDED_RELEASE_TABLET | ORAL | Status: DC

## 2013-08-29 NOTE — Assessment & Plan Note (Signed)
No active angina symptoms with history of BMS to the circumflex in 2008. She has normal LV systolic function with mild diastolic dysfunction. I asked her to cut her Bumex and potassium supplements back every other day for now. She may be able to discontinue it altogether.

## 2013-08-29 NOTE — Patient Instructions (Addendum)
Your physician recommends that you schedule a follow-up appointment in: 6 months. You will receive a reminder letter in the mail in about 4 months reminding you to call and schedule your appointment. If you don't receive this letter, please contact our office. Your physician has recommended you make the following change in your medication:  Decrease your bumex 2 mg to every other day. Decrease your potassium to every other day. Continue all other medications the same. Your physician recommends that you have lab work in 6 months just before your next visit to check your BMET,CBC and Mg levels.

## 2013-08-29 NOTE — Progress Notes (Signed)
Clinical Summary Ms. Paulsen is a 68 y.o.female last seen in January. Interval followup with [name] noted. She has been doing well. No palpitations, chest pain, or leg edema. She asked about trying to reduce her Bumex, maybe even stop diuretic therapy.  Recent lab work in July showed BUN 21, creatinine 0.9, potassium 4.2, magnesium 2.3, hemoglobin 17.0, platelets 183.  Echocardiogram from September 2014 showed mild LVH with LVEF 81-19%, grade 1 diastolic dysfunction, upper normal left atrial chamber size, device wire noted in the right heart, PASP 33 mm mercury, prominent epicardial fat pad.   Allergies  Allergen Reactions  . Codeine     REACTION: vomiting  . Lisinopril     Cough   . Metformin     REACTION: rash    Current Outpatient Prescriptions  Medication Sig Dispense Refill  . bumetanide (BUMEX) 2 MG tablet Take 2 mg by mouth every morning.       . Canagliflozin (INVOKANA) 300 MG TABS Take 1 tablet by mouth daily.      Marland Kitchen diltiazem (CARDIZEM CD) 360 MG 24 hr capsule Take 1 capsule (360 mg total) by mouth daily.  90 capsule  3  . dofetilide (TIKOSYN) 500 MCG capsule Take 1 capsule (500 mcg total) by mouth 2 (two) times daily.  180 capsule  3  . glipiZIDE (GLUCOTROL) 10 MG tablet Take 10 mg by mouth 2 (two) times daily before a meal.       . nitroGLYCERIN (NITROSTAT) 0.4 MG SL tablet Place 1 tablet (0.4 mg total) under the tongue every 5 (five) minutes as needed.  25 tablet  3  . potassium chloride SA (K-DUR,KLOR-CON) 20 MEQ tablet Take 1 tablet (20 mEq total) by mouth daily.  90 tablet  3  . pravastatin (PRAVACHOL) 40 MG tablet Take 1 tablet (40 mg total) by mouth every evening.  90 tablet  3  . Rivaroxaban (XARELTO) 20 MG TABS tablet Take 1 tablet (20 mg total) by mouth daily.  90 tablet  3  . saxagliptin HCl (ONGLYZA) 5 MG TABS tablet Take 5 mg by mouth daily.         No current facility-administered medications for this visit.    Past Medical History  Diagnosis Date    . Atrial fibrillation   . Atrial flutter   . Coronary atherosclerosis of native coronary artery     BMS to circumflex 9/08  . Type 2 diabetes mellitus   . Hyperlipidemia   . Multiple thyroid nodules   . Tachy-brady syndrome     PPM - Medtronic    Social History Ms. Way reports that she has never smoked. She has never used smokeless tobacco. Ms. Ganesh reports that she does not drink alcohol.  Review of Systems Trouble with seasonal allergies. Other systems reviewed and negative.  Physical Examination Filed Vitals:   08/29/13 0848  BP: 130/74  Pulse: 68   Filed Weights   08/29/13 0848  Weight: 170 lb 6.4 oz (77.293 kg)    Appears comfortable at rest.  HEENT: Conjunctiva and lids normal, oropharynx clear.  Neck: Supple, no elevated JVP or carotid bruits, no thyromegaly.  Lungs: Clear to auscultation, nonlabored breathing at rest.  Cardiac: Regular rate and rhythm, no S3 or significant systolic murmur, no pericardial rub.  Abdomen: Soft, nontender, bowel sounds present, no guarding or rebound.  Extremities: No pitting edema, distal pulses 2+.  Skin: Warm and dry. Musculoskeletal: No kyphosis. Neuropsychiatric: Alert and oriented x3, affect appropriate.  Problem List and Plan   Atrial fibrillation Well controlled on current regimen. Recent lab work reviewed and stable. Followup BMET with magnesium and CBC for next visit.  BRADYCARDIA-TACHYCARDIA SYNDROME Status post Medtronic pacemaker, followed by [name].  Coronary atherosclerosis of native coronary artery No active angina symptoms with history of BMS to the circumflex in 2008. She has normal LV systolic function with mild diastolic dysfunction. I asked her to cut her Bumex and potassium supplements back every other day for now. She may be able to discontinue it altogether.    Satira Sark, M.D., F.A.C.C.

## 2013-08-29 NOTE — Assessment & Plan Note (Signed)
Well controlled on current regimen. Recent lab work reviewed and stable. Followup BMET with magnesium and CBC for next visit.

## 2013-08-29 NOTE — Assessment & Plan Note (Signed)
Status post Medtronic pacemaker, followed by Dr. Allred. 

## 2013-10-09 ENCOUNTER — Telehealth: Payer: Self-pay | Admitting: *Deleted

## 2013-10-09 NOTE — Telephone Encounter (Signed)
Patient called with c/o lower back pain since March & pain in legs / outher thighs x first of this month.  Also, c/o urinary issues - frequency, urgency.  Thinks this may be related to her [name] [name] (was reading insert).  Advised her to first see her PMD to evaluate the urinary issues as that can cause back pain as well.  Informed her to call back if issue not resolved.  Patient verbalized understanding.

## 2013-10-17 ENCOUNTER — Encounter: Payer: Self-pay | Admitting: Internal Medicine

## 2013-10-17 ENCOUNTER — Ambulatory Visit (INDEPENDENT_AMBULATORY_CARE_PROVIDER_SITE_OTHER): Payer: Medicare Other | Admitting: *Deleted

## 2013-10-17 ENCOUNTER — Telehealth: Payer: Self-pay | Admitting: Cardiology

## 2013-10-17 DIAGNOSIS — I480 Paroxysmal atrial fibrillation: Secondary | ICD-10-CM

## 2013-10-17 DIAGNOSIS — I4891 Unspecified atrial fibrillation: Secondary | ICD-10-CM

## 2013-10-17 DIAGNOSIS — I495 Sick sinus syndrome: Secondary | ICD-10-CM

## 2013-10-17 LAB — MDC_IDC_ENUM_SESS_TYPE_REMOTE
Battery Impedance: 266 Ohm
Battery Voltage: 2.79 V
Brady Statistic AP VS Percent: 71 %
Brady Statistic AS VS Percent: 23 %
Date Time Interrogation Session: 20150901154550
Lead Channel Impedance Value: 506 Ohm
Lead Channel Impedance Value: 627 Ohm
Lead Channel Pacing Threshold Amplitude: 0.5 V
Lead Channel Pacing Threshold Amplitude: 1.125 V
Lead Channel Pacing Threshold Pulse Width: 0.4 ms
Lead Channel Pacing Threshold Pulse Width: 0.4 ms
Lead Channel Setting Pacing Amplitude: 2 V
Lead Channel Setting Pacing Amplitude: 2.5 V
Lead Channel Setting Sensing Sensitivity: 4 mV
MDC IDC MSMT BATTERY REMAINING LONGEVITY: 103 mo
MDC IDC MSMT LEADCHNL RA SENSING INTR AMPL: 2.8 mV
MDC IDC MSMT LEADCHNL RV SENSING INTR AMPL: 11.2 mV
MDC IDC SET LEADCHNL RV PACING PULSEWIDTH: 0.4 ms
MDC IDC STAT BRADY AP VP PERCENT: 5 %
MDC IDC STAT BRADY AS VP PERCENT: 1 %

## 2013-10-17 NOTE — Progress Notes (Signed)
Remote pacemaker transmission.   

## 2013-10-17 NOTE — Telephone Encounter (Signed)
Spoke with pt and reminded pt of remote transmission that is due today. Pt verbalized understanding.   

## 2013-10-25 ENCOUNTER — Encounter: Payer: Self-pay | Admitting: Cardiology

## 2013-11-07 ENCOUNTER — Telehealth: Payer: Self-pay | Admitting: *Deleted

## 2013-11-07 NOTE — Telephone Encounter (Signed)
Patient notified and verbalized understanding. 

## 2013-11-07 NOTE — Telephone Encounter (Signed)
Noted. I saw her in July, she had lab work at that point showing normal renal function and no evidence of anemia. If she is having recurring hematochezia, she should be evaluated by GI and keep Dr. Pleas Koch aware of these symptoms as well. It could be that her hemorrhoid is bleeding intermittently.

## 2013-11-07 NOTE — Telephone Encounter (Signed)
Message left on voice mail - questions when she began Xarelto.  Returned call - discussed above with patient.  Looks like Dr. Dannielle Burn started her on this March 2012.  Stated that she has been having some issues off and on with bleeding.  Recently within the last several months - urinary issues.  This has since resolved & stated that PMD give her pill for bladder & now this has helped.  Since last June, she reports rectal bleeding (bright red blood) about once a month.  Stated she did have a colonoscopy and everything checked out okay.  Stated that she does have a hemorrhoid that she has had since having her children.  States about once a month when she gets up in middle of the night to urinate, she notices BTB in commode.  States that blood is not coming from vaginal area.  Also, states she is not having bowel movement or straining.  Just happens spontaneously & only occurs at night.  She has not seen GI & had this specific issue worked up.  PMD ordered colonoscopy.

## 2014-01-08 ENCOUNTER — Other Ambulatory Visit: Payer: Self-pay | Admitting: *Deleted

## 2014-01-08 MED ORDER — NITROGLYCERIN 0.4 MG SL SUBL: 0.4000 mg | SUBLINGUAL_TABLET | SUBLINGUAL | Status: DC | PRN

## 2014-01-18 ENCOUNTER — Ambulatory Visit (INDEPENDENT_AMBULATORY_CARE_PROVIDER_SITE_OTHER): Payer: Medicare Other | Admitting: *Deleted

## 2014-01-18 ENCOUNTER — Encounter: Payer: Self-pay | Admitting: Internal Medicine

## 2014-01-18 DIAGNOSIS — I495 Sick sinus syndrome: Secondary | ICD-10-CM

## 2014-01-18 LAB — MDC_IDC_ENUM_SESS_TYPE_REMOTE
Battery Impedance: 290 Ohm
Battery Remaining Longevity: 102 mo
Battery Voltage: 2.79 V
Brady Statistic AP VP Percent: 4 %
Brady Statistic AP VS Percent: 64 %
Brady Statistic AS VP Percent: 1 %
Brady Statistic AS VS Percent: 31 %
Date Time Interrogation Session: 20151203141902
Lead Channel Impedance Value: 484 Ohm
Lead Channel Impedance Value: 573 Ohm
Lead Channel Pacing Threshold Amplitude: 0.5 V
Lead Channel Pacing Threshold Pulse Width: 0.4 ms
Lead Channel Pacing Threshold Pulse Width: 0.4 ms
Lead Channel Setting Pacing Amplitude: 2.5 V
Lead Channel Setting Pacing Pulse Width: 0.4 ms
MDC IDC MSMT LEADCHNL RA SENSING INTR AMPL: 2.8 mV
MDC IDC MSMT LEADCHNL RV PACING THRESHOLD AMPLITUDE: 1.25 V
MDC IDC MSMT LEADCHNL RV SENSING INTR AMPL: 8 mV
MDC IDC SET LEADCHNL RA PACING AMPLITUDE: 2 V
MDC IDC SET LEADCHNL RV SENSING SENSITIVITY: 4 mV

## 2014-01-19 NOTE — Progress Notes (Signed)
Remote pacemaker transmission.   

## 2014-01-29 ENCOUNTER — Other Ambulatory Visit: Payer: Self-pay | Admitting: *Deleted

## 2014-01-29 MED ORDER — POTASSIUM CHLORIDE CRYS ER 20 MEQ PO TBCR: 20.0000 meq | EXTENDED_RELEASE_TABLET | ORAL | Status: DC

## 2014-01-29 MED ORDER — DILTIAZEM HCL ER COATED BEADS 360 MG PO CP24: 360.0000 mg | ORAL_CAPSULE | Freq: Every day | ORAL | Status: DC

## 2014-02-01 ENCOUNTER — Other Ambulatory Visit: Payer: Self-pay | Admitting: *Deleted

## 2014-02-01 MED ORDER — PRAVASTATIN SODIUM 40 MG PO TABS: 40.0000 mg | ORAL_TABLET | Freq: Every evening | ORAL | Status: DC

## 2014-02-05 ENCOUNTER — Encounter: Payer: Self-pay | Admitting: Cardiology

## 2014-02-14 ENCOUNTER — Other Ambulatory Visit: Payer: Self-pay | Admitting: *Deleted

## 2014-02-14 MED ORDER — DOFETILIDE 500 MCG PO CAPS: 500.0000 ug | ORAL_CAPSULE | Freq: Two times a day (BID) | ORAL | Status: DC

## 2014-02-14 MED ORDER — RIVAROXABAN 20 MG PO TABS: 20.0000 mg | ORAL_TABLET | Freq: Every day | ORAL | Status: DC

## 2014-02-14 NOTE — Telephone Encounter (Signed)
Xarelto refill done.

## 2014-02-14 NOTE — Telephone Encounter (Signed)
Tikosyn refilled

## 2014-02-19 ENCOUNTER — Other Ambulatory Visit: Payer: Self-pay | Admitting: *Deleted

## 2014-02-19 MED ORDER — RIVAROXABAN 20 MG PO TABS: 20.0000 mg | ORAL_TABLET | Freq: Every day | ORAL | Status: DC

## 2014-02-27 ENCOUNTER — Other Ambulatory Visit: Payer: Self-pay | Admitting: *Deleted

## 2014-02-27 DIAGNOSIS — I10 Essential (primary) hypertension: Secondary | ICD-10-CM

## 2014-02-27 DIAGNOSIS — I4891 Unspecified atrial fibrillation: Secondary | ICD-10-CM

## 2014-02-27 NOTE — Progress Notes (Signed)
Orders faxed to [facility].

## 2014-03-01 ENCOUNTER — Telehealth: Payer: Self-pay | Admitting: *Deleted

## 2014-03-01 NOTE — Telephone Encounter (Signed)
-----   Message from Satira Sark, MD sent at 02/28/2014 11:23 AM EST ----- Reviewed. Creatinine normal at 0.8, potassium normal at 4.1, magnesium normal at 2.2.

## 2014-03-01 NOTE — Telephone Encounter (Signed)
Patient informed. 

## 2014-03-12 ENCOUNTER — Ambulatory Visit: Payer: Medicare Other | Admitting: Cardiology

## 2014-03-20 ENCOUNTER — Other Ambulatory Visit: Payer: Self-pay | Admitting: *Deleted

## 2014-03-20 MED ORDER — DILTIAZEM HCL ER COATED BEADS 180 MG PO CP24: 360.0000 mg | ORAL_CAPSULE | Freq: Every day | ORAL | Status: DC

## 2014-03-20 NOTE — Telephone Encounter (Signed)
Patient received a letter stating that [facility] would no longer cover cardizem cd 360 mg. Patient advised that cardizem cd 180 mg taking 2 daily would be sent to [facility] in place of the 360 mg daily. Patient verbalized understanding of plan.

## 2014-04-06 ENCOUNTER — Ambulatory Visit (INDEPENDENT_AMBULATORY_CARE_PROVIDER_SITE_OTHER): Payer: Commercial Managed Care - HMO | Admitting: Cardiology

## 2014-04-06 ENCOUNTER — Encounter: Payer: Self-pay | Admitting: Cardiology

## 2014-04-06 ENCOUNTER — Ambulatory Visit: Payer: Self-pay | Admitting: Cardiology

## 2014-04-06 VITALS — BP 118/62 | HR 73 | Ht 63.0 in | Wt 171.0 lb

## 2014-04-06 DIAGNOSIS — I480 Paroxysmal atrial fibrillation: Secondary | ICD-10-CM

## 2014-04-06 DIAGNOSIS — I495 Sick sinus syndrome: Secondary | ICD-10-CM

## 2014-04-06 DIAGNOSIS — I2510 Atherosclerotic heart disease of native coronary artery without angina pectoris: Secondary | ICD-10-CM

## 2014-04-06 DIAGNOSIS — I4891 Unspecified atrial fibrillation: Secondary | ICD-10-CM

## 2014-04-06 NOTE — Patient Instructions (Signed)
Your physician wants you to follow-up in: 6 months with Dr. Ferne Reus will receive a reminder letter in the mail two months in advance. If you don't receive a letter, please call our office to schedule the follow-up appointment.  Your physician recommends that you continue on your current medications as directed. Please refer to the Current Medication list given to you today.  Your physician recommends that you return for lab work JUST BEFORE YOUR NEXT VISIT BMP/CBC/MAG  Thank you for choosing [facility]!!

## 2014-04-06 NOTE — Progress Notes (Signed)
Cardiology Office Note  Date: 04/06/2014   ID: [name], DOB 08/19/1945, MRN [id number]  PCP: Curlene Labrum, MD  Primary Cardiologist: Rozann Lesches, MD   Chief Complaint  Patient presents with  . Coronary Artery Disease  . Atrial arrhythmias    History of Present Illness: [name] [name] is a 69 y.o. female last seen in July 2015. Device follow-up continues with [name]. Last device interrogation demonstrated 5.7% atrial fibrillation burden. She presents for a routine visit today, denies any significant palpitations on current medical regimen. She reports no bleeding problems on Xarelto, and continues on both Tikosyn and Cardizem CD. ECG is reviewed below.  Follow-up lab work shows normal potassium, magnesium, and hemoglobin.  She does not endorse any angina symptoms at this time. No nitroglycerin use.   Past Medical History  Diagnosis Date  . Atrial fibrillation   . Atrial flutter   . Coronary atherosclerosis of native coronary artery     BMS to circumflex 9/08  . Type 2 diabetes mellitus   . Hyperlipidemia   . Multiple thyroid nodules   . Tachy-brady syndrome     PPM - Medtronic    Past Surgical History  Procedure Laterality Date  . Ventral hernia repair    . Rotator cuff repair    . Vesicovaginal fistula closure w/ tah    . Breast lumpectomy    . Pacemaker placement      Medtronic    Current Outpatient Prescriptions  Medication Sig Dispense Refill  . bumetanide (BUMEX) 2 MG tablet Take 1 tablet (2 mg total) by mouth every other day. (Patient taking differently: Take 2 mg by mouth daily. ) 45 tablet 3  . Canagliflozin (INVOKANA) 300 MG TABS Take 1 tablet by mouth daily.    Marland Kitchen diltiazem (CARDIZEM CD) 180 MG 24 hr capsule Take 2 capsules (360 mg total) by mouth daily. 180 capsule 3  . dofetilide (TIKOSYN) 500 MCG capsule Take 1 capsule (500 mcg total) by mouth 2 (two) times daily. 180 capsule 3  . glipiZIDE (GLUCOTROL) 10 MG tablet Take 10 mg by mouth  2 (two) times daily before a meal.     . nitroGLYCERIN (NITROSTAT) 0.4 MG SL tablet Place 1 tablet (0.4 mg total) under the tongue every 5 (five) minutes x 3 doses as needed for chest pain (if no relief after 3 rd dose, proceed to ED for an evaluation). 100 tablet 3  . potassium chloride SA (K-DUR,KLOR-CON) 20 MEQ tablet Take 1 tablet (20 mEq total) by mouth every other day. (Patient taking differently: Take 20 mEq by mouth once. ) 45 tablet 3  . pravastatin (PRAVACHOL) 40 MG tablet Take 1 tablet (40 mg total) by mouth every evening. 90 tablet 3  . rivaroxaban (XARELTO) 20 MG TABS tablet Take 1 tablet (20 mg total) by mouth daily. 90 tablet 3  . saxagliptin HCl (ONGLYZA) 5 MG TABS tablet Take 5 mg by mouth daily.       No current facility-administered medications for this visit.    Allergies:  Codeine; Lisinopril; and Metformin   Social History: The patient  reports that she has never smoked. She has never used smokeless tobacco. She reports that she does not drink alcohol.   ROS:  Please see the history of present illness. Otherwise, complete review of systems is positive for arthritic pains.  All other systems are reviewed and negative.    Physical Exam: VS:  BP 118/62 mmHg  Pulse 73  Ht 5'  3" (1.6 m)  Wt 171 lb (77.565 kg)  BMI 30.30 kg/m2  SpO2 94%, BMI Body mass index is 30.3 kg/(m^2).  Wt Readings from Last 3 Encounters:  04/06/14 171 lb (77.565 kg)  08/29/13 170 lb 6.4 oz (77.293 kg)  07/14/13 168 lb (76.204 kg)     Appears comfortable at rest.  HEENT: Conjunctiva and lids normal, oropharynx clear.  Neck: Supple, no elevated JVP or carotid bruits, no thyromegaly.  Lungs: Clear to auscultation, nonlabored breathing at rest.  Cardiac: Regular rate and rhythm, no S3 or significant systolic murmur, no pericardial rub.  Abdomen: Soft, nontender, bowel sounds present, no guarding or rebound.  Extremities: No pitting edema, distal pulses 2+.  Skin: Warm and  dry. Musculoskeletal: No kyphosis. Neuropsychiatric: Alert and oriented x3, affect appropriate.   ECG: ECG is ordered today and reviewed demonstrating an atrial paced rhythm with QTC 400 ms.   Recent Labwork:  Lab work from January 2016 showed BUN 18, creatinine 0.8, potassium 4.1, magnesium 2.2, hemoglobin 15.5, platelets 157.  Other Studies Reviewed Today:  Echocardiogram from September 2014 showed mild LVH with LVEF 97-67%, grade 1 diastolic dysfunction, upper normal left atrial chamber size, device wire noted in the right heart, PASP 33 mm mercury, prominent epicardial fat pad.  Assessment and Plan:  1. Paroxysmal atrial fibrillation, well controlled symptomatically on current regimen. ECG and recent lab work reviewed. No changes made today.  2. CAD status post BMS to circumflex in 2008, currently without angina symptoms. Continue observation for now.  3. 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.  Current medicines are reviewed at length with the patient today.  The patient does not have concerns regarding medicines.   Orders Placed This Encounter  Procedures  . Basic Metabolic Panel (BMET)  . CBC w/Diff  . Magnesium  . EKG 12-Lead    Disposition: FU with me in 6 months.   Signed, Satira Sark, MD, [facility] 04/06/2014 3:56 PM    [facility] [address], [place], [place] [address] Phone: [phone]; Fax: [phone]

## 2014-04-23 ENCOUNTER — Ambulatory Visit (INDEPENDENT_AMBULATORY_CARE_PROVIDER_SITE_OTHER): Payer: Commercial Managed Care - HMO | Admitting: *Deleted

## 2014-04-23 DIAGNOSIS — I495 Sick sinus syndrome: Secondary | ICD-10-CM

## 2014-04-23 NOTE — Progress Notes (Signed)
Remote pacemaker transmission.   

## 2014-04-24 LAB — MDC_IDC_ENUM_SESS_TYPE_REMOTE
Battery Impedance: 338 Ohm
Battery Remaining Longevity: 98 mo
Brady Statistic AP VP Percent: 3 %
Brady Statistic AP VS Percent: 65 %
Brady Statistic AS VP Percent: 1 %
Brady Statistic AS VS Percent: 31 %
Lead Channel Impedance Value: 553 Ohm
Lead Channel Impedance Value: 573 Ohm
Lead Channel Pacing Threshold Amplitude: 1.125 V
Lead Channel Pacing Threshold Pulse Width: 0.4 ms
Lead Channel Pacing Threshold Pulse Width: 0.4 ms
Lead Channel Sensing Intrinsic Amplitude: 1.4 mV
Lead Channel Sensing Intrinsic Amplitude: 8 mV
Lead Channel Setting Pacing Amplitude: 2 V
Lead Channel Setting Sensing Sensitivity: 4 mV
MDC IDC MSMT BATTERY VOLTAGE: 2.79 V
MDC IDC MSMT LEADCHNL RA PACING THRESHOLD AMPLITUDE: 0.625 V
MDC IDC SESS DTM: 20160307143031
MDC IDC SET LEADCHNL RV PACING AMPLITUDE: 2.5 V
MDC IDC SET LEADCHNL RV PACING PULSEWIDTH: 0.4 ms

## 2014-04-30 ENCOUNTER — Encounter: Payer: Self-pay | Admitting: Cardiology

## 2014-05-03 ENCOUNTER — Encounter: Payer: Self-pay | Admitting: Internal Medicine

## 2014-06-28 ENCOUNTER — Encounter: Payer: Self-pay | Admitting: *Deleted

## 2014-06-29 ENCOUNTER — Encounter: Payer: Self-pay | Admitting: Internal Medicine

## 2014-06-29 ENCOUNTER — Ambulatory Visit (INDEPENDENT_AMBULATORY_CARE_PROVIDER_SITE_OTHER): Payer: Commercial Managed Care - HMO | Admitting: Internal Medicine

## 2014-06-29 VITALS — BP 158/82 | HR 52 | Ht 63.0 in | Wt 174.0 lb

## 2014-06-29 DIAGNOSIS — I480 Paroxysmal atrial fibrillation: Secondary | ICD-10-CM | POA: Diagnosis not present

## 2014-06-29 DIAGNOSIS — I10 Essential (primary) hypertension: Secondary | ICD-10-CM

## 2014-06-29 LAB — CUP PACEART INCLINIC DEVICE CHECK
Battery Voltage: 2.79 V
Brady Statistic AS VP Percent: 1 %
Brady Statistic AS VS Percent: 31 %
Date Time Interrogation Session: 20160513121710
Lead Channel Impedance Value: 522 Ohm
Lead Channel Impedance Value: 580 Ohm
Lead Channel Sensing Intrinsic Amplitude: 4 mV
MDC IDC MSMT BATTERY IMPEDANCE: 338 Ohm
MDC IDC MSMT BATTERY REMAINING LONGEVITY: 98 mo
MDC IDC MSMT LEADCHNL RA PACING THRESHOLD AMPLITUDE: 0.5 V
MDC IDC MSMT LEADCHNL RA PACING THRESHOLD PULSEWIDTH: 0.4 ms
MDC IDC MSMT LEADCHNL RV PACING THRESHOLD AMPLITUDE: 1.25 V
MDC IDC MSMT LEADCHNL RV PACING THRESHOLD PULSEWIDTH: 0.4 ms
MDC IDC MSMT LEADCHNL RV SENSING INTR AMPL: 11.2 mV
MDC IDC SET LEADCHNL RA PACING AMPLITUDE: 2 V
MDC IDC SET LEADCHNL RV PACING AMPLITUDE: 2.5 V
MDC IDC SET LEADCHNL RV PACING PULSEWIDTH: 0.4 ms
MDC IDC SET LEADCHNL RV SENSING SENSITIVITY: 4 mV
MDC IDC STAT BRADY AP VP PERCENT: 3 %
MDC IDC STAT BRADY AP VS PERCENT: 65 %

## 2014-06-29 NOTE — Progress Notes (Signed)
PCP: Curlene Labrum, MD CC: Afib  The patient presents today for routine electrophysiology followup.  Since last being seen in our clinic, the patient reports doing very well.  She is unaware of any symptoms of afib. Today, she denies symptoms of palpitations, chest pain, shortness of breath, orthopnea, PND, dizziness, presyncope, syncope, or neurologic sequela.  The patient feels that she is tolerating medications without difficulties and is otherwise without complaint today.   Past Medical History  Diagnosis Date  . Atrial fibrillation   . Atrial flutter   . Coronary atherosclerosis of native coronary artery     BMS to circumflex 9/08  . Type 2 diabetes mellitus   . Hyperlipidemia   . Multiple thyroid nodules   . Tachy-brady syndrome     PPM - Medtronic   Past Surgical History  Procedure Laterality Date  . Ventral hernia repair    . Rotator cuff repair    . Vesicovaginal fistula closure w/ tah    . Breast lumpectomy    . Pacemaker placement      Medtronic    Current Outpatient Prescriptions  Medication Sig Dispense Refill  . bumetanide (BUMEX) 2 MG tablet Take 1 tablet (2 mg total) by mouth every other day. (Patient taking differently: Take 2 mg by mouth daily. ) 45 tablet 3  . Canagliflozin (INVOKANA) 300 MG TABS Take 1 tablet by mouth daily.    . cephALEXin (KEFLEX) 500 MG capsule Take 1,000 mg by mouth 2 (two) times daily.    Marland Kitchen diltiazem (CARDIZEM CD) 180 MG 24 hr capsule Take 360 mg by mouth daily.    Marland Kitchen dofetilide (TIKOSYN) 500 MCG capsule Take 1 capsule (500 mcg total) by mouth 2 (two) times daily. 180 capsule 3  . glipiZIDE (GLUCOTROL) 10 MG tablet Take 10 mg by mouth 2 (two) times daily before a meal.     . nitroGLYCERIN (NITROSTAT) 0.4 MG SL tablet Place 1 tablet (0.4 mg total) under the tongue every 5 (five) minutes x 3 doses as needed for chest pain (if no relief after 3 rd dose, proceed to ED for an evaluation). 100 tablet 3  . potassium chloride SA (K-DUR,KLOR-CON)  20 MEQ tablet Take 1 tablet (20 mEq total) by mouth every other day. (Patient taking differently: Take 20 mEq by mouth once. ) 45 tablet 3  . pravastatin (PRAVACHOL) 40 MG tablet Take 1 tablet (40 mg total) by mouth every evening. 90 tablet 3  . rivaroxaban (XARELTO) 20 MG TABS tablet Take 1 tablet (20 mg total) by mouth daily. 90 tablet 3  . saxagliptin HCl (ONGLYZA) 5 MG TABS tablet Take 5 mg by mouth daily.       No current facility-administered medications for this visit.    Allergies  Allergen Reactions  . Codeine     REACTION: vomiting  . Lisinopril     Cough   . Metformin     REACTION: rash    History   Social History  . Marital Status: Married    Spouse Name: N/A  . Number of Children: N/A  . Years of Education: N/A   Occupational History  . Not on file.   Social History Main Topics  . Smoking status: Never Smoker   . Smokeless tobacco: Never Used  . Alcohol Use: No  . Drug Use: Not on file  . Sexual Activity: Not on file   Other Topics Concern  . Not on file   Social History Narrative    Physical Exam:  Filed Vitals:   06/29/14 1202  BP: 158/82  Pulse: 52  Height: 5\' 3"  (1.6 m)  Weight: 174 lb (78.926 kg)  SpO2: 98%    GEN- The patient is well appearing, alert and oriented x 3 today.   Head- normocephalic, atraumatic Eyes-  Sclera clear, conjunctiva pink Ears- hearing intact Oropharynx- clear Neck- supple, no JVP Lymph- no cervical lymphadenopathy Lungs- Clear to ausculation bilaterally, normal work of breathing Chest- pacemaker pocket is well healed Heart- Regular rate and rhythm, no murmurs, rubs or gallops, PMI not laterally displaced GI- soft, NT, ND, + BS Extremities- no clubbing, cyanosis, trace edema Neuro- strength and sensation are intact  Pacemaker interrogation- reviewed in detail today,  See PACEART report  EKG from 2/16 and labs are reviewed ekg today reveals atrial pacing, Qtc is stable  Assessment and Plan:  1.  afib Improved (afib burden 4% by PPM interrogation--> 15% last year) Rate control during afib is stable.  As she is asymptomatic, I have made no changes today If she develops symptoms with afib progression then I would advise ablation at that time. Repeat bmet and mg when she sees Dr Domenic Polite in August.  2. Tachy/brady Normal pacemaker function See Claudia Desanct[facility] Art report No changes today  carelink every 3 months I will see again in a year

## 2014-06-29 NOTE — Patient Instructions (Signed)
Your physician recommends that you continue on your current medications as directed. Please refer to the Current Medication list given to you today. Carelink device check 10/01/14. Your physician recommends that you schedule a follow-up appointment in: 1 year. You will receive a reminder letter in the mail in about 10 months reminding you to call and schedule your appointment. If you don't receive this letter, please contact our office.

## 2014-07-24 ENCOUNTER — Other Ambulatory Visit: Payer: Self-pay | Admitting: Cardiology

## 2014-07-24 MED ORDER — DOFETILIDE 500 MCG PO CAPS: 500.0000 ug | ORAL_CAPSULE | Freq: Two times a day (BID) | ORAL | Status: DC

## 2014-07-24 NOTE — Telephone Encounter (Signed)
Medication sent to pharmacy  

## 2014-07-24 NOTE — Telephone Encounter (Signed)
dofetilide (TIKOSYN) 500 MCG capsule needs to be faxed to [facility] # [phone]

## 2014-07-27 ENCOUNTER — Encounter: Payer: Medicare Other | Admitting: Internal Medicine

## 2014-09-13 ENCOUNTER — Telehealth: Payer: Self-pay | Admitting: *Deleted

## 2014-09-13 NOTE — Telephone Encounter (Signed)
Notes Recorded by Laurine Blazer, LPN on 7/35/3299 at 2:42 PM Patient notified, copy to pmd.

## 2014-09-13 NOTE — Telephone Encounter (Signed)
-----   Message from Satira Sark, MD sent at 09/12/2014 10:11 PM EDT ----- Reviewed. Normal renal function, potassium, magnesium, and Hgb.

## 2014-09-17 ENCOUNTER — Ambulatory Visit (INDEPENDENT_AMBULATORY_CARE_PROVIDER_SITE_OTHER): Payer: Commercial Managed Care - HMO | Admitting: Cardiology

## 2014-09-17 ENCOUNTER — Encounter: Payer: Self-pay | Admitting: Cardiology

## 2014-09-17 VITALS — BP 118/80 | HR 84 | Ht 63.0 in | Wt 182.0 lb

## 2014-09-17 DIAGNOSIS — I480 Paroxysmal atrial fibrillation: Secondary | ICD-10-CM

## 2014-09-17 DIAGNOSIS — I2510 Atherosclerotic heart disease of native coronary artery without angina pectoris: Secondary | ICD-10-CM

## 2014-09-17 NOTE — Patient Instructions (Signed)
Your physician recommends that you continue on your current medications as directed. Please refer to the Current Medication list given to you today. Your physician recommends that you schedule a follow-up appointment in: 6 months. You will receive a reminder letter in the mail in about 4 months reminding you to call and schedule your appointment. If you don't receive this letter, please contact our office. 

## 2014-09-17 NOTE — Progress Notes (Signed)
Cardiology Office Note  Date: 09/17/2014   ID: [name] [name], DOB 05/14/1945, MRN [id number]  PCP: Curlene Labrum, MD  Primary Cardiologist: Rozann Lesches, MD   Chief Complaint  Patient presents with  . Coronary Artery Disease  . Atrial arrhythmias    History of Present Illness: [name] [name] is a 69 y.o. female last seen in February. Device follow-up continues with [name]. She presents for a follow-up visit today, does not endorse any palpitations or chest pain. She has had functional limitations related to back discomfort and what sounds like neuropathic leg pain.  We reviewed her medications which are outlined below. She had recent lab work which was also reviewed. She denies any bleeding problems on Xarelto.   Past Medical History  Diagnosis Date  . Atrial fibrillation   . Atrial flutter   . Coronary atherosclerosis of native coronary artery     BMS to circumflex 9/08  . Type 2 diabetes mellitus   . Hyperlipidemia   . Multiple thyroid nodules   . Tachy-brady syndrome     PPM - Medtronic    Current Outpatient Prescriptions  Medication Sig Dispense Refill  . bumetanide (BUMEX) 2 MG tablet Take 1 tablet (2 mg total) by mouth every other day. (Patient taking differently: Take 2 mg by mouth daily. ) 45 tablet 3  . Canagliflozin (INVOKANA) 300 MG TABS Take 1 tablet by mouth daily.    Marland Kitchen diltiazem (CARDIZEM CD) 180 MG 24 hr capsule Take 360 mg by mouth daily.    Marland Kitchen dofetilide (TIKOSYN) 500 MCG capsule Take 1 capsule (500 mcg total) by mouth 2 (two) times daily. 180 capsule 3  . glipiZIDE (GLUCOTROL) 10 MG tablet Take 10 mg by mouth 2 (two) times daily before a meal.     . nitroGLYCERIN (NITROSTAT) 0.4 MG SL tablet Place 1 tablet (0.4 mg total) under the tongue every 5 (five) minutes x 3 doses as needed for chest pain (if no relief after 3 rd dose, proceed to ED for an evaluation). 100 tablet 3  . potassium chloride SA (K-DUR,KLOR-CON) 20 MEQ tablet Take 1 tablet (20 mEq  total) by mouth every other day. (Patient taking differently: Take 20 mEq by mouth once. ) 45 tablet 3  . pravastatin (PRAVACHOL) 40 MG tablet Take 1 tablet (40 mg total) by mouth every evening. 90 tablet 3  . rivaroxaban (XARELTO) 20 MG TABS tablet Take 1 tablet (20 mg total) by mouth daily. 90 tablet 3  . saxagliptin HCl (ONGLYZA) 5 MG TABS tablet Take 5 mg by mouth daily.       No current facility-administered medications for this visit.    Allergies:  Codeine; Lisinopril; and Metformin   Social History: The patient  reports that she has never smoked. She has never used smokeless tobacco. She reports that she does not drink alcohol.   ROS:  Please see the history of present illness. Otherwise, complete review of systems is positive for none.  All other systems are reviewed and negative.   Physical Exam: VS:  BP 118/80 mmHg  Pulse 84  Ht 5\' 3"  (1.6 m)  Wt 182 lb (82.555 kg)  BMI 32.25 kg/m2  SpO2 95%, BMI Body mass index is 32.25 kg/(m^2).  Wt Readings from Last 3 Encounters:  09/17/14 182 lb (82.555 kg)  06/29/14 174 lb (78.926 kg)  04/06/14 171 lb (77.565 kg)    Appears comfortable at rest.  HEENT: Conjunctiva and lids normal, oropharynx clear.  Neck: Supple,  no elevated JVP or carotid bruits, no thyromegaly.  Lungs: Clear to auscultation, nonlabored breathing at rest.  Cardiac: Regular rate and rhythm, no S3 or significant systolic murmur, no pericardial rub.  Abdomen: Soft, nontender, bowel sounds present, no guarding or rebound.  Extremities: No pitting edema, distal pulses 2+.  Skin: Warm and dry. Musculoskeletal: No kyphosis. Neuropsychiatric: Alert and oriented x3, affect appropriate.  ECG: ECG is not ordered today.   Recent Labwork:  09/11/2014: BUN 21, creatinine 0.7, potassium 4.6 , magnesium 2.5  Other Studies Reviewed Today:  Echocardiogram from September 2014 showed mild LVH with LVEF 88-11%, grade 1 diastolic dysfunction, upper normal left atrial  chamber size, device wire noted in the right heart, PASP 33 mm mercury, prominent epicardial fat pad.  Assessment and Plan:  1. Paroxysmal atrial fibrillation, symptomatically well controlled on current regimen. Recent lab work reviewed. No changes made today.  2. Symptomatically stable CAD without active angina symptoms.  Current medicines were reviewed with the patient today.  Disposition: FU with me in 6 months.   Signed, Satira Sark, MD, [facility] 09/17/2014 1:23 PM    [facility] [address], [place], [place] [address] Phone: [phone]; Fax: [phone]

## 2014-10-01 ENCOUNTER — Ambulatory Visit (INDEPENDENT_AMBULATORY_CARE_PROVIDER_SITE_OTHER): Payer: Commercial Managed Care - HMO | Admitting: *Deleted

## 2014-10-01 ENCOUNTER — Encounter: Payer: Self-pay | Admitting: Internal Medicine

## 2014-10-01 DIAGNOSIS — I495 Sick sinus syndrome: Secondary | ICD-10-CM | POA: Diagnosis not present

## 2014-10-01 NOTE — Progress Notes (Signed)
Remote pacemaker transmission.   

## 2014-10-09 LAB — CUP PACEART REMOTE DEVICE CHECK
Battery Impedance: 363 Ohm
Battery Remaining Longevity: 98 mo
Brady Statistic AP VP Percent: 1 %
Brady Statistic AP VS Percent: 62 %
Brady Statistic AS VS Percent: 37 %
Lead Channel Impedance Value: 652 Ohm
Lead Channel Pacing Threshold Pulse Width: 0.4 ms
Lead Channel Sensing Intrinsic Amplitude: 1.4 mV
Lead Channel Sensing Intrinsic Amplitude: 8 mV
Lead Channel Setting Pacing Amplitude: 2 V
Lead Channel Setting Pacing Pulse Width: 0.4 ms
MDC IDC MSMT BATTERY VOLTAGE: 2.79 V
MDC IDC MSMT LEADCHNL RA IMPEDANCE VALUE: 598 Ohm
MDC IDC MSMT LEADCHNL RA PACING THRESHOLD AMPLITUDE: 0.625 V
MDC IDC MSMT LEADCHNL RA PACING THRESHOLD PULSEWIDTH: 0.4 ms
MDC IDC MSMT LEADCHNL RV PACING THRESHOLD AMPLITUDE: 1 V
MDC IDC SESS DTM: 20160815121824
MDC IDC SET LEADCHNL RV PACING AMPLITUDE: 2.5 V
MDC IDC SET LEADCHNL RV SENSING SENSITIVITY: 4 mV
MDC IDC STAT BRADY AS VP PERCENT: 0 %

## 2014-10-19 ENCOUNTER — Encounter: Payer: Self-pay | Admitting: Cardiology

## 2015-01-02 ENCOUNTER — Ambulatory Visit (INDEPENDENT_AMBULATORY_CARE_PROVIDER_SITE_OTHER): Payer: Commercial Managed Care - HMO | Admitting: *Deleted

## 2015-01-02 ENCOUNTER — Telehealth: Payer: Self-pay | Admitting: Cardiology

## 2015-01-02 DIAGNOSIS — I495 Sick sinus syndrome: Secondary | ICD-10-CM

## 2015-01-02 NOTE — Telephone Encounter (Signed)
Message left informing patient that we have sent a generic prescription to her pharmacy for [place] already.

## 2015-01-02 NOTE — Telephone Encounter (Signed)
Would like to know if she can take dofetilde generic for [facility]

## 2015-01-02 NOTE — Progress Notes (Signed)
Remote pacemaker transmission.   

## 2015-01-09 LAB — CUP PACEART REMOTE DEVICE CHECK
Battery Impedance: 387 Ohm
Battery Remaining Longevity: 94 mo
Brady Statistic AP VP Percent: 1 %
Brady Statistic AS VS Percent: 36 %
Implantable Lead Implant Date: 20110510
Implantable Lead Implant Date: 20110510
Implantable Lead Model: 5092
Lead Channel Impedance Value: 795 Ohm
Lead Channel Setting Pacing Amplitude: 2 V
Lead Channel Setting Pacing Amplitude: 2.5 V
Lead Channel Setting Sensing Sensitivity: 4 mV
MDC IDC LEAD LOCATION: 753859
MDC IDC LEAD LOCATION: 753860
MDC IDC MSMT BATTERY VOLTAGE: 2.79 V
MDC IDC MSMT LEADCHNL RA IMPEDANCE VALUE: 498 Ohm
MDC IDC SESS DTM: 20161116152633
MDC IDC SET LEADCHNL RV PACING PULSEWIDTH: 0.4 ms
MDC IDC STAT BRADY AP VS PERCENT: 64 %
MDC IDC STAT BRADY AS VP PERCENT: 0 %

## 2015-01-16 ENCOUNTER — Encounter: Payer: Self-pay | Admitting: Cardiology

## 2015-01-22 ENCOUNTER — Other Ambulatory Visit: Payer: Self-pay | Admitting: Cardiology

## 2015-02-12 ENCOUNTER — Telehealth: Payer: Self-pay | Admitting: Cardiology

## 2015-02-12 NOTE — Telephone Encounter (Signed)
[name] [name] called stating that she continues to have some rectal bleeding. States off and on since April.  she is seeing her PCP today for the rectal bleeding.  [name] [name] states that she is concerned about taking the blood thinner.

## 2015-02-12 NOTE — Telephone Encounter (Signed)
I last saw her in August. She had stable labwork at that time. If she has had rectal bleeding, she should certainly get checked out further by PCP. If we need to hold Xarelto for testing, they need to let us know. Also go ahead and check CBC and BMET if not already done.

## 2015-02-12 NOTE — Telephone Encounter (Signed)
Will send as FYI to Dr Domenic Polite

## 2015-02-13 ENCOUNTER — Encounter: Payer: Self-pay | Admitting: *Deleted

## 2015-02-13 NOTE — Telephone Encounter (Signed)
Saw [name] [name], [place] yesterday.  Sent for labs this morning, but has not heard back yet.  Stated she was given some hemorrhoid suppositories also.    Stated that she had been taking off/on just this week.  Stated that she does not have the bleeding when she holds the medication, but does when she takes it.    Request will be sent to [facility] to try & retrieve labs.

## 2015-02-14 NOTE — Telephone Encounter (Signed)
Labs received & scanned into EPIC for review.

## 2015-02-14 NOTE — Telephone Encounter (Signed)
Lab work reviewed. Renal function remains normal, so dose of Xarelto is appropriate. Potassium normal. Hemoglobin also normal at 15.1. Platelets are just mildly reduced at 134. It sounds like she might need to have a GI workup if she is having recurrent hematochezia, she should discuss this with her PCP. It might be that she has a reversible cause of GI bleeding that can be treated. As far as stopping Xarelto and going to aspirin with PAF, she would need to consider the stroke risk. Annual risk of stroke on aspirin would be approximately 5.1% versus 2.1% if she can stay on Xarelto.

## 2015-02-14 NOTE — Telephone Encounter (Signed)
Patient notified.  Stated that her PMD did discuss labs with her also & suggest GI referral.

## 2015-02-19 ENCOUNTER — Other Ambulatory Visit: Payer: Self-pay | Admitting: *Deleted

## 2015-02-19 MED ORDER — RIVAROXABAN 20 MG PO TABS
20.0000 mg | ORAL_TABLET | Freq: Every day | ORAL | Status: DC
Start: 2015-02-19 — End: 2015-03-14

## 2015-02-19 MED ORDER — PRAVASTATIN SODIUM 40 MG PO TABS: 40.0000 mg | ORAL_TABLET | Freq: Every evening | ORAL | Status: DC

## 2015-02-19 MED ORDER — NITROGLYCERIN 0.4 MG SL SUBL: 0.4000 mg | SUBLINGUAL_TABLET | SUBLINGUAL | Status: DC | PRN

## 2015-02-20 ENCOUNTER — Telehealth (INDEPENDENT_AMBULATORY_CARE_PROVIDER_SITE_OTHER): Payer: Self-pay | Admitting: Family Medicine

## 2015-02-20 NOTE — Telephone Encounter (Signed)
Ms. Kulhanek left a message saying someone suggested Dr. Laural Golden to her. She said her PCP can't understand why she's been bleeding on and off for six months. She's wondering if she can come in to be evaluated. I called the pt and she said the bleeding is due to hemorrhoids. I told her (after being advised) that we are unable to assist her if that's the true cause of the bleeding and she should be referred to a surgeon by her PCP.  Thank you.

## 2015-02-25 ENCOUNTER — Encounter: Payer: Self-pay | Admitting: Internal Medicine

## 2015-02-27 DIAGNOSIS — R05 Cough: Secondary | ICD-10-CM | POA: Diagnosis not present

## 2015-02-27 DIAGNOSIS — J209 Acute bronchitis, unspecified: Secondary | ICD-10-CM | POA: Diagnosis not present

## 2015-03-04 DIAGNOSIS — R05 Cough: Secondary | ICD-10-CM | POA: Diagnosis not present

## 2015-03-04 DIAGNOSIS — I517 Cardiomegaly: Secondary | ICD-10-CM | POA: Diagnosis not present

## 2015-03-04 DIAGNOSIS — J209 Acute bronchitis, unspecified: Secondary | ICD-10-CM | POA: Diagnosis not present

## 2015-03-04 DIAGNOSIS — J9811 Atelectasis: Secondary | ICD-10-CM | POA: Diagnosis not present

## 2015-03-04 DIAGNOSIS — Z950 Presence of cardiac pacemaker: Secondary | ICD-10-CM | POA: Diagnosis not present

## 2015-03-11 ENCOUNTER — Ambulatory Visit: Payer: Commercial Managed Care - HMO | Admitting: Gastroenterology

## 2015-03-13 ENCOUNTER — Ambulatory Visit: Payer: Commercial Managed Care - HMO | Admitting: Cardiology

## 2015-03-14 ENCOUNTER — Other Ambulatory Visit: Payer: Self-pay | Admitting: *Deleted

## 2015-03-14 MED ORDER — PRAVASTATIN SODIUM 40 MG PO TABS: 40.0000 mg | ORAL_TABLET | Freq: Every evening | ORAL | Status: DC

## 2015-03-14 MED ORDER — RIVAROXABAN 20 MG PO TABS: 20.0000 mg | ORAL_TABLET | Freq: Every day | ORAL | Status: DC

## 2015-03-14 NOTE — Telephone Encounter (Signed)
Patient said she no longer has [facility] and needed her prescriptions sent to her new mail order pharmacy. Patient said she did not know the name of the new pharmacy but she would call office back with the name of the company.

## 2015-03-14 NOTE — Telephone Encounter (Signed)
Patient uses [facility] now. New prescription sent for the requested medications.

## 2015-03-15 ENCOUNTER — Ambulatory Visit: Payer: Self-pay | Admitting: Gastroenterology

## 2015-03-27 ENCOUNTER — Ambulatory Visit: Payer: Self-pay | Admitting: Gastroenterology

## 2015-03-28 DIAGNOSIS — E119 Type 2 diabetes mellitus without complications: Secondary | ICD-10-CM | POA: Diagnosis not present

## 2015-03-28 DIAGNOSIS — Z7984 Long term (current) use of oral hypoglycemic drugs: Secondary | ICD-10-CM | POA: Diagnosis not present

## 2015-03-28 DIAGNOSIS — H52223 Regular astigmatism, bilateral: Secondary | ICD-10-CM | POA: Diagnosis not present

## 2015-03-28 DIAGNOSIS — E782 Mixed hyperlipidemia: Secondary | ICD-10-CM | POA: Diagnosis not present

## 2015-03-28 DIAGNOSIS — Z1231 Encounter for screening mammogram for malignant neoplasm of breast: Secondary | ICD-10-CM | POA: Diagnosis not present

## 2015-03-28 DIAGNOSIS — M545 Low back pain: Secondary | ICD-10-CM | POA: Diagnosis not present

## 2015-03-28 DIAGNOSIS — I10 Essential (primary) hypertension: Secondary | ICD-10-CM | POA: Diagnosis not present

## 2015-03-28 DIAGNOSIS — Z23 Encounter for immunization: Secondary | ICD-10-CM | POA: Diagnosis not present

## 2015-03-28 DIAGNOSIS — E1165 Type 2 diabetes mellitus with hyperglycemia: Secondary | ICD-10-CM | POA: Diagnosis not present

## 2015-03-28 DIAGNOSIS — H5203 Hypermetropia, bilateral: Secondary | ICD-10-CM | POA: Diagnosis not present

## 2015-03-28 DIAGNOSIS — I480 Paroxysmal atrial fibrillation: Secondary | ICD-10-CM | POA: Diagnosis not present

## 2015-03-29 ENCOUNTER — Encounter: Payer: Self-pay | Admitting: Cardiology

## 2015-03-29 ENCOUNTER — Ambulatory Visit (INDEPENDENT_AMBULATORY_CARE_PROVIDER_SITE_OTHER): Payer: Medicare Other | Admitting: Cardiology

## 2015-03-29 VITALS — BP 119/80 | HR 61 | Ht 63.0 in | Wt 171.0 lb

## 2015-03-29 DIAGNOSIS — I2510 Atherosclerotic heart disease of native coronary artery without angina pectoris: Secondary | ICD-10-CM

## 2015-03-29 DIAGNOSIS — I4891 Unspecified atrial fibrillation: Secondary | ICD-10-CM | POA: Diagnosis not present

## 2015-03-29 DIAGNOSIS — E785 Hyperlipidemia, unspecified: Secondary | ICD-10-CM | POA: Diagnosis not present

## 2015-03-29 DIAGNOSIS — I480 Paroxysmal atrial fibrillation: Secondary | ICD-10-CM

## 2015-03-29 NOTE — Patient Instructions (Signed)
Your physician wants you to follow-up in: [place] DR. Domenic Polite You will receive a reminder letter in the mail two months in advance. If you don't receive a letter, please call our office to schedule the follow-up appointment.  Your physician recommends that you continue on your current medications as directed. Please refer to the Current Medication list given to you today.  Your physician recommends that you return for lab work in: [place] VISIT  Thank you for choosing [facility]!!

## 2015-03-29 NOTE — Progress Notes (Signed)
Cardiology Office Note  Date: 03/29/2015   ID: [name] [name], DOB [birth year], MRN [id number]  PCP: Curlene Labrum, MD  Primary Cardiologist: Rozann Lesches, MD   Chief Complaint  Patient presents with  . PAF  . Coronary Artery Disease    History of Present Illness: [name] [name] is a 70 y.o. female last seen in August 2016.She presents today for a routine follow-up visit. Overall, no complaints of palpitations or chest pain with typical activities. She has NYHA class II dyspnea.  She continues to follow with [name]. Recent pacemaker interrogation revealed 2.2% atrial fibrillation episodes.  She has been experiencing some hemorrhoidal bleeding and has a follow-up visit pending with GI. She states that this has settled down within the last month. She has continued on Xarelto. Follow-up lab work reviewed below. ECG today personally reviewed by me showing atrial paced rhythm with leftward axis and nonspecific ST changes.  Past Medical History  Diagnosis Date  . Atrial fibrillation ([place])   . Atrial flutter ([place])   . Coronary atherosclerosis of native coronary artery     BMS to circumflex 9/08  . Type 2 diabetes mellitus ([place])   . Hyperlipidemia   . Multiple thyroid nodules   . 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 bumetanide (BUMEX) 2 MG tablet Take 1 mg by mouth daily.    . Canagliflozin (INVOKANA) 300 MG TABS Take 1 tablet by mouth daily.    Marland Kitchen diltiazem (CARDIZEM CD) 180 MG 24 hr capsule Take 180 mg by mouth 2 (two) times daily.     Marland Kitchen dofetilide (TIKOSYN) 500 MCG capsule Take 1 capsule (500 mcg total) by mouth 2 (two) times daily. 180 capsule 3  . glipiZIDE (GLUCOTROL) 10 MG tablet Take 10 mg by mouth 2 (two) times daily before a meal.     . nitroGLYCERIN (NITROSTAT) 0.4 MG SL tablet Place 1 tablet (0.4 mg total) under the tongue every 5 (five) minutes x 3 doses as needed for chest pain (if  no relief after 3 rd dose, proceed to ED for an evaluation). 75 tablet 1  . potassium chloride SA (K-DUR,KLOR-CON) 20 MEQ tablet TAKE 1 TABLET EVERY OTHER DAY 45 tablet 3  . pravastatin (PRAVACHOL) 40 MG tablet Take 1 tablet (40 mg total) by mouth every evening. 90 tablet 3  . rivaroxaban (XARELTO) 20 MG TABS tablet Take 1 tablet (20 mg total) by mouth daily. 90 tablet 3  . saxagliptin HCl (ONGLYZA) 5 MG TABS tablet Take 5 mg by mouth daily.       No current facility-administered medications for this visit.   Allergies:  Codeine; Lisinopril; and Metformin   Social History: The patient  reports that she has never smoked. She has never used smokeless tobacco. She reports that she does not drink alcohol.   ROS:  Please see the history of present illness. Otherwise, complete review of systems is positive for intermittent leg weakness.  All other systems are reviewed and negative.   Physical Exam: VS:  BP 119/80 mmHg  Pulse 61  Ht 5\' 3"  (1.6 m)  Wt 171 lb (77.565 kg)  BMI 30.30 kg/m2  SpO2 97%, BMI Body mass index is 30.3 kg/(m^2).  Wt Readings from Last 3 Encounters:  03/29/15 171 lb (77.565 kg)  09/17/14 182 lb (82.555 kg)  06/29/14 174 lb (78.926 kg)    Appears comfortable at rest.  HEENT:  Conjunctiva and lids normal, oropharynx clear.  Neck: Supple, no elevated JVP or carotid bruits, no thyromegaly.  Lungs: Clear to auscultation, nonlabored breathing at rest.  Cardiac: Regular rate and rhythm, no S3 or significant systolic murmur, no pericardial rub.  Abdomen: Soft, nontender, bowel sounds present, no guarding or rebound.  Extremities: No pitting edema, distal pulses 2+.   ECG: I personally reviewed the prior tracing from 04/06/2014 which showed an atrial paced rhythm with left anterior fascicular block and nonspecific T-wave changes.  Recent Labwork:  December 2016: BUN 17,  creatinine 0.6 , AST 20, ALT 17, potassium 4.4 , hemoglobin 15.1, platelets 134  Other Studies  Reviewed Today:  Echocardiogram September 2014: Mild LVH with LVEF 123456, grade 1 diastolic dysfunction, upper normal left atrial chamber size, device wire noted in the right heart, PASP 33 mm mercury, prominent epicardial fat pad.  Assessment and Plan:  1. Paroxysmal atrial fibrillation with low rate of recurrence. She does not report any significant palpitations. Plan to continue current medical regimen including Xarelto, Cardizem CD, and Tikosyn. Follow-up CBC and BMET for next visit.  2. CAD status post previous BMS to circumflex in 2008. She does not report any active angina symptoms. Continue observation.  3. Hyperlipidemia, continues on Pravachol.  Current medicines were reviewed with the patient today.   Orders Placed This Encounter  Procedures  . EKG 12-Lead    Disposition: FU with me in 6 months.   Signed, Satira Sark, MD, [facility] 03/29/2015 2:33 PM    [facility] at [place], [place], [place] [address] Phone: [phone]; Fax: [phone]

## 2015-04-02 ENCOUNTER — Ambulatory Visit: Payer: Self-pay | Admitting: Gastroenterology

## 2015-04-03 ENCOUNTER — Other Ambulatory Visit: Payer: Self-pay | Admitting: *Deleted

## 2015-04-03 ENCOUNTER — Telehealth: Payer: Self-pay | Admitting: Cardiology

## 2015-04-03 ENCOUNTER — Ambulatory Visit (INDEPENDENT_AMBULATORY_CARE_PROVIDER_SITE_OTHER): Payer: Medicare Other | Admitting: *Deleted

## 2015-04-03 DIAGNOSIS — H25812 Combined forms of age-related cataract, left eye: Secondary | ICD-10-CM | POA: Diagnosis not present

## 2015-04-03 DIAGNOSIS — I495 Sick sinus syndrome: Secondary | ICD-10-CM

## 2015-04-03 DIAGNOSIS — H029 Unspecified disorder of eyelid: Secondary | ICD-10-CM | POA: Diagnosis not present

## 2015-04-03 DIAGNOSIS — H40053 Ocular hypertension, bilateral: Secondary | ICD-10-CM | POA: Diagnosis not present

## 2015-04-03 MED ORDER — DILTIAZEM HCL ER COATED BEADS 180 MG PO CP24: 180.0000 mg | ORAL_CAPSULE | Freq: Two times a day (BID) | ORAL | Status: DC

## 2015-04-03 MED ORDER — DOFETILIDE 500 MCG PO CAPS: 500.0000 ug | ORAL_CAPSULE | Freq: Two times a day (BID) | ORAL | Status: DC

## 2015-04-03 NOTE — Progress Notes (Signed)
Remote pacemaker transmission.   

## 2015-04-03 NOTE — Telephone Encounter (Signed)
Spoke with pt and reminded pt of remote transmission that is due today. Pt verbalized understanding.   

## 2015-04-08 LAB — CUP PACEART REMOTE DEVICE CHECK
Brady Statistic AP VP Percent: 1 %
Brady Statistic AP VS Percent: 65 %
Brady Statistic AS VP Percent: 0 %
Date Time Interrogation Session: 20170215155428
Implantable Lead Location: 753860
Implantable Lead Model: 5076
Implantable Lead Model: 5092
Lead Channel Pacing Threshold Amplitude: 0.5 V
Lead Channel Pacing Threshold Amplitude: 1.125 V
Lead Channel Pacing Threshold Pulse Width: 0.4 ms
Lead Channel Pacing Threshold Pulse Width: 0.4 ms
Lead Channel Sensing Intrinsic Amplitude: 8 mV
Lead Channel Setting Pacing Amplitude: 2 V
Lead Channel Setting Sensing Sensitivity: 4 mV
MDC IDC LEAD IMPLANT DT: 20110510
MDC IDC LEAD IMPLANT DT: 20110510
MDC IDC LEAD LOCATION: 753859
MDC IDC MSMT BATTERY IMPEDANCE: 436 Ohm
MDC IDC MSMT BATTERY REMAINING LONGEVITY: 90 mo
MDC IDC MSMT BATTERY VOLTAGE: 2.79 V
MDC IDC MSMT LEADCHNL RA IMPEDANCE VALUE: 529 Ohm
MDC IDC MSMT LEADCHNL RA SENSING INTR AMPL: 2.8 mV
MDC IDC MSMT LEADCHNL RV IMPEDANCE VALUE: 622 Ohm
MDC IDC SET LEADCHNL RV PACING AMPLITUDE: 2.5 V
MDC IDC SET LEADCHNL RV PACING PULSEWIDTH: 0.4 ms
MDC IDC STAT BRADY AS VS PERCENT: 34 %

## 2015-04-12 ENCOUNTER — Encounter: Payer: Self-pay | Admitting: Cardiology

## 2015-04-15 DIAGNOSIS — H2511 Age-related nuclear cataract, right eye: Secondary | ICD-10-CM | POA: Diagnosis not present

## 2015-04-15 DIAGNOSIS — H25811 Combined forms of age-related cataract, right eye: Secondary | ICD-10-CM | POA: Diagnosis not present

## 2015-04-15 DIAGNOSIS — H2512 Age-related nuclear cataract, left eye: Secondary | ICD-10-CM | POA: Diagnosis not present

## 2015-04-25 ENCOUNTER — Other Ambulatory Visit: Payer: Self-pay | Admitting: *Deleted

## 2015-04-25 MED ORDER — POTASSIUM CHLORIDE CRYS ER 20 MEQ PO TBCR: 20.0000 meq | EXTENDED_RELEASE_TABLET | ORAL | Status: DC

## 2015-04-29 DIAGNOSIS — H2512 Age-related nuclear cataract, left eye: Secondary | ICD-10-CM | POA: Diagnosis not present

## 2015-04-30 ENCOUNTER — Ambulatory Visit: Payer: Self-pay | Admitting: Gastroenterology

## 2015-05-01 ENCOUNTER — Ambulatory Visit: Payer: Self-pay | Admitting: Gastroenterology

## 2015-06-21 ENCOUNTER — Ambulatory Visit (INDEPENDENT_AMBULATORY_CARE_PROVIDER_SITE_OTHER): Payer: Medicare Other | Admitting: Internal Medicine

## 2015-06-21 ENCOUNTER — Encounter: Payer: Self-pay | Admitting: Internal Medicine

## 2015-06-21 VITALS — BP 136/83 | HR 70 | Ht 63.0 in | Wt 177.4 lb

## 2015-06-21 DIAGNOSIS — R609 Edema, unspecified: Secondary | ICD-10-CM

## 2015-06-21 DIAGNOSIS — I480 Paroxysmal atrial fibrillation: Secondary | ICD-10-CM | POA: Diagnosis not present

## 2015-06-21 DIAGNOSIS — I495 Sick sinus syndrome: Secondary | ICD-10-CM | POA: Diagnosis not present

## 2015-06-21 LAB — CUP PACEART INCLINIC DEVICE CHECK
Battery Impedance: 485 Ohm
Battery Remaining Longevity: 92 mo
Brady Statistic AP VP Percent: 0 %
Date Time Interrogation Session: 20170505124904
Implantable Lead Implant Date: 20110510
Implantable Lead Location: 753860
Implantable Lead Model: 5092
Lead Channel Impedance Value: 559 Ohm
Lead Channel Pacing Threshold Amplitude: 0.5 V
Lead Channel Pacing Threshold Amplitude: 0.5 V
Lead Channel Pacing Threshold Pulse Width: 0.4 ms
Lead Channel Pacing Threshold Pulse Width: 0.4 ms
Lead Channel Pacing Threshold Pulse Width: 0.4 ms
Lead Channel Sensing Intrinsic Amplitude: 4 mV
Lead Channel Setting Sensing Sensitivity: 2.8 mV
MDC IDC LEAD IMPLANT DT: 20110510
MDC IDC LEAD LOCATION: 753859
MDC IDC MSMT BATTERY VOLTAGE: 2.79 V
MDC IDC MSMT LEADCHNL RA IMPEDANCE VALUE: 464 Ohm
MDC IDC MSMT LEADCHNL RV PACING THRESHOLD AMPLITUDE: 1.25 V
MDC IDC MSMT LEADCHNL RV PACING THRESHOLD AMPLITUDE: 1.375 V
MDC IDC MSMT LEADCHNL RV PACING THRESHOLD PULSEWIDTH: 0.4 ms
MDC IDC MSMT LEADCHNL RV SENSING INTR AMPL: 8 mV
MDC IDC SET LEADCHNL RA PACING AMPLITUDE: 2 V
MDC IDC SET LEADCHNL RV PACING AMPLITUDE: 2.75 V
MDC IDC SET LEADCHNL RV PACING PULSEWIDTH: 0.4 ms
MDC IDC STAT BRADY AP VS PERCENT: 69 %
MDC IDC STAT BRADY AS VP PERCENT: 0 %
MDC IDC STAT BRADY AS VS PERCENT: 31 %

## 2015-06-21 MED ORDER — MEDICAL COMPRESSION SOCKS MISC: 1.0000 | Status: DC

## 2015-06-21 NOTE — Progress Notes (Signed)
PCP: Curlene Labrum, MD CC: Afib  The patient presents today for routine electrophysiology followup.  Since last being seen in our clinic, the patient reports doing very well.  She is unaware of any symptoms of afib.  Her concern is with L leg swelling.  This is chronic.  Today, she denies symptoms of palpitations, chest pain, shortness of breath, orthopnea, PND, dizziness, presyncope, syncope, or neurologic sequela.  The patient feels that she is tolerating medications without difficulties and is otherwise without complaint today.   Past Medical History  Diagnosis Date  . Atrial fibrillation ([place])   . Atrial flutter ([place])   . Coronary atherosclerosis of native coronary artery     BMS to circumflex 9/08  . Type 2 diabetes mellitus ([place])   . Hyperlipidemia   . Multiple thyroid nodules   . Tachy-brady syndrome (HCC)     PPM - Medtronic   Past Surgical History  Procedure Laterality Date  . Ventral hernia repair    . Rotator cuff repair    . Vesicovaginal fistula closure w/ tah    . Breast lumpectomy    . Pacemaker placement      Medtronic    Current Outpatient Prescriptions  Medication Sig Dispense Refill  . bumetanide (BUMEX) 2 MG tablet Take 1 mg by mouth daily.    . Canagliflozin (INVOKANA) 300 MG TABS Take 1 tablet by mouth daily.    Marland Kitchen diltiazem (CARDIZEM CD) 180 MG 24 hr capsule Take 1 capsule (180 mg total) by mouth 2 (two) times daily. 180 capsule 3  . dofetilide (TIKOSYN) 500 MCG capsule Take 1 capsule (500 mcg total) by mouth 2 (two) times daily. 180 capsule 3  . glipiZIDE (GLUCOTROL) 10 MG tablet Take 10 mg by mouth 2 (two) times daily before a meal.     . nitroGLYCERIN (NITROSTAT) 0.4 MG SL tablet Place 1 tablet (0.4 mg total) under the tongue every 5 (five) minutes x 3 doses as needed for chest pain (if no relief after 3 rd dose, proceed to ED for an evaluation). 75 tablet 1  . potassium chloride SA (K-DUR,KLOR-CON) 20 MEQ tablet Take 1 tablet (20 mEq total) by mouth  every other day. 45 tablet 3  . pravastatin (PRAVACHOL) 40 MG tablet Take 1 tablet (40 mg total) by mouth every evening. 90 tablet 3  . rivaroxaban (XARELTO) 20 MG TABS tablet Take 1 tablet (20 mg total) by mouth daily. 90 tablet 3  . saxagliptin HCl (ONGLYZA) 5 MG TABS tablet Take 5 mg by mouth daily.       No current facility-administered medications for this visit.    Allergies  Allergen Reactions  . Codeine     REACTION: vomiting  . Lisinopril     Cough   . Metformin     REACTION: rash    Social History   Social History  . Marital Status: Married    Spouse Name: N/A  . Number of Children: N/A  . Years of Education: N/A   Occupational History  . Not on file.   Social History Main Topics  . Smoking status: Never Smoker   . Smokeless tobacco: Never Used  . Alcohol Use: No  . Drug Use: Not on file  . Sexual Activity: Not on file   Other Topics Concern  . Not on file   Social History Narrative    Physical Exam: Filed Vitals:   06/21/15 0831  BP: 136/83  Pulse: 70  Height: 5\' 3"  (1.6 m)  Weight: 177 lb 6.4 oz (80.468 kg)  SpO2: 97%    GEN- The patient is well appearing, alert and oriented x 3 today.   Head- normocephalic, atraumatic Eyes-  Sclera clear, conjunctiva pink Ears- hearing intact Oropharynx- clear Neck- supple, no JVP Lymph- no cervical lymphadenopathy Lungs- Clear to ausculation bilaterally, normal work of breathing Chest- pacemaker pocket is well healed Heart- Regular rate and rhythm, no murmurs, rubs or gallops, PMI not laterally displaced GI- soft, NT, ND, + BS Extremities- no clubbing, cyanosis, trace edema Neuro- strength and sensation are intact  Pacemaker interrogation- reviewed in detail today,  See PACEART report ekg 03/29/15 reveals atrial paced rhythm, PR 271 msec, LAHB, Qtc 412 msec   Assessment and Plan:  1. afib Improved (afib burden 1.6% by PPM interrogation today, --> 4% 2016--> 15% 2015) Rate control during afib is  stable.  As she is asymptomatic, I have made no changes today If she develops symptoms with afib progression then I would advise ablation at that time. Bmet, mg , cbc today  2. Tachy/brady Normal pacemaker function See Pace Art report No changes today  3. LLE (venous insuffiencey) Support hose prescribed today 2 gram sodium diet She has seen vascular surgery previously.  I offered referral back and she declines at this time  carelink every 3 months I will see again in a year  Thompson Grayer MD, [facility] 06/21/2015 9:24 AM

## 2015-06-21 NOTE — Patient Instructions (Signed)
Your physician recommends that you continue on your current medications as directed. Please refer to the Current Medication list given to you today. Your physician recommends that you return for lab work today to check your BMET, CBC and Magnesium levels. Next device check on 09/23/15. Your physician recommends that you schedule a follow-up appointment in: 1 year with [name]. Please schedule this appointment today. You have been given a prescription for compression hose.

## 2015-06-27 DIAGNOSIS — Z961 Presence of intraocular lens: Secondary | ICD-10-CM | POA: Diagnosis not present

## 2015-07-05 DIAGNOSIS — I480 Paroxysmal atrial fibrillation: Secondary | ICD-10-CM | POA: Diagnosis not present

## 2015-07-05 DIAGNOSIS — Z0000 Encounter for general adult medical examination without abnormal findings: Secondary | ICD-10-CM | POA: Diagnosis not present

## 2015-07-08 DIAGNOSIS — E1165 Type 2 diabetes mellitus with hyperglycemia: Secondary | ICD-10-CM | POA: Diagnosis not present

## 2015-07-08 DIAGNOSIS — E782 Mixed hyperlipidemia: Secondary | ICD-10-CM | POA: Diagnosis not present

## 2015-07-08 DIAGNOSIS — I10 Essential (primary) hypertension: Secondary | ICD-10-CM | POA: Diagnosis not present

## 2015-07-30 DIAGNOSIS — R300 Dysuria: Secondary | ICD-10-CM | POA: Diagnosis not present

## 2015-07-30 DIAGNOSIS — R350 Frequency of micturition: Secondary | ICD-10-CM | POA: Diagnosis not present

## 2015-08-09 ENCOUNTER — Ambulatory Visit (INDEPENDENT_AMBULATORY_CARE_PROVIDER_SITE_OTHER): Payer: Medicare Other | Admitting: Cardiology

## 2015-08-09 ENCOUNTER — Encounter: Payer: Self-pay | Admitting: Cardiology

## 2015-08-09 VITALS — BP 146/75 | HR 71 | Ht 63.0 in | Wt 180.0 lb

## 2015-08-09 DIAGNOSIS — M7989 Other specified soft tissue disorders: Secondary | ICD-10-CM

## 2015-08-09 DIAGNOSIS — R600 Localized edema: Secondary | ICD-10-CM

## 2015-08-09 MED ORDER — BUMETANIDE 1 MG PO TABS: ORAL_TABLET | ORAL | Status: DC

## 2015-08-09 NOTE — Progress Notes (Signed)
Clinical Summary Ms. Gappa is a 70 y.o.female regular patient of Dr Domenic Polite, she is seen today as an add on patient for increased LE edema.   1. LE edema - chronic history of edema, seen by vascular prevoiusly for venous insufficiency - echo 10/2012 LVEF 123456, grade I diastolic dysfunction - increased leg swelling and tightness, L>R which is chronic - started on Monday. Compliant with bumex - some fatigue, no specific SOB. Sedentary lifestyle.   Past Medical History  Diagnosis Date  . Atrial fibrillation ([place])   . Atrial flutter ([place])   . Coronary atherosclerosis of native coronary artery     BMS to circumflex 9/08  . Type 2 diabetes mellitus ([place])   . Hyperlipidemia   . Multiple thyroid nodules   . Tachy-brady syndrome (H[facility])     PPM - Medtronic     Allergies  Allergen Reactions  . Codeine     REACTION: vomiting  . Lisinopril     Cough   . Metformin     REACTION: rash     Current Outpatient Prescriptions  Medication Sig Dispense Refill  . bumetanide (BUMEX) 2 MG tablet Take 1 mg by mouth daily.    . Canagliflozin (INVOKANA) 300 MG TABS Take 1 tablet by mouth daily.    Marland Kitchen diltiazem (CARDIZEM CD) 180 MG 24 hr capsule Take 1 capsule (180 mg total) by mouth 2 (two) times daily. 180 capsule 3  . dofetilide (TIKOSYN) 500 MCG capsule Take 1 capsule (500 mcg total) by mouth 2 (two) times daily. 180 capsule 3  . Elastic Bandages & Supports (MEDICAL COMPRESSION SOCKS) MISC 1 each by Does not apply route as directed. 1 PAIR OF KNEE HI MEDIUM COMPRESSION HOSE [id number] 1 each 0  . glipiZIDE (GLUCOTROL) 10 MG tablet Take 10 mg by mouth 2 (two) times daily before a meal.     . nitroGLYCERIN (NITROSTAT) 0.4 MG SL tablet Place 1 tablet (0.4 mg total) under the tongue every 5 (five) minutes x 3 doses as needed for chest pain (if no relief after 3 rd dose, proceed to ED for an evaluation). 75 tablet 1  . potassium chloride SA (K-DUR,KLOR-CON) 20 MEQ tablet Take 1 tablet (20 mEq  total) by mouth every other day. 45 tablet 3  . pravastatin (PRAVACHOL) 40 MG tablet Take 1 tablet (40 mg total) by mouth every evening. 90 tablet 3  . rivaroxaban (XARELTO) 20 MG TABS tablet Take 1 tablet (20 mg total) by mouth daily. 90 tablet 3  . saxagliptin HCl (ONGLYZA) 5 MG TABS tablet Take 5 mg by mouth daily.       No current facility-administered medications for this visit.     Past Surgical History  Procedure Laterality Date  . Ventral hernia repair    . Rotator cuff repair    . Vesicovaginal fistula closure w/ tah    . Breast lumpectomy    . Pacemaker placement      Medtronic     Allergies  Allergen Reactions  . Codeine     REACTION: vomiting  . Lisinopril     Cough   . Metformin     REACTION: rash      Family History  Problem Relation Age of Onset  . Heart disease Father   . Heart disease Other     one sibling     Social History Ms. Pritt reports that she has never smoked. She has never used smokeless tobacco. Ms. Westerkamp reports that she  does not drink alcohol.   Review of Systems CONSTITUTIONAL: No weight loss, fever, chills, weakness or fatigue.  HEENT: Eyes: No visual loss, blurred vision, double vision or yellow sclerae.No hearing loss, sneezing, congestion, runny nose or sore throat.  SKIN: No rash or itching.  CARDIOVASCULAR: no chest pain, no palpitations RESPIRATORY: No shortness of breath, cough or sputum.  GASTROINTESTINAL: No anorexia, nausea, vomiting or diarrhea. No abdominal pain or blood.  GENITOURINARY: No burning on urination, no polyuria NEUROLOGICAL: No headache, dizziness, syncope, paralysis, ataxia, numbness or tingling in the extremities. No change in bowel or bladder control.  MUSCULOSKELETAL: No muscle, back pain, joint pain or stiffness.  LYMPHATICS: No enlarged nodes. No history of splenectomy.  PSYCHIATRIC: No history of depression or anxiety.  ENDOCRINOLOGIC: No reports of sweating, cold or heat intolerance. No polyuria  or polydipsia.  Marland Kitchen   Physical Examination Filed Vitals:   08/09/15 1538  BP: 146/75  Pulse: 71   Filed Vitals:   08/09/15 1538  Height: 5\' 3"  (1.6 m)  Weight: 180 lb (81.647 kg)    Gen: resting comfortably, no acute distress HEENT: no scleral icterus, pupils equal round and reactive, no palptable cervical adenopathy,  CV: RRR, no m/r/g, no jvd Resp: Clear to auscultation bilaterally GI: abdomen is soft, non-tender, non-distended, normal bowel sounds, no hepatosplenomegaly MSK: extremities are warm, 1-2+ bilateral LE edema L>R Skin: warm, no rash Neuro:  no focal deficits Psych: appropriate affect      Assessment and Plan  1. LE edema - she would like to follow up with vascular surgery again, it has been a few years since she has seen them and she would like to consider a possible intervention for her venous insufficiency. Will place referral - she is not interested in compression stockings - counseled can take additional bumex to try to help with symptom as well      Arnoldo Lenis, M.D

## 2015-08-09 NOTE — Patient Instructions (Signed)
Your physician recommends that you schedule a follow-up appointment WITH DR. MCDOWELL AS SCHEDULED  Your physician has recommended you make the following change in your medication:   CHANGE BUMEX 1 MG TWICE DAILY FOR 3 DAYS THEN TAKE 1MG  DAILY  You have been referred to VASCULAR   Thank you for choosing [facility]!!

## 2015-08-12 ENCOUNTER — Telehealth: Payer: Self-pay | Admitting: Cardiology

## 2015-08-12 NOTE — Telephone Encounter (Signed)
Mrs. Qasem has questions about the medication BUMEX 1 MG TWICE DAILY FOR 3 DAYS THEN TAKE 1MG  DAILY She states that the swelling has gone done some.

## 2015-08-13 NOTE — Telephone Encounter (Signed)
Per patient, she spoke with [facility] today about this message.

## 2015-08-13 NOTE — Telephone Encounter (Signed)
Pt says she has taken Bumex 1 mg bid until yesterday 6/26 and leg swelling is better - pt will go back to 1 mg daily as instructed

## 2015-08-19 DIAGNOSIS — E1165 Type 2 diabetes mellitus with hyperglycemia: Secondary | ICD-10-CM | POA: Diagnosis not present

## 2015-08-19 DIAGNOSIS — K625 Hemorrhage of anus and rectum: Secondary | ICD-10-CM | POA: Diagnosis not present

## 2015-09-10 ENCOUNTER — Other Ambulatory Visit: Payer: Self-pay | Admitting: Vascular Surgery

## 2015-09-10 DIAGNOSIS — R609 Edema, unspecified: Secondary | ICD-10-CM

## 2015-09-18 NOTE — Progress Notes (Signed)
.    Cardiology Office Note  Date: 09/30/2015   ID: [name] [name], DOB 1945-02-24, MRN [id number]  PCP: [name] Labrum, MD  Primary Cardiologist: [name] Lesches, MD   Chief Complaint  Patient presents with  . Atrial Fibrillation    History of Present Illness: [name] [name] is a 70 y.o. female seen most recently in the office by Dr. Harl [name] in June for evaluation of leg edema. I reviewed his note, she was referred to vascular surgery for discussion of venous insufficiency. She has been using Bumex.  She describes no significant change in her leg edema, tends to be around her ankles and lower legs by the end of the day, relatively symmetrical. She reports a history of varicose veins. States that she was not able to tolerate lower leg compression stockings very well.  She continues to follow with Dr. Rayann [name] in the device clinic, Medtronic pacemaker in place. Last interrogation showed only 1.6% mode switches and no high ventricular rates. He has had no palpitations, denies any bleeding problems on Xarelto. She follows lab work with Dr. Pleas [name].  Past Medical History:  Diagnosis Date  . Atrial fibrillation ([name] [name])   . Atrial flutter ([name] [name])   . Coronary atherosclerosis of native coronary artery    BMS to circumflex 9/08  . Hyperlipidemia   . Multiple thyroid nodules   . 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 Type 2 diabetes mellitus ([name] [name])     Past Surgical History:  Procedure Laterality Date  . BREAST LUMPECTOMY    . PACEMAKER PLACEMENT     Medtronic  . ROTATOR CUFF REPAIR    . VENTRAL HERNIA REPAIR    . VESICOVAGINAL FISTULA CLOSURE W/ TAH      Current Outpatient Prescriptions  Medication Sig Dispense Refill  . bumetanide (BUMEX) 2 MG tablet Take 2 mg by mouth 2 (two) times daily.    . canagliflozin (INVOKANA) 300 MG TABS tablet Take 300 mg by mouth daily before breakfast.    . diltiazem (CARDIZEM CD) 180 MG 24 hr capsule Take 1 capsule (180 mg total) by mouth 2  (two) times daily. 180 capsule 3  . dofetilide (TIKOSYN) 500 MCG capsule Take 1 capsule (500 mcg total) by mouth 2 (two) times daily. 180 capsule 3  . Elastic Bandages & Supports (MEDICAL COMPRESSION SOCKS) MISC 1 each by Does not apply route as directed. 1 PAIR OF KNEE HI MEDIUM COMPRESSION HOSE [id number] 1 each 0  . glipiZIDE (GLUCOTROL) 10 MG tablet Take 10 mg by mouth 2 (two) times daily before a meal.    . nitroGLYCERIN (NITROSTAT) 0.4 MG SL tablet Place 1 tablet (0.4 mg total) under the tongue every 5 (five) minutes x 3 doses as needed for chest pain (if no relief after 3 rd dose, proceed to ED for an evaluation). 75 tablet 1  . potassium chloride SA (K-DUR,KLOR-CON) 20 MEQ tablet Take 1 tablet (20 mEq total) by mouth every other day. 45 tablet 3  . pravastatin (PRAVACHOL) 40 MG tablet Take 1 tablet (40 mg total) by mouth every evening. 90 tablet 3  . rivaroxaban (XARELTO) 20 MG TABS tablet Take 1 tablet (20 mg total) by mouth daily. 90 tablet 3  . saxagliptin HCl (ONGLYZA) 5 MG TABS tablet Take 5 mg by mouth daily.     No current facility-administered medications for this visit.    Allergies:  Codeine; Lisinopril; and Metformin and related   Social History: The patient  reports that  she has never smoked. She has never used smokeless tobacco. She reports that she does not drink alcohol.   ROS:  Please see the history of present illness. Otherwise, complete review of systems is positive for chronic lower leg edema.  All other systems are reviewed and negative.   Physical Exam: VS:  BP 130/70   Pulse 63   Ht 5\' 3"  (1.6 m)   Wt 172 lb (78 kg)   SpO2 95%   BMI 30.47 kg/m , BMI Body mass index is 30.47 kg/m.  Wt Readings from Last 3 Encounters:  09/30/15 172 lb (78 kg)  08/09/15 180 lb (81.6 kg)  06/21/15 177 lb 6.4 oz (80.5 kg)    Appears comfortable at rest.  HEENT: Conjunctiva and lids normal, oropharynx clear.  Neck: Supple, no elevated JVP or carotid bruits, no thyromegaly.   Lungs: Clear to auscultation, nonlabored breathing at rest.  Cardiac: Regular rate and rhythm, no S3 or significant systolic murmur, no pericardial rub.  Abdomen: Soft, nontender, bowel sounds present, no guarding or rebound.  Extremities: 1+ lower leg edema with associated skin changes, no ulcerations, distal pulses 2+.  Musculoskeletal: No kyphosis. Neuropsychiatric: Alert and oriented 3, affect appropriate.   ECG: I personally reviewed the tracing from 03/29/2015 which showed an atrial paced rhythm.  Recent Labwork:  May 2017: BUN 11, creatinine 0.6, potassium 4.2, magnesium 2.4, hemoglobin 16.0, platelets 182  Other Studies Reviewed Today:  Echocardiogram 10/19/2012: Study Conclusions  - Left ventricle: The cavity size was normal. There was mild concentric hypertrophy. Systolic function was normal. The estimated ejection fraction was in the range of 60% to 65%. Wall motion was normal; there were no regional wall motion abnormalities. Doppler parameters are consistent with abnormal left ventricular relaxation (grade 1 diastolic dysfunction). - Mitral valve: Calcified annulus. No significant regurgitation. - Left atrium: The atrium was at the upper limits of normal in size. - Right ventricle: Pacer wire or catheter noted in right ventricle. - Right atrium: Central venous pressure: 41mm Hg (est). - Tricuspid valve: Trivial regurgitation. - Pulmonary arteries: PA peak pressure: 2mm Hg (S). - Pericardium, extracardiac: A prominent pericardial fat pad was present. There was no pericardial effusion. Impressions:  - Unable to compare directly with prior study October 2011. Mild LVH with LVEF 123456, grade 1 diastolic dysfunction. Upper normal left atrial size. Device wire in right heart. PASP 33 mmHg.  Assessment and Plan:  1. Paroxysmal atrial fibrillation, symptomatic a well-controlled on current regimen including Cardizem CD, Tikosyn, and  Xarelto. Lab work from May showed normal creatinine, potassium, and magnesium.  2. Venous insufficiency with chronic leg edema, reported varicose veins. She has a pending visit scheduled by Dr. Harl [name] with [facility]. She is on Bumex diuretic treatment, reports having trouble wearing compression hose previously.  3. Hyperlipidemia, continue his on Pravachol with follow-up lab work per Dr. Pleas [name].  4. Tachycardia-bradycardia syndrome status post Medtronic pacemaker placement, followed by Dr. Rayann [name].  Current medicines were reviewed with the patient today.  Disposition: Follow-up with me in 6 months.  Signed, Satira Sark, MD, [facility] 09/30/2015 11:15 AM    [facility] [place], [place], [place] [address] Phone: [phone]; Fax: [phone]

## 2015-09-23 ENCOUNTER — Ambulatory Visit (INDEPENDENT_AMBULATORY_CARE_PROVIDER_SITE_OTHER): Payer: Medicare Other | Admitting: *Deleted

## 2015-09-23 ENCOUNTER — Telehealth: Payer: Self-pay | Admitting: Cardiology

## 2015-09-23 DIAGNOSIS — I495 Sick sinus syndrome: Secondary | ICD-10-CM | POA: Diagnosis not present

## 2015-09-23 NOTE — Progress Notes (Signed)
Remote pacemaker transmission.   

## 2015-09-23 NOTE — Telephone Encounter (Signed)
Spoke with pt and reminded pt of remote transmission that is due today. Pt verbalized understanding.   

## 2015-09-25 ENCOUNTER — Encounter: Payer: Self-pay | Admitting: Cardiology

## 2015-09-26 LAB — CUP PACEART REMOTE DEVICE CHECK
Battery Remaining Longevity: 85 mo
Battery Voltage: 2.79 V
Brady Statistic AP VP Percent: 0 %
Implantable Lead Implant Date: 20110510
Implantable Lead Implant Date: 20110510
Implantable Lead Location: 753859
Lead Channel Impedance Value: 546 Ohm
Lead Channel Pacing Threshold Amplitude: 1.125 V
Lead Channel Setting Pacing Amplitude: 2 V
Lead Channel Setting Pacing Amplitude: 2.5 V
Lead Channel Setting Pacing Pulse Width: 0.4 ms
MDC IDC LEAD LOCATION: 753860
MDC IDC MSMT BATTERY IMPEDANCE: 535 Ohm
MDC IDC MSMT LEADCHNL RA PACING THRESHOLD AMPLITUDE: 0.5 V
MDC IDC MSMT LEADCHNL RA PACING THRESHOLD PULSEWIDTH: 0.4 ms
MDC IDC MSMT LEADCHNL RV IMPEDANCE VALUE: 696 Ohm
MDC IDC MSMT LEADCHNL RV PACING THRESHOLD PULSEWIDTH: 0.4 ms
MDC IDC SESS DTM: 20170807171149
MDC IDC SET LEADCHNL RV SENSING SENSITIVITY: 2.8 mV
MDC IDC STAT BRADY AP VS PERCENT: 52 %
MDC IDC STAT BRADY AS VP PERCENT: 0 %
MDC IDC STAT BRADY AS VS PERCENT: 47 %

## 2015-09-30 ENCOUNTER — Ambulatory Visit (INDEPENDENT_AMBULATORY_CARE_PROVIDER_SITE_OTHER): Payer: Medicare Other | Admitting: Cardiology

## 2015-09-30 ENCOUNTER — Encounter: Payer: Self-pay | Admitting: Cardiology

## 2015-09-30 VITALS — BP 130/70 | HR 63 | Ht 63.0 in | Wt 172.0 lb

## 2015-09-30 DIAGNOSIS — I495 Sick sinus syndrome: Secondary | ICD-10-CM | POA: Diagnosis not present

## 2015-09-30 DIAGNOSIS — R600 Localized edema: Secondary | ICD-10-CM

## 2015-09-30 DIAGNOSIS — E785 Hyperlipidemia, unspecified: Secondary | ICD-10-CM

## 2015-09-30 DIAGNOSIS — I480 Paroxysmal atrial fibrillation: Secondary | ICD-10-CM | POA: Diagnosis not present

## 2015-09-30 MED ORDER — NITROGLYCERIN 0.4 MG SL SUBL: 0.4000 mg | SUBLINGUAL_TABLET | SUBLINGUAL | 1 refills | Status: DC | PRN

## 2015-09-30 NOTE — Patient Instructions (Signed)

## 2015-10-08 ENCOUNTER — Encounter: Payer: Self-pay | Admitting: Vascular Surgery

## 2015-10-08 DIAGNOSIS — I10 Essential (primary) hypertension: Secondary | ICD-10-CM | POA: Diagnosis not present

## 2015-10-08 DIAGNOSIS — E1165 Type 2 diabetes mellitus with hyperglycemia: Secondary | ICD-10-CM | POA: Diagnosis not present

## 2015-10-10 DIAGNOSIS — R32 Unspecified urinary incontinence: Secondary | ICD-10-CM | POA: Diagnosis not present

## 2015-10-10 DIAGNOSIS — M545 Low back pain: Secondary | ICD-10-CM | POA: Diagnosis not present

## 2015-10-10 DIAGNOSIS — E1165 Type 2 diabetes mellitus with hyperglycemia: Secondary | ICD-10-CM | POA: Diagnosis not present

## 2015-10-10 DIAGNOSIS — I480 Paroxysmal atrial fibrillation: Secondary | ICD-10-CM | POA: Diagnosis not present

## 2015-10-10 DIAGNOSIS — E782 Mixed hyperlipidemia: Secondary | ICD-10-CM | POA: Diagnosis not present

## 2015-10-10 DIAGNOSIS — I10 Essential (primary) hypertension: Secondary | ICD-10-CM | POA: Diagnosis not present

## 2015-10-11 ENCOUNTER — Encounter: Payer: Medicare Other | Admitting: Vascular Surgery

## 2015-10-11 ENCOUNTER — Ambulatory Visit (HOSPITAL_COMMUNITY): Payer: Medicare Other

## 2015-12-03 ENCOUNTER — Other Ambulatory Visit: Payer: Self-pay | Admitting: Cardiology

## 2015-12-06 ENCOUNTER — Encounter (HOSPITAL_COMMUNITY): Payer: Medicare Other

## 2015-12-06 ENCOUNTER — Encounter: Payer: Medicare Other | Admitting: Vascular Surgery

## 2015-12-19 DIAGNOSIS — Z23 Encounter for immunization: Secondary | ICD-10-CM | POA: Diagnosis not present

## 2015-12-20 ENCOUNTER — Encounter: Payer: Medicare Other | Admitting: Vascular Surgery

## 2015-12-20 ENCOUNTER — Encounter (HOSPITAL_COMMUNITY): Payer: Medicare Other

## 2015-12-23 ENCOUNTER — Ambulatory Visit (INDEPENDENT_AMBULATORY_CARE_PROVIDER_SITE_OTHER): Payer: Medicare Other | Admitting: *Deleted

## 2015-12-23 DIAGNOSIS — I495 Sick sinus syndrome: Secondary | ICD-10-CM

## 2015-12-23 DIAGNOSIS — R3915 Urgency of urination: Secondary | ICD-10-CM | POA: Diagnosis not present

## 2015-12-23 NOTE — Progress Notes (Signed)
Remote pacemaker transmission.   

## 2015-12-25 ENCOUNTER — Encounter: Payer: Self-pay | Admitting: Cardiology

## 2016-01-05 LAB — CUP PACEART REMOTE DEVICE CHECK
Battery Impedance: 560 Ohm
Battery Voltage: 2.79 V
Brady Statistic AP VS Percent: 58 %
Brady Statistic AS VP Percent: 0 %
Brady Statistic AS VS Percent: 42 %
Date Time Interrogation Session: 20171106185058
Implantable Lead Implant Date: 20110510
Implantable Lead Location: 753859
Implantable Lead Location: 753860
Implantable Lead Model: 5076
Implantable Lead Model: 5092
Lead Channel Impedance Value: 592 Ohm
Lead Channel Pacing Threshold Amplitude: 0.625 V
Lead Channel Pacing Threshold Pulse Width: 0.4 ms
Lead Channel Setting Pacing Amplitude: 2.75 V
MDC IDC LEAD IMPLANT DT: 20110510
MDC IDC MSMT BATTERY REMAINING LONGEVITY: 89 mo
MDC IDC MSMT LEADCHNL RA IMPEDANCE VALUE: 539 Ohm
MDC IDC MSMT LEADCHNL RV PACING THRESHOLD AMPLITUDE: 1.375 V
MDC IDC MSMT LEADCHNL RV PACING THRESHOLD PULSEWIDTH: 0.4 ms
MDC IDC PG IMPLANT DT: 20110510
MDC IDC SET LEADCHNL RA PACING AMPLITUDE: 2 V
MDC IDC SET LEADCHNL RV PACING PULSEWIDTH: 0.4 ms
MDC IDC SET LEADCHNL RV SENSING SENSITIVITY: 2.8 mV
MDC IDC STAT BRADY AP VP PERCENT: 0 %

## 2016-02-11 ENCOUNTER — Other Ambulatory Visit: Payer: Self-pay | Admitting: Cardiology

## 2016-02-11 ENCOUNTER — Other Ambulatory Visit: Payer: Self-pay | Admitting: Internal Medicine

## 2016-02-12 DIAGNOSIS — I480 Paroxysmal atrial fibrillation: Secondary | ICD-10-CM | POA: Diagnosis not present

## 2016-02-12 DIAGNOSIS — M545 Low back pain: Secondary | ICD-10-CM | POA: Diagnosis not present

## 2016-02-12 DIAGNOSIS — I10 Essential (primary) hypertension: Secondary | ICD-10-CM | POA: Diagnosis not present

## 2016-02-12 DIAGNOSIS — E782 Mixed hyperlipidemia: Secondary | ICD-10-CM | POA: Diagnosis not present

## 2016-02-12 DIAGNOSIS — E1165 Type 2 diabetes mellitus with hyperglycemia: Secondary | ICD-10-CM | POA: Diagnosis not present

## 2016-02-13 ENCOUNTER — Telehealth: Payer: Self-pay | Admitting: Internal Medicine

## 2016-02-13 ENCOUNTER — Other Ambulatory Visit: Payer: Self-pay | Admitting: *Deleted

## 2016-02-13 MED ORDER — NITROGLYCERIN 0.4 MG SL SUBL: 0.4000 mg | SUBLINGUAL_TABLET | SUBLINGUAL | 3 refills | Status: AC | PRN

## 2016-02-13 NOTE — Telephone Encounter (Signed)
Patient would like to know if she can use AiR FRYER she got for Christmas

## 2016-02-13 NOTE — Telephone Encounter (Signed)
Pt says she contacted Child psychotherapist with having a device who suggested she contact her cardiologist. Will forward to device clinic

## 2016-02-13 NOTE — Telephone Encounter (Signed)
Called pt back and let her know that it is safe to use an air fryer.

## 2016-03-09 DIAGNOSIS — E782 Mixed hyperlipidemia: Secondary | ICD-10-CM | POA: Diagnosis not present

## 2016-03-09 DIAGNOSIS — E1165 Type 2 diabetes mellitus with hyperglycemia: Secondary | ICD-10-CM | POA: Diagnosis not present

## 2016-03-09 DIAGNOSIS — I10 Essential (primary) hypertension: Secondary | ICD-10-CM | POA: Diagnosis not present

## 2016-03-13 ENCOUNTER — Other Ambulatory Visit: Payer: Self-pay | Admitting: Cardiology

## 2016-03-16 DIAGNOSIS — R221 Localized swelling, mass and lump, neck: Secondary | ICD-10-CM | POA: Diagnosis not present

## 2016-03-19 DIAGNOSIS — E042 Nontoxic multinodular goiter: Secondary | ICD-10-CM | POA: Diagnosis not present

## 2016-03-23 ENCOUNTER — Ambulatory Visit (INDEPENDENT_AMBULATORY_CARE_PROVIDER_SITE_OTHER): Payer: Medicare Other | Admitting: *Deleted

## 2016-03-23 DIAGNOSIS — I495 Sick sinus syndrome: Secondary | ICD-10-CM

## 2016-03-24 LAB — CUP PACEART REMOTE DEVICE CHECK
Battery Impedance: 636 Ohm
Battery Voltage: 2.79 V
Brady Statistic AP VS Percent: 60 %
Brady Statistic AS VP Percent: 0 %
Implantable Lead Location: 753859
Implantable Lead Location: 753860
Implantable Lead Model: 5076
Implantable Lead Model: 5092
Lead Channel Pacing Threshold Amplitude: 0.625 V
Lead Channel Pacing Threshold Pulse Width: 0.4 ms
Lead Channel Setting Pacing Amplitude: 2.5 V
Lead Channel Setting Pacing Pulse Width: 0.4 ms
MDC IDC LEAD IMPLANT DT: 20110510
MDC IDC LEAD IMPLANT DT: 20110510
MDC IDC MSMT BATTERY REMAINING LONGEVITY: 77 mo
MDC IDC MSMT LEADCHNL RA IMPEDANCE VALUE: 571 Ohm
MDC IDC MSMT LEADCHNL RV IMPEDANCE VALUE: 672 Ohm
MDC IDC MSMT LEADCHNL RV PACING THRESHOLD AMPLITUDE: 1.125 V
MDC IDC MSMT LEADCHNL RV PACING THRESHOLD PULSEWIDTH: 0.4 ms
MDC IDC PG IMPLANT DT: 20110510
MDC IDC SESS DTM: 20180205150247
MDC IDC SET LEADCHNL RA PACING AMPLITUDE: 2 V
MDC IDC SET LEADCHNL RV SENSING SENSITIVITY: 2.8 mV
MDC IDC STAT BRADY AP VP PERCENT: 0 %
MDC IDC STAT BRADY AS VS PERCENT: 40 %

## 2016-03-24 NOTE — Progress Notes (Signed)
Remote pacemaker transmission.   

## 2016-03-25 ENCOUNTER — Encounter: Payer: Self-pay | Admitting: Cardiology

## 2016-04-07 NOTE — Progress Notes (Signed)
Cardiology Office Note  Date: 04/08/2016   ID: [name] [name], DOB 27-Dec-1945, MRN OR:[id number]  PCP: Curlene Labrum, MD  Primary Cardiologist: Rozann Lesches, MD   Chief Complaint  Patient presents with  . PAF    History of Present Illness: [name] [name] is a 71 y.o. female last seen in August 2017. She presents for a routine follow-up visit. Reports no chest pain or palpitations since last encounter.  She continues to follow with [name] in the device clinic, Medtronic pacemaker in place. Most recent device check revealed 3% atrial fibrillation burden.  I reviewed her current medications. Cardiac regimen includes Xarelto, Tikosyn, and Cardizem CD. She is also on Bumex with potassium supplements. We discussed obtaining follow-up lab work.  I reviewed her ECG today which shows sinus rhythm with nonspecific ST-T changes, normal QTc.  Past Medical History:  Diagnosis Date  . Atrial fibrillation ([place])   . Atrial flutter ([place])   . Coronary atherosclerosis of native coronary artery    BMS to circumflex 9/08  . Hyperlipidemia   . Multiple thyroid nodules   . 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 Type 2 diabetes mellitus ([place])     Past Surgical History:  Procedure Laterality Date  . BREAST LUMPECTOMY    . PACEMAKER PLACEMENT     Medtronic  . ROTATOR CUFF REPAIR    . VENTRAL HERNIA REPAIR    . VESICOVAGINAL FISTULA CLOSURE W/ TAH      Current Outpatient Prescriptions  Medication Sig Dispense Refill  . bumetanide (BUMEX) 2 MG tablet Take 2 mg by mouth 2 (two) times daily.    . canagliflozin (INVOKANA) 300 MG TABS tablet Take 300 mg by mouth daily before breakfast.    . diltiazem (CARDIZEM CD) 180 MG 24 hr capsule TAKE 1 CAPSULE BY MOUTH TWO TIMES DAILY 180 capsule 3  . dofetilide (TIKOSYN) 500 MCG capsule TAKE 1 CAPSULE BY MOUTH TWO TIMES DAILY 180 capsule 0  . glipiZIDE (GLUCOTROL) 10 MG tablet Take 10 mg by mouth 2 (two) times daily before a meal.    .  nitroGLYCERIN (NITROSTAT) 0.4 MG SL tablet Place 1 tablet (0.4 mg total) under the tongue every 5 (five) minutes x 3 doses as needed for chest pain (if no relief after 3 rd dose, proceed to ED for an evaluation). 75 tablet 3  . potassium chloride SA (K-DUR,KLOR-CON) 20 MEQ tablet TAKE 1 TABLET BY MOUTH  EVERY OTHER DAY 45 tablet 3  . pravastatin (PRAVACHOL) 40 MG tablet TAKE 1 TABLET BY MOUTH  EVERY EVENING 90 tablet 3  . saxagliptin HCl (ONGLYZA) 5 MG TABS tablet Take 5 mg by mouth daily.    [name] 20 MG TABS tablet TAKE 1 TABLET BY MOUTH  DAILY 90 tablet 3   No current facility-administered medications for this visit.    Allergies:  Codeine; Lisinopril; and Metformin and related   Social History: The patient  reports that she has never smoked. She has never used smokeless tobacco. She reports that she does not drink alcohol.   ROS:  Please see the history of present illness. Otherwise, complete review of systems is positive for ongoing workup of thyroid nodule.  All other systems are reviewed and negative.   Physical Exam: VS:  BP 125/75   Pulse 74   Ht 5\' 3"  (1.6 m)   Wt 180 lb 12.8 oz (82 kg)   SpO2 94%   BMI 32.03 kg/m , BMI  Body mass index is 32.03 kg/m.  Wt Readings from Last 3 Encounters:  04/08/16 180 lb 12.8 oz (82 kg)  09/30/15 172 lb (78 kg)  08/09/15 180 lb (81.6 kg)    Appears comfortable at rest.  HEENT: Conjunctiva and lids normal, oropharynx clear.  Neck: Supple, no elevated JVP or carotid bruits, no thyromegaly.  Lungs: Clear to auscultation, nonlabored breathing at rest.  Cardiac: Regular rate and rhythm, no S3 or significant systolic murmur, no pericardial rub.  Abdomen: Soft, nontender, bowel sounds present, no guarding or rebound.  Extremities: 1+ lower leg edema with associated skin changes, no ulcerations, distal pulses 2+.  Musculoskeletal: No kyphosis. Neuropsychiatric: Alert and oriented 3, affect appropriate.  ECG: I personally reviewed the  tracing from 03/29/2015 which showed an atrial paced rhythm with nonspecific T-wave changes.  Recent Labwork:  May 2017: BUN 11, creatinine 0.6, potassium 4.2, magnesium 2.4, hemoglobin 16.0, platelets 182  Other Studies Reviewed Today:  Echocardiogram 10/19/2012: Study Conclusions  - Left ventricle: The cavity size was normal. There was mild concentric hypertrophy. Systolic function was normal. The estimated ejection fraction was in the range of 60% to 65%. Wall motion was normal; there were no regional wall motion abnormalities. Doppler parameters are consistent with abnormal left ventricular relaxation (grade 1 diastolic dysfunction). - Mitral valve: Calcified annulus. No significant regurgitation. - Left atrium: The atrium was at the upper limits of normal in size. - Right ventricle: Pacer wire or catheter noted in right ventricle. - Right atrium: Central venous pressure: 61mm Hg (est). - Tricuspid valve: Trivial regurgitation. - Pulmonary arteries: PA peak pressure: 61mm Hg (S). - Pericardium, extracardiac: A prominent pericardial fat pad was present. There was no pericardial effusion. Impressions:  - Unable to compare directly with prior study October 2011. Mild LVH with LVEF [id number], grade 1 diastolic dysfunction. Upper normal left atrial size. Device wire in right heart. PASP 33 mmHg.  Assessment and Plan:  1. Paroxysmal atrial fibrillation, well-controlled on current regimen. Continue Cardizem CD, Xarelto, and Tikosyn. Follow-up magnesium, CBC, and BMET.  2. History of leg swelling and venous insufficiency, currently stable on diuretic regimen.  3. Hyperlipidemia, on Pravachol. She continues to follow with Dr. Pleas Koch.  4. 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 Keep follow-up with [name].  Current medicines were reviewed with the patient today.   Orders Placed This Encounter  Procedures  . Basic metabolic  panel  . CBC  . Magnesium  . EKG 12-Lead    Disposition: Follow-up in 6 months.  Signed, Satira Sark, MD, [facility] 04/08/2016 11:27 AM    [facility] [place], [place], [place] [address] Phone: [phone]; Fax: [phone]

## 2016-04-08 ENCOUNTER — Encounter: Payer: Self-pay | Admitting: Cardiology

## 2016-04-08 ENCOUNTER — Ambulatory Visit (INDEPENDENT_AMBULATORY_CARE_PROVIDER_SITE_OTHER): Payer: Medicare Other | Admitting: Cardiology

## 2016-04-08 VITALS — BP 125/75 | HR 74 | Ht 63.0 in | Wt 180.8 lb

## 2016-04-08 DIAGNOSIS — I480 Paroxysmal atrial fibrillation: Secondary | ICD-10-CM

## 2016-04-08 DIAGNOSIS — E782 Mixed hyperlipidemia: Secondary | ICD-10-CM

## 2016-04-08 DIAGNOSIS — Z7901 Long term (current) use of anticoagulants: Secondary | ICD-10-CM

## 2016-04-08 DIAGNOSIS — R600 Localized edema: Secondary | ICD-10-CM | POA: Diagnosis not present

## 2016-04-08 DIAGNOSIS — I495 Sick sinus syndrome: Secondary | ICD-10-CM

## 2016-04-08 NOTE — Patient Instructions (Signed)
Your physician wants you to follow-up in: 6 months with Dr. Domenic Polite. You will receive a reminder letter in the mail two months in advance. If you don't receive a letter, please call our office to schedule the follow-up appointment.   Your physician recommends that you return for lab work in:  BMET [place] physician recommends that you continue on your current medications as directed. Please refer to the Current Medication list given to you today.    Thank you for choosing [facility]!!

## 2016-04-13 ENCOUNTER — Other Ambulatory Visit (INDEPENDENT_AMBULATORY_CARE_PROVIDER_SITE_OTHER): Payer: Self-pay | Admitting: Otolaryngology

## 2016-04-13 ENCOUNTER — Ambulatory Visit (INDEPENDENT_AMBULATORY_CARE_PROVIDER_SITE_OTHER): Payer: Medicare Other | Admitting: Otolaryngology

## 2016-04-13 DIAGNOSIS — D440 Neoplasm of uncertain behavior of thyroid gland: Secondary | ICD-10-CM

## 2016-04-13 DIAGNOSIS — E041 Nontoxic single thyroid nodule: Secondary | ICD-10-CM

## 2016-04-14 ENCOUNTER — Telehealth: Payer: Self-pay | Admitting: *Deleted

## 2016-04-14 DIAGNOSIS — I480 Paroxysmal atrial fibrillation: Secondary | ICD-10-CM | POA: Diagnosis not present

## 2016-04-14 DIAGNOSIS — Z7901 Long term (current) use of anticoagulants: Secondary | ICD-10-CM | POA: Diagnosis not present

## 2016-04-14 NOTE — Telephone Encounter (Signed)
Notes Recorded by Laurine Blazer, LPN on [id number] at 624THL PM EST Patient notified. Copy to pmd. ------  Notes Recorded by Satira Sark, MD on 04/14/2016 at 1:53 PM EST Results reviewed. Lab work is stable with normal electrolytes and renal function. A copy of this test should be forwarded to Curlene Labrum, MD.

## 2016-04-23 ENCOUNTER — Ambulatory Visit (HOSPITAL_COMMUNITY)
Admission: RE | Admit: 2016-04-23 | Discharge: 2016-04-23 | Disposition: A | Discharge status: Home / Self Care | Payer: Medicare Other | Source: Ambulatory Visit | Attending: Otolaryngology | Admitting: Otolaryngology

## 2016-04-23 ENCOUNTER — Encounter (HOSPITAL_COMMUNITY): Payer: Self-pay

## 2016-04-23 DIAGNOSIS — E041 Nontoxic single thyroid nodule: Secondary | ICD-10-CM

## 2016-04-23 MED ORDER — LIDOCAINE HCL (PF) 2 % IJ SOLN
INTRAMUSCULAR | Status: AC
  Administered 2016-04-23: 10 mL
  Filled 2016-04-23: qty 10

## 2016-04-23 MED ORDER — LIDOCAINE HCL 2 % IJ SOLN: 10.0000 mL | Freq: Once | INTRAMUSCULAR | Status: DC

## 2016-04-23 NOTE — Sedation Documentation (Signed)
Patient denies pain and is resting comfortably.  

## 2016-04-23 NOTE — Sedation Documentation (Signed)
Vital signs stable. 

## 2016-04-23 NOTE — Discharge Instructions (Signed)
Thyroid Biopsy °The thyroid gland is a butterfly-shaped gland located in the front of the neck. It produces hormones that affect metabolism, growth and development, and body temperature. Thyroid biopsy is a procedure in which small samples of tissue or fluid are removed from the thyroid gland. The samples are then looked at under a microscope to check for abnormalities. This procedure is done to determine the cause of thyroid problems. It may be done to check for infection, cancer, or other thyroid problems. °Two methods may be used for a thyroid biopsy. In one method, a thin needle is inserted through the skin and into the thyroid gland. In the other method, an open incision is made through the skin. °Tell a health care provider about: °· Any allergies you have. °· All medicines you are taking, including vitamins, herbs, eye drops, creams, and over-the-counter medicines. °· Any problems you or family members have had with anesthetic medicines. °· Any blood disorders you have. °· Any surgeries you have had. °· Any medical conditions you have. °What are the risks? °Generally, this is a safe procedure. However, problems can occur and include: °· Bleeding from the procedure site. °· Infection. °· Injury to structures near the thyroid gland. °What happens before the procedure? °· Ask your health care provider about: °¨ Changing or stopping your regular medicines. This is especially important if you are taking diabetes medicines or blood thinners. °¨ Taking medicines such as aspirin and ibuprofen. These medicines can thin your blood. Do not take these medicines before your procedure if your health care provider asks you not to. °· Do not eat or drink anything after midnight on the night before the procedure or as directed by your health care provider. °· You may have a blood sample taken. °What happens during the procedure? °Either of these methods may be used to perform a thyroid biopsy: °· Fine needle biopsy. You may  be given medicine to help you relax (sedative). You will be asked to lie on your back with your head tipped backward to extend your neck. An area on your neck will be cleaned. A needle will then be inserted through the skin of your neck. You may be asked to avoid coughing, talking, swallowing, or making sounds during some portions of the procedure. The needle will be withdrawn once the tissue or fluid samples have been removed. Pressure may be applied to your neck to reduce swelling and ensure that bleeding has stopped. The samples will be sent to a lab for examination. °· Open biopsy. You will be given medicine to make you sleep (general anesthetic). An incision will be made in your neck. A sample of thyroid tissue will be removed using surgical tools. The tissue sample will be sent for examination. In some cases, the sample may be examined during the biopsy. If that is done and cancer cells are found, some or all of the thyroid gland may be removed. The incision will be closed with stitches. °What happens after the procedure? °· Your recovery will be assessed and monitored. °· You may have soreness and tenderness at the site of the biopsy. This should go away after a few days. °· If you had an open biopsy, you may have a hoarse voice or sore throat for a couple days. °· It is your responsibility to get your test results. °This information is not intended to replace advice given to you by your health care provider. Make sure you discuss any questions you have with your health   care provider. °Document Released: 11/30/2006 Document Revised: 10/06/2015 Document Reviewed: 04/27/2013 °Elsevier Interactive Patient Education © 2017 Elsevier Inc. ° °

## 2016-06-03 ENCOUNTER — Other Ambulatory Visit: Payer: Self-pay | Admitting: Internal Medicine

## 2016-06-08 DIAGNOSIS — I10 Essential (primary) hypertension: Secondary | ICD-10-CM | POA: Diagnosis not present

## 2016-06-08 DIAGNOSIS — M545 Low back pain: Secondary | ICD-10-CM | POA: Diagnosis not present

## 2016-06-08 DIAGNOSIS — I480 Paroxysmal atrial fibrillation: Secondary | ICD-10-CM | POA: Diagnosis not present

## 2016-06-08 DIAGNOSIS — Z1389 Encounter for screening for other disorder: Secondary | ICD-10-CM | POA: Diagnosis not present

## 2016-06-08 DIAGNOSIS — E1165 Type 2 diabetes mellitus with hyperglycemia: Secondary | ICD-10-CM | POA: Diagnosis not present

## 2016-06-08 DIAGNOSIS — E782 Mixed hyperlipidemia: Secondary | ICD-10-CM | POA: Diagnosis not present

## 2016-06-09 ENCOUNTER — Ambulatory Visit (INDEPENDENT_AMBULATORY_CARE_PROVIDER_SITE_OTHER): Payer: Medicare Other | Admitting: Internal Medicine

## 2016-06-09 ENCOUNTER — Encounter: Payer: Self-pay | Admitting: Internal Medicine

## 2016-06-09 VITALS — BP 127/75 | HR 65 | Ht 63.0 in | Wt 184.0 lb

## 2016-06-09 DIAGNOSIS — I495 Sick sinus syndrome: Secondary | ICD-10-CM

## 2016-06-09 DIAGNOSIS — I480 Paroxysmal atrial fibrillation: Secondary | ICD-10-CM

## 2016-06-09 DIAGNOSIS — I10 Essential (primary) hypertension: Secondary | ICD-10-CM | POA: Diagnosis not present

## 2016-06-09 DIAGNOSIS — E1165 Type 2 diabetes mellitus with hyperglycemia: Secondary | ICD-10-CM | POA: Diagnosis not present

## 2016-06-09 LAB — CUP PACEART INCLINIC DEVICE CHECK
Battery Impedance: 686 Ohm
Battery Remaining Longevity: 74 mo
Battery Voltage: 2.79 V
Brady Statistic AP VP Percent: 0 %
Brady Statistic AS VP Percent: 0 %
Implantable Lead Location: 753860
Implantable Lead Model: 5076
Implantable Lead Model: 5092
Implantable Pulse Generator Implant Date: 20110510
Lead Channel Impedance Value: 593 Ohm
Lead Channel Pacing Threshold Amplitude: 0.625 V
Lead Channel Pacing Threshold Amplitude: 1.25 V
Lead Channel Pacing Threshold Pulse Width: 0.4 ms
Lead Channel Pacing Threshold Pulse Width: 0.4 ms
Lead Channel Sensing Intrinsic Amplitude: 8 mV
MDC IDC LEAD IMPLANT DT: 20110510
MDC IDC LEAD IMPLANT DT: 20110510
MDC IDC LEAD LOCATION: 753859
MDC IDC MSMT LEADCHNL RA IMPEDANCE VALUE: 581 Ohm
MDC IDC MSMT LEADCHNL RA PACING THRESHOLD AMPLITUDE: 0.5 V
MDC IDC MSMT LEADCHNL RA SENSING INTR AMPL: 4 mV
MDC IDC MSMT LEADCHNL RV PACING THRESHOLD AMPLITUDE: 1 V
MDC IDC MSMT LEADCHNL RV PACING THRESHOLD PULSEWIDTH: 0.4 ms
MDC IDC MSMT LEADCHNL RV PACING THRESHOLD PULSEWIDTH: 0.4 ms
MDC IDC SESS DTM: 20180424130019
MDC IDC SET LEADCHNL RA PACING AMPLITUDE: 2 V
MDC IDC SET LEADCHNL RV PACING AMPLITUDE: 2.5 V
MDC IDC SET LEADCHNL RV PACING PULSEWIDTH: 0.4 ms
MDC IDC SET LEADCHNL RV SENSING SENSITIVITY: 2.8 mV
MDC IDC STAT BRADY AP VS PERCENT: 62 %
MDC IDC STAT BRADY AS VS PERCENT: 38 %

## 2016-06-09 NOTE — Patient Instructions (Signed)
Medication Instructions:  Continue all current medications.  Labwork: none  Testing/Procedures: none  Follow-Up: Your physician wants you to follow up in:  1 year.  You will receive a reminder letter in the mail one-two months in advance.  If you don't receive a letter, please call our office to schedule the follow up appointment - Dr. Allred.   Any Other Special Instructions Will Be Listed Below (If Applicable). Remote monitoring is used to monitor your Pacemaker of ICD from home. This monitoring reduces the number of office visits required to check your device to one time per year. It allows us to keep an eye on the functioning of your device to ensure it is working properly. You are scheduled for a device check from home on 09/08/2016.  You may send your transmission at any time that day. If you have a wireless device, the transmission will be sent automatically. After your physician reviews your transmission, you will receive a postcard with your next transmission date.  If you need a refill on your cardiac medications before your next appointment, please call your pharmacy.  

## 2016-06-09 NOTE — Progress Notes (Signed)
PCP: Curlene Labrum, MD Primary Cardiologist:  Dr Domenic Polite CC: Afib  The patient presents today for routine electrophysiology followup.  Since last being seen in our clinic, the patient reports doing very well.  She is unaware of any symptoms of afib.  Her edema is much improved.  Her sister recently died and she is grieving.  Her daughter is also in rehab.  Today, she denies symptoms of palpitations, chest pain, shortness of breath, orthopnea, PND, presyncope, syncope, or neurologic sequela.  The patient feels that she is tolerating medications without difficulties and is otherwise without complaint today.   Past Medical History:  Diagnosis Date  . Atrial fibrillation ([place])   . Atrial flutter ([place])   . Coronary atherosclerosis of native coronary artery    BMS to circumflex 9/08  . Hyperlipidemia   . Multiple thyroid nodules   . 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 Type 2 diabetes mellitus ([place])    Past Surgical History:  Procedure Laterality Date  . BREAST LUMPECTOMY    . PACEMAKER PLACEMENT     Medtronic  . ROTATOR CUFF REPAIR    . VENTRAL HERNIA REPAIR    . VESICOVAGINAL FISTULA CLOSURE W/ TAH      Current Outpatient Prescriptions  Medication Sig Dispense Refill  . bumetanide (BUMEX) 2 MG tablet Take 2 mg by mouth 2 (two) times daily.    . canagliflozin (INVOKANA) 300 MG TABS tablet Take 300 mg by mouth daily before breakfast.    . diltiazem (CARDIZEM CD) 180 MG 24 hr capsule TAKE 1 CAPSULE BY MOUTH TWO TIMES DAILY 180 capsule 3  . dofetilide (TIKOSYN) 500 MCG capsule TAKE 1 CAPSULE BY MOUTH TWO TIMES DAILY 180 capsule 0  . glipiZIDE (GLUCOTROL) 10 MG tablet Take 10 mg by mouth 2 (two) times daily before a meal.    . nitroGLYCERIN (NITROSTAT) 0.4 MG SL tablet Place 1 tablet (0.4 mg total) under the tongue every 5 (five) minutes x 3 doses as needed for chest pain (if no relief after 3 rd dose, proceed to ED for an evaluation). 75 tablet 3  . potassium chloride  SA (K-DUR,KLOR-CON) 20 MEQ tablet TAKE 1 TABLET BY MOUTH  EVERY OTHER DAY 45 tablet 3  . pravastatin (PRAVACHOL) 40 MG tablet TAKE 1 TABLET BY MOUTH  EVERY EVENING 90 tablet 3  . saxagliptin HCl (ONGLYZA) 5 MG TABS tablet Take 5 mg by mouth daily.    [name] 20 MG TABS tablet TAKE 1 TABLET BY MOUTH  DAILY 90 tablet 3   No current facility-administered medications for this visit.     Allergies  Allergen Reactions  . Codeine     REACTION: vomiting  . Lisinopril     Cough   . Metformin And Related     rash    Social History   Social History  . Marital status: Married    Spouse name: N/A  . Number of children: N/A  . Years of education: N/A   Occupational History  . Not on file.   Social History Main Topics  . Smoking status: Never Smoker  . Smokeless tobacco: Never Used  . Alcohol use No  . Drug use: Unknown  . Sexual activity: Not on file   Other Topics Concern  . Not on file   Social History Narrative  . No narrative on file    Physical Exam: Vitals:   06/09/16 0829  Weight: 184 lb (83.5 kg)  Height: 5'  3" (1.6 m)    GEN- The patient is well appearing, alert and oriented x 3 today.   Head- normocephalic, atraumatic Eyes-  Sclera clear, conjunctiva pink Ears- hearing intact Oropharynx- clear Neck- supple,   Lungs- Clear to ausculation bilaterally, normal work of breathing Chest- pacemaker pocket is well healed Heart- Regular rate and rhythm  GI- soft, NT, ND, + BS Extremities- no clubbing, cyanosis, no edema Neuro- strength and sensation are intact  Pacemaker interrogation- personally reviewed in detail today,  See PACEART report ekg tracing from today is personally reviewed and reveals atrial pacing, Qtc 420 msec, nonspecific ST/T changes   Assessment and Plan:  1. afib Improved (afib burden 3% by PPM interrogation today, --> 3% 2007,  4% 2016, 15% 2015) Rate control during afib is stable.  As she is asymptomatic, I have made no changes  today If she develops symptoms with afib progression then I would advise ablation at that time.  She is clear that she wishes to avoid this. Labs from 2/18 are reviewed, QT is stable  2. Tachy/brady Normal pacemaker function See Pace Art report No changes today  3. LLE (venous insuffiencey) Much improved  carelink every 3 months Follow-up with Dr Domenic Polite as scheduled twice per year I will see again in a year  Thompson Grayer MD, [facility] 06/09/2016 8:34 AM

## 2016-06-19 ENCOUNTER — Encounter: Payer: Medicare Other | Admitting: Internal Medicine

## 2016-07-10 DIAGNOSIS — J029 Acute pharyngitis, unspecified: Secondary | ICD-10-CM | POA: Diagnosis not present

## 2016-07-10 DIAGNOSIS — M545 Low back pain: Secondary | ICD-10-CM | POA: Diagnosis not present

## 2016-07-28 DIAGNOSIS — M545 Low back pain: Secondary | ICD-10-CM | POA: Diagnosis not present

## 2016-08-31 ENCOUNTER — Other Ambulatory Visit: Payer: Self-pay | Admitting: Internal Medicine

## 2016-09-03 DIAGNOSIS — M545 Low back pain: Secondary | ICD-10-CM | POA: Diagnosis not present

## 2016-09-03 DIAGNOSIS — M5489 Other dorsalgia: Secondary | ICD-10-CM | POA: Diagnosis not present

## 2016-09-03 DIAGNOSIS — M799 Soft tissue disorder, unspecified: Secondary | ICD-10-CM | POA: Diagnosis not present

## 2016-09-03 DIAGNOSIS — M549 Dorsalgia, unspecified: Secondary | ICD-10-CM | POA: Diagnosis not present

## 2016-09-07 ENCOUNTER — Telehealth: Payer: Self-pay | Admitting: Cardiology

## 2016-09-07 NOTE — Telephone Encounter (Signed)
Mrs. Sulak called stating that she needs to have a procedure with [facility] States that we are suppose to receive a note from the [facility] letting us know what the procedure is.   Anderson Malta from [place] [phone].

## 2016-09-07 NOTE — Telephone Encounter (Signed)
Fax given to Dr. Domenic Polite and response faxed to the hand specialist.

## 2016-09-08 ENCOUNTER — Telehealth: Payer: Self-pay | Admitting: Cardiology

## 2016-09-08 ENCOUNTER — Encounter: Payer: Medicare Other | Admitting: *Deleted

## 2016-09-08 NOTE — Telephone Encounter (Signed)
LMOVM reminding pt to send remote transmission.   

## 2016-09-11 ENCOUNTER — Encounter: Payer: Self-pay | Admitting: Cardiology

## 2016-09-14 DIAGNOSIS — M5489 Other dorsalgia: Secondary | ICD-10-CM | POA: Diagnosis not present

## 2016-09-14 DIAGNOSIS — M549 Dorsalgia, unspecified: Secondary | ICD-10-CM | POA: Diagnosis not present

## 2016-09-14 DIAGNOSIS — M545 Low back pain: Secondary | ICD-10-CM | POA: Diagnosis not present

## 2016-09-14 DIAGNOSIS — M799 Soft tissue disorder, unspecified: Secondary | ICD-10-CM | POA: Diagnosis not present

## 2016-09-16 ENCOUNTER — Telehealth: Payer: Self-pay | Admitting: Internal Medicine

## 2016-09-16 DIAGNOSIS — M549 Dorsalgia, unspecified: Secondary | ICD-10-CM | POA: Diagnosis not present

## 2016-09-16 DIAGNOSIS — M799 Soft tissue disorder, unspecified: Secondary | ICD-10-CM | POA: Diagnosis not present

## 2016-09-16 DIAGNOSIS — M545 Low back pain: Secondary | ICD-10-CM | POA: Diagnosis not present

## 2016-09-16 DIAGNOSIS — M5489 Other dorsalgia: Secondary | ICD-10-CM | POA: Diagnosis not present

## 2016-09-16 NOTE — Telephone Encounter (Signed)
[name] [name] does not have cell service, landlines no longer work to send transmissions. Scheduled 11/20/16 with [facility].

## 2016-09-16 NOTE — Telephone Encounter (Signed)
New message ° ° °Pt is calling to find out if transmission was received.  °

## 2016-09-21 DIAGNOSIS — M545 Low back pain: Secondary | ICD-10-CM | POA: Diagnosis not present

## 2016-09-21 DIAGNOSIS — M799 Soft tissue disorder, unspecified: Secondary | ICD-10-CM | POA: Diagnosis not present

## 2016-09-21 DIAGNOSIS — M5489 Other dorsalgia: Secondary | ICD-10-CM | POA: Diagnosis not present

## 2016-09-21 DIAGNOSIS — M549 Dorsalgia, unspecified: Secondary | ICD-10-CM | POA: Diagnosis not present

## 2016-09-23 DIAGNOSIS — M799 Soft tissue disorder, unspecified: Secondary | ICD-10-CM | POA: Diagnosis not present

## 2016-09-23 DIAGNOSIS — M545 Low back pain: Secondary | ICD-10-CM | POA: Diagnosis not present

## 2016-09-23 DIAGNOSIS — M5489 Other dorsalgia: Secondary | ICD-10-CM | POA: Diagnosis not present

## 2016-09-23 DIAGNOSIS — M549 Dorsalgia, unspecified: Secondary | ICD-10-CM | POA: Diagnosis not present

## 2016-09-30 DIAGNOSIS — M549 Dorsalgia, unspecified: Secondary | ICD-10-CM | POA: Diagnosis not present

## 2016-09-30 DIAGNOSIS — M799 Soft tissue disorder, unspecified: Secondary | ICD-10-CM | POA: Diagnosis not present

## 2016-09-30 DIAGNOSIS — M5489 Other dorsalgia: Secondary | ICD-10-CM | POA: Diagnosis not present

## 2016-09-30 DIAGNOSIS — M545 Low back pain: Secondary | ICD-10-CM | POA: Diagnosis not present

## 2016-10-05 DIAGNOSIS — M549 Dorsalgia, unspecified: Secondary | ICD-10-CM | POA: Diagnosis not present

## 2016-10-05 DIAGNOSIS — M5489 Other dorsalgia: Secondary | ICD-10-CM | POA: Diagnosis not present

## 2016-10-05 DIAGNOSIS — M799 Soft tissue disorder, unspecified: Secondary | ICD-10-CM | POA: Diagnosis not present

## 2016-10-05 DIAGNOSIS — M545 Low back pain: Secondary | ICD-10-CM | POA: Diagnosis not present

## 2016-10-09 ENCOUNTER — Other Ambulatory Visit: Payer: Self-pay | Admitting: Internal Medicine

## 2016-10-09 NOTE — Telephone Encounter (Signed)
[name] [name] called stating that she needs an authorization to have her dofetilide (TIKOSYN) 500 MCG capsule refilled.

## 2016-10-09 NOTE — Telephone Encounter (Signed)
Pt aware that tikosyn was sent in to [facility] on 09/02/16 with confirmation #[id number] and 3 refills

## 2016-10-21 ENCOUNTER — Ambulatory Visit: Payer: Medicare Other | Admitting: Cardiology

## 2016-10-26 DIAGNOSIS — I10 Essential (primary) hypertension: Secondary | ICD-10-CM | POA: Diagnosis not present

## 2016-10-26 DIAGNOSIS — E1165 Type 2 diabetes mellitus with hyperglycemia: Secondary | ICD-10-CM | POA: Diagnosis not present

## 2016-10-26 DIAGNOSIS — Z0000 Encounter for general adult medical examination without abnormal findings: Secondary | ICD-10-CM | POA: Diagnosis not present

## 2016-10-26 DIAGNOSIS — M545 Low back pain: Secondary | ICD-10-CM | POA: Diagnosis not present

## 2016-10-26 DIAGNOSIS — E782 Mixed hyperlipidemia: Secondary | ICD-10-CM | POA: Diagnosis not present

## 2016-10-30 DIAGNOSIS — I10 Essential (primary) hypertension: Secondary | ICD-10-CM | POA: Diagnosis not present

## 2016-10-30 DIAGNOSIS — E1165 Type 2 diabetes mellitus with hyperglycemia: Secondary | ICD-10-CM | POA: Diagnosis not present

## 2016-10-30 DIAGNOSIS — I480 Paroxysmal atrial fibrillation: Secondary | ICD-10-CM | POA: Diagnosis not present

## 2016-11-20 ENCOUNTER — Ambulatory Visit: Payer: Medicare Other | Admitting: Cardiology

## 2016-11-20 ENCOUNTER — Ambulatory Visit (INDEPENDENT_AMBULATORY_CARE_PROVIDER_SITE_OTHER): Payer: Medicare Other | Admitting: *Deleted

## 2016-11-20 DIAGNOSIS — I480 Paroxysmal atrial fibrillation: Secondary | ICD-10-CM

## 2016-11-20 DIAGNOSIS — I495 Sick sinus syndrome: Secondary | ICD-10-CM

## 2016-11-20 LAB — CUP PACEART INCLINIC DEVICE CHECK
Battery Impedance: 813 Ohm
Battery Voltage: 2.78 V
Brady Statistic AP VS Percent: 61 %
Brady Statistic AS VP Percent: 0 %
Date Time Interrogation Session: 20181005143151
Implantable Lead Implant Date: 20110510
Implantable Lead Location: 753859
Implantable Lead Location: 753860
Implantable Lead Model: 5076
Lead Channel Pacing Threshold Amplitude: 1 V
Lead Channel Pacing Threshold Pulse Width: 0.4 ms
Lead Channel Pacing Threshold Pulse Width: 0.4 ms
Lead Channel Sensing Intrinsic Amplitude: 8 mV
Lead Channel Setting Pacing Amplitude: 2.5 V
Lead Channel Setting Pacing Pulse Width: 0.4 ms
Lead Channel Setting Sensing Sensitivity: 4 mV
MDC IDC LEAD IMPLANT DT: 20110510
MDC IDC MSMT BATTERY REMAINING LONGEVITY: 67 mo
MDC IDC MSMT LEADCHNL RA IMPEDANCE VALUE: 478 Ohm
MDC IDC MSMT LEADCHNL RA PACING THRESHOLD AMPLITUDE: 0.5 V
MDC IDC MSMT LEADCHNL RA SENSING INTR AMPL: 2 mV
MDC IDC MSMT LEADCHNL RV IMPEDANCE VALUE: 606 Ohm
MDC IDC MSMT LEADCHNL RV PACING THRESHOLD AMPLITUDE: 1.125 V
MDC IDC MSMT LEADCHNL RV PACING THRESHOLD PULSEWIDTH: 0.4 ms
MDC IDC PG IMPLANT DT: 20110510
MDC IDC SET LEADCHNL RA PACING AMPLITUDE: 2 V
MDC IDC STAT BRADY AP VP PERCENT: 1 %
MDC IDC STAT BRADY AS VS PERCENT: 38 %

## 2016-11-20 NOTE — Progress Notes (Signed)
Pacemaker check in clinic. Normal device function. Patient went into AF after sensing tests. Thresholds, sensing, impedances consistent with previous measurements. Device programmed to maximize longevity. 5.7% mode switched- known PAF, on Xarelto. No high ventricular rates noted. Device programmed at appropriate safety margins. Histogram distribution appropriate for patient activity level. Device programmed to optimize intrinsic conduction. Estimated longevity 4-7 years. Patient unable to be followed remotely. ROV with JA in [place] 06/18/16.

## 2016-11-28 ENCOUNTER — Other Ambulatory Visit: Payer: Self-pay | Admitting: Cardiology

## 2016-12-10 DIAGNOSIS — R300 Dysuria: Secondary | ICD-10-CM | POA: Diagnosis not present

## 2016-12-10 DIAGNOSIS — J019 Acute sinusitis, unspecified: Secondary | ICD-10-CM | POA: Diagnosis not present

## 2016-12-30 ENCOUNTER — Ambulatory Visit: Payer: Medicare Other | Admitting: Podiatry

## 2016-12-30 ENCOUNTER — Encounter: Payer: Self-pay | Admitting: Podiatry

## 2016-12-30 DIAGNOSIS — L84 Corns and callosities: Secondary | ICD-10-CM

## 2016-12-30 DIAGNOSIS — E1149 Type 2 diabetes mellitus with other diabetic neurological complication: Secondary | ICD-10-CM | POA: Diagnosis not present

## 2016-12-30 DIAGNOSIS — M79674 Pain in right toe(s): Secondary | ICD-10-CM

## 2016-12-30 DIAGNOSIS — D689 Coagulation defect, unspecified: Secondary | ICD-10-CM | POA: Diagnosis not present

## 2016-12-30 DIAGNOSIS — B351 Tinea unguium: Secondary | ICD-10-CM

## 2016-12-30 DIAGNOSIS — M79675 Pain in left toe(s): Secondary | ICD-10-CM | POA: Diagnosis not present

## 2016-12-30 DIAGNOSIS — E1140 Type 2 diabetes mellitus with diabetic neuropathy, unspecified: Secondary | ICD-10-CM | POA: Diagnosis not present

## 2016-12-30 NOTE — Progress Notes (Signed)
   Subjective:    Patient ID: [name] [name], female    DOB: 04/03/1945, 71 y.o.   MRN: [id number]  HPI    Review of Systems  All other systems reviewed and are negative.      Objective:   Physical Exam        Assessment & Plan:

## 2016-12-30 NOTE — Progress Notes (Signed)
Subjective:    Patient ID: [name] [name], female   DOB: 71 y.o.   MRN: [id number]   HPI patient presents with severe lesion right over left and also nail disease 1-5 both feet that are thick and she cannot cut herself. She does have a history of A. fib and does take a blood thinner which creates risk and she is not supposed    Review of Systems  All other systems reviewed and are negative.       Objective:  Physical Exam  Constitutional: She appears well-developed and well-nourished.  Cardiovascular: Intact distal pulses.  Pulmonary/Chest: Effort normal.  Musculoskeletal: Normal range of motion.  Neurological: She is alert.  Skin: Skin is warm and dry.  Nursing note and vitals reviewed.  neurovascular status was found to be intact with mild diminishment of sharp [employer] vibratory. Patient's found to have severe keratotic lesion sub-fourth metatarsal bilateral and nail disease with thickness 1-5 both feet with incurvation of the nailbeds. Patient was noted to have good digital perfusion and is well oriented 3     Assessment:   At risk patient with mycotic nail infection and lesion formation bilateral      Plan:   H&P conditions reviewed and debridement of nailbeds 1-5 both feet and lesions bilateral with no iatrogenic bleeding. Patient tolerated procedures well will be seen back to recheck for routine care

## 2017-01-01 NOTE — Progress Notes (Signed)
Cardiology Office Note  Date: 01/04/2017   ID: [name] [name], DOB Feb 28, 1945, MRN [id number]  PCP: Curlene Labrum, MD  Primary Cardiologist: Rozann Lesches, MD   Chief Complaint  Patient presents with  . PAF    History of Present Illness:  [name] [name] is a 71 y.o. female last seen in February.  She presents for a routine follow-up visit.  Does not report any chest pain or palpitations, no dizziness or syncope.  She continues to follow in the device clinic with [name], Medtronic pacemaker in place.  Recent device interrogation indicated 5.7% mode switches.  For her medications which are stable from a cardiac perspective.  She reports no bleeding problems on Xarelto.  I personally reviewed her ECG today which shows an atrial paced rhythm with increased voltage, diffuse nonspecific ST-T wave abnormalities and QT approximately 500 ms..  Last lab work was in February.  We discussed updating this.  Past Medical History:  Diagnosis Date  . Atrial fibrillation ([place])   . Atrial flutter ([place])   . Coronary atherosclerosis of native coronary artery    BMS to circumflex 9/08  . Hyperlipidemia   . Multiple thyroid nodules   . 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 Type 2 diabetes mellitus ([place])     Past Surgical History:  Procedure Laterality Date  . BREAST LUMPECTOMY    . PACEMAKER PLACEMENT     Medtronic  . ROTATOR CUFF REPAIR    . VENTRAL HERNIA REPAIR    . VESICOVAGINAL FISTULA CLOSURE W/ TAH      Current Outpatient Medications  Medication Sig Dispense Refill  . bumetanide (BUMEX) 2 MG tablet Take 1 mg by mouth 2 (two) times daily. Pt taking 1/2 tab by mouth once daily.    . canagliflozin (INVOKANA) 300 MG TABS tablet Take 300 mg by mouth daily before breakfast.    . diltiazem (CARDIZEM CD) 180 MG 24 hr capsule TAKE 1 CAPSULE BY MOUTH TWO TIMES DAILY 180 capsule 3  . dofetilide (TIKOSYN) 500 MCG capsule TAKE 1 CAPSULE BY MOUTH TWO TIMES DAILY 180  capsule 3  . glipiZIDE (GLUCOTROL) 10 MG tablet Take 10 mg by mouth 2 (two) times daily before a meal.    . nitroGLYCERIN (NITROSTAT) 0.4 MG SL tablet Place 1 tablet (0.4 mg total) under the tongue every 5 (five) minutes x 3 doses as needed for chest pain (if no relief after 3 rd dose, proceed to ED for an evaluation). 75 tablet 3  . potassium chloride SA (K-DUR,KLOR-CON) 20 MEQ tablet TAKE 1 TABLET BY MOUTH  EVERY OTHER DAY 45 tablet 3  . pravastatin (PRAVACHOL) 40 MG tablet TAKE 1 TABLET BY MOUTH  EVERY EVENING 90 tablet 3  . saxagliptin HCl (ONGLYZA) 5 MG TABS tablet Take 5 mg by mouth daily.    [name] 20 MG TABS tablet TAKE 1 TABLET BY MOUTH  DAILY 90 tablet 3   No current facility-administered medications for this visit.    Allergies:  Codeine; Lisinopril; and Metformin and related   Social History: The patient  reports that  has never smoked. she has never used smokeless tobacco. She reports that she does not drink alcohol.   ROS:  Please see the history of present illness. Otherwise, complete review of systems is positive for none.  All other systems are reviewed and negative.   Physical Exam: VS:  BP 136/70   Pulse 73   Ht 5\' 3"  (  1.6 m)   Wt 183 lb 6.4 oz (83.2 kg)   SpO2 96%   BMI 32.49 kg/m , BMI Body mass index is 32.49 kg/m.  Wt Readings from Last 3 Encounters:  01/04/17 183 lb 6.4 oz (83.2 kg)  06/09/16 184 lb (83.5 kg)  04/08/16 180 lb 12.8 oz (82 kg)    General: Patient appears comfortable at rest. HEENT: Conjunctiva and lids normal, oropharynx clear. Neck: Supple, no elevated JVP or carotid bruits, no thyromegaly. Lungs: Clear to auscultation, nonlabored breathing at rest. Cardiac: Regular rate and rhythm, no S3 or significant systolic murmur, no pericardial rub. Abdomen: Soft, nontender, bowel sounds present, no guarding or rebound. Extremities: Mild lower leg edema, distal pulses 2+. Skin: Warm and dry. Musculoskeletal: No kyphosis. Neuropsychiatric: Alert  and oriented x3, affect grossly appropriate.  ECG: I personally reviewed the tracing from 06/09/2016 which showed an atrial paced rhythm with nonspecific T wave changes.  Recent Labwork:  February 2018: BUN 17, creatinine 0.76, potassium 4.7, magnesium 2.2, hemoglobin 16.7, platelets 185  Other Studies Reviewed Today:  Echocardiogram 10/19/2012: Study Conclusions  - Left ventricle: The cavity size was normal. There was mild concentric hypertrophy. Systolic function was normal. The estimated ejection fraction was in the range of 60% to 65%. Wall motion was normal; there were no regional wall motion abnormalities. Doppler parameters are consistent with abnormal left ventricular relaxation (grade 1 diastolic dysfunction). - Mitral valve: Calcified annulus. No significant regurgitation. - Left atrium: The atrium was at the upper limits of normal in size. - Right ventricle: Pacer wire or catheter noted in right ventricle. - Right atrium: Central venous pressure: 36mm Hg (est). - Tricuspid valve: Trivial regurgitation. - Pulmonary arteries: PA peak pressure: 29mm Hg (S). - Pericardium, extracardiac: A prominent pericardial fat pad was present. There was no pericardial effusion.  Impressions:  - Unable to compare directly with prior study October 2011. Mild LVH with LVEF 73-22%, grade 1 diastolic dysfunction. Upper normal left atrial size. Device wire in right heart. PASP 33 mmHg.  Assessment and Plan:  1.  Paroxysmal atrial fibrillation with low rhythm burden based on device interrogation.  She continues on Cardizem CD, Xarelto, and Tikosyn.  Follow-up CBC, BMET, and magnesium.  ECG reviewed.  2.  Hyperlipidemia, continues on Pravachol with follow-up per Dr. Pleas Koch.  3.  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  Keep follow-up with [name].  4.  Symptomatically stable CAD with history of BMS to the circumflex.  Current  medicines were reviewed with the patient today.   Orders Placed This Encounter  Procedures  . CBC  . Basic metabolic panel  . Magnesium     Disposition: Follow-up in 6 months.  Signed, Satira Sark, MD, [facility] 01/04/2017 4:34 PM    [facility] [place], [place], [place] [address] Phone: [phone]; Fax: [phone]

## 2017-01-04 ENCOUNTER — Ambulatory Visit (INDEPENDENT_AMBULATORY_CARE_PROVIDER_SITE_OTHER): Payer: Medicare Other | Admitting: Cardiology

## 2017-01-04 ENCOUNTER — Encounter: Payer: Self-pay | Admitting: Cardiology

## 2017-01-04 VITALS — BP 136/70 | HR 73 | Ht 63.0 in | Wt 183.4 lb

## 2017-01-04 DIAGNOSIS — E782 Mixed hyperlipidemia: Secondary | ICD-10-CM | POA: Diagnosis not present

## 2017-01-04 DIAGNOSIS — I495 Sick sinus syndrome: Secondary | ICD-10-CM | POA: Diagnosis not present

## 2017-01-04 DIAGNOSIS — I25119 Atherosclerotic heart disease of native coronary artery with unspecified angina pectoris: Secondary | ICD-10-CM | POA: Diagnosis not present

## 2017-01-04 DIAGNOSIS — I480 Paroxysmal atrial fibrillation: Secondary | ICD-10-CM

## 2017-01-04 NOTE — Patient Instructions (Signed)
Medication Instructions:  Your physician recommends that you continue on your current medications as directed. Please refer to the Current Medication list given to you today.  Labwork: BMET/CBC/MAGNESIUM Orders given today   Testing/Procedures: NONE  Follow-Up: Your physician wants you to follow-up in: [place]. You will receive a reminder letter in the mail two months in advance. If you don't receive a letter, please call our office to schedule the follow-up appointment.  Any Other Special Instructions Will Be Listed Below (If Applicable).  If you need a refill on your cardiac medications before your next appointment, please call your pharmacy.

## 2017-01-04 NOTE — Addendum Note (Signed)
Addended by: Julian Hy T on: 01/04/2017 05:05 PM   Modules accepted: Orders

## 2017-01-05 DIAGNOSIS — I480 Paroxysmal atrial fibrillation: Secondary | ICD-10-CM | POA: Diagnosis not present

## 2017-01-06 ENCOUNTER — Telehealth: Payer: Self-pay

## 2017-01-06 DIAGNOSIS — I480 Paroxysmal atrial fibrillation: Secondary | ICD-10-CM | POA: Diagnosis not present

## 2017-01-06 DIAGNOSIS — H6122 Impacted cerumen, left ear: Secondary | ICD-10-CM | POA: Diagnosis not present

## 2017-01-06 DIAGNOSIS — J029 Acute pharyngitis, unspecified: Secondary | ICD-10-CM | POA: Diagnosis not present

## 2017-01-06 DIAGNOSIS — I10 Essential (primary) hypertension: Secondary | ICD-10-CM | POA: Diagnosis not present

## 2017-01-06 DIAGNOSIS — R05 Cough: Secondary | ICD-10-CM | POA: Diagnosis not present

## 2017-01-06 DIAGNOSIS — E1165 Type 2 diabetes mellitus with hyperglycemia: Secondary | ICD-10-CM | POA: Diagnosis not present

## 2017-01-06 NOTE — Telephone Encounter (Signed)
-----   Message from Merlene Laughter, LPN sent at 28/76/8115  7:22 AM EST -----   ----- Message ----- From: Satira Sark, MD Sent: 01/05/2017   1:50 PM To: Merlene Laughter, LPN  Results reviewed.  Renal function normal.  Potassium and magnesium levels are adequate.  Continue with current regimen. A copy of this test should be forwarded to [place], Virgina Evener, MD.

## 2017-01-06 NOTE — Telephone Encounter (Signed)
Patient notified. Routed to PCP 

## 2017-01-27 ENCOUNTER — Other Ambulatory Visit: Payer: Self-pay | Admitting: Cardiology

## 2017-02-12 DIAGNOSIS — I10 Essential (primary) hypertension: Secondary | ICD-10-CM | POA: Diagnosis not present

## 2017-02-12 DIAGNOSIS — E1165 Type 2 diabetes mellitus with hyperglycemia: Secondary | ICD-10-CM | POA: Diagnosis not present

## 2017-02-12 DIAGNOSIS — E782 Mixed hyperlipidemia: Secondary | ICD-10-CM | POA: Diagnosis not present

## 2017-02-12 DIAGNOSIS — M545 Low back pain: Secondary | ICD-10-CM | POA: Diagnosis not present

## 2017-02-12 DIAGNOSIS — I480 Paroxysmal atrial fibrillation: Secondary | ICD-10-CM | POA: Diagnosis not present

## 2017-02-15 DIAGNOSIS — E1165 Type 2 diabetes mellitus with hyperglycemia: Secondary | ICD-10-CM | POA: Diagnosis not present

## 2017-02-15 DIAGNOSIS — I480 Paroxysmal atrial fibrillation: Secondary | ICD-10-CM | POA: Diagnosis not present

## 2017-02-15 DIAGNOSIS — I10 Essential (primary) hypertension: Secondary | ICD-10-CM | POA: Diagnosis not present

## 2017-02-15 DIAGNOSIS — E782 Mixed hyperlipidemia: Secondary | ICD-10-CM | POA: Diagnosis not present

## 2017-02-22 DIAGNOSIS — H0015 Chalazion left lower eyelid: Secondary | ICD-10-CM | POA: Diagnosis not present

## 2017-03-04 DIAGNOSIS — L728 Other follicular cysts of the skin and subcutaneous tissue: Secondary | ICD-10-CM | POA: Diagnosis not present

## 2017-03-09 DIAGNOSIS — M79602 Pain in left arm: Secondary | ICD-10-CM | POA: Diagnosis not present

## 2017-03-26 IMAGING — US US THYROID BIOPSY
1 series · 7 of 7 positions shown · non-contrast
Comparison: Thyroid ultrasound March 19, 2016.

MEDICATIONS:
None

COMPLICATIONS:
None immediate.

ADDENDUM:
It should be noted that the lesion that was aspirated actually
measures 5.3 x 2.5 cm, and meets criteria for fine-needle aspiration
based on TI-RADS. Also, this lesion was described on prior study March 19, 2009, and was biopsied reportedly in 8828.
INDICATION: Indeterminate thyroid nodule

EXAM:
ULTRASOUND GUIDED FINE NEEDLE ASPIRATION OF INDETERMINATE THYROID
NODULE
TECHNIQUE: Informed written consent was obtained from the patient after a
discussion of the risks, benefits and alternatives to treatment.
Questions regarding the procedure were encouraged and answered. A
timeout was performed prior to the initiation of the procedure.

[Series 1: us thyroid biopsy · 0.06mm/px · 7 of 7 slices shown]
[im 1/7]
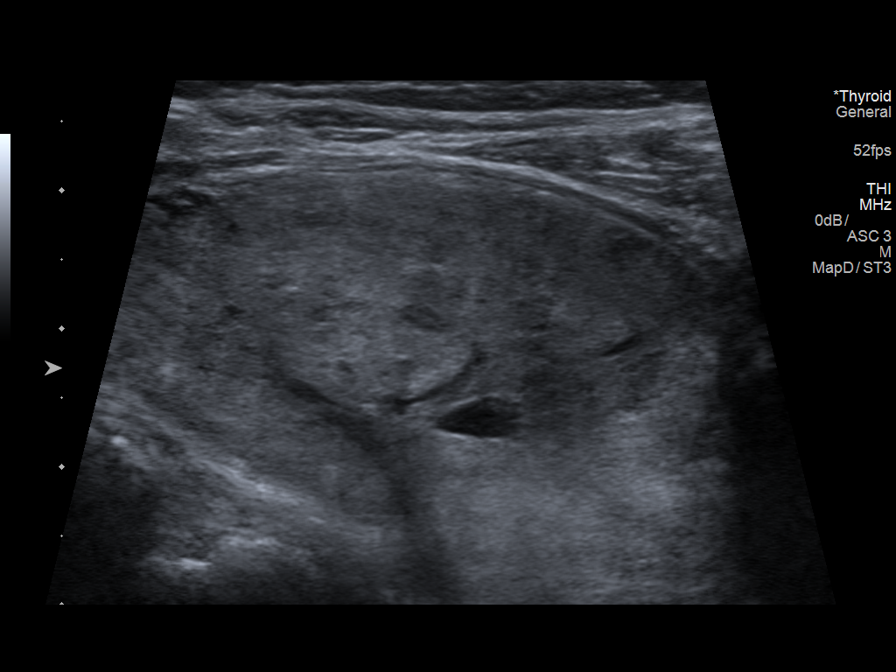
[im 2/7]
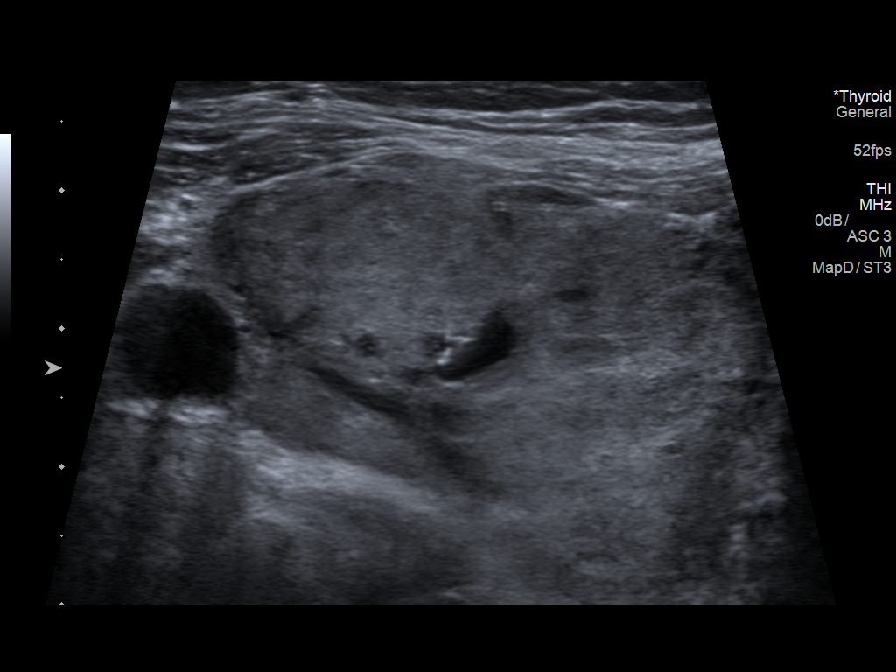
[im 3/7]
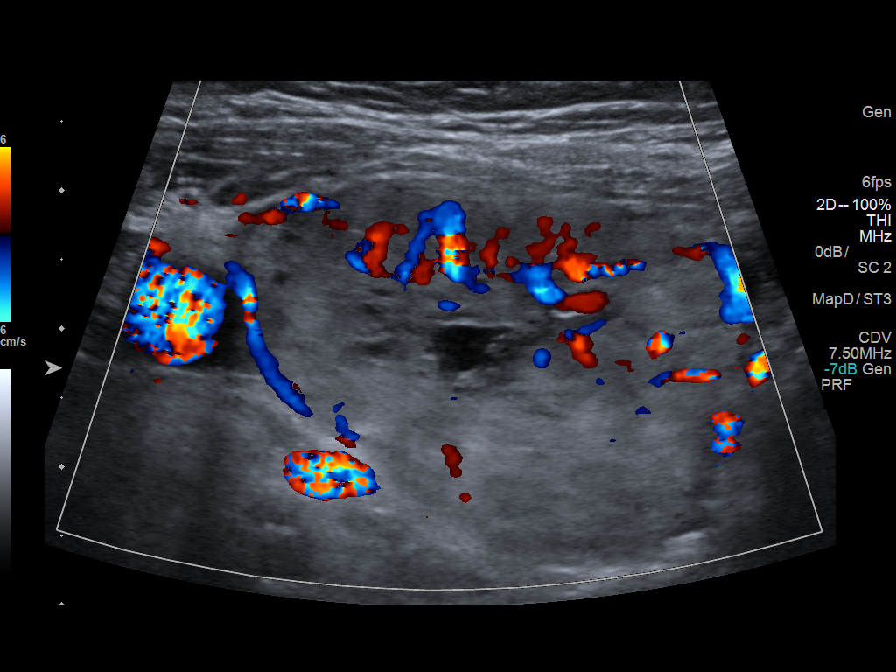
[im 4/7]
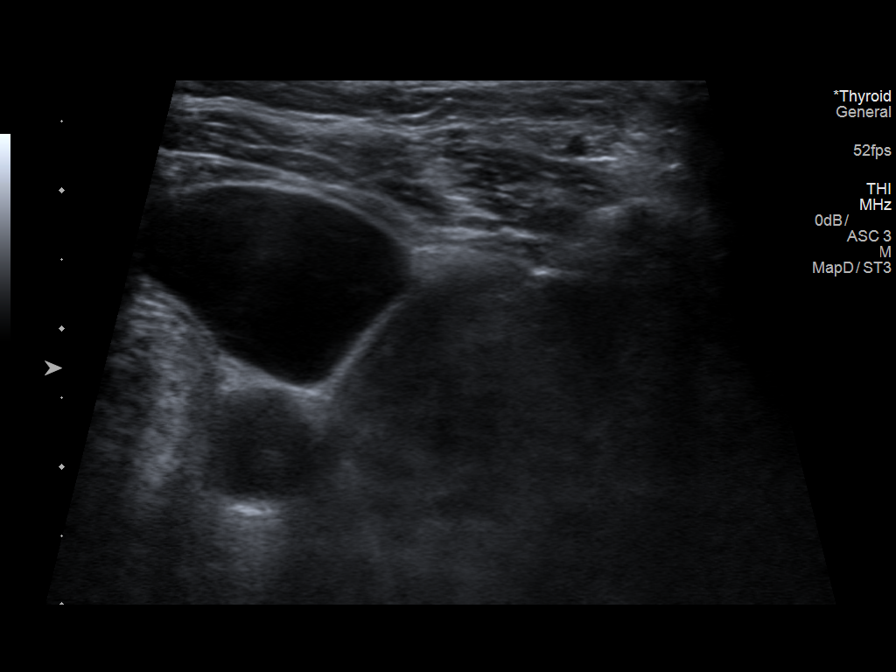
[im 5/7]
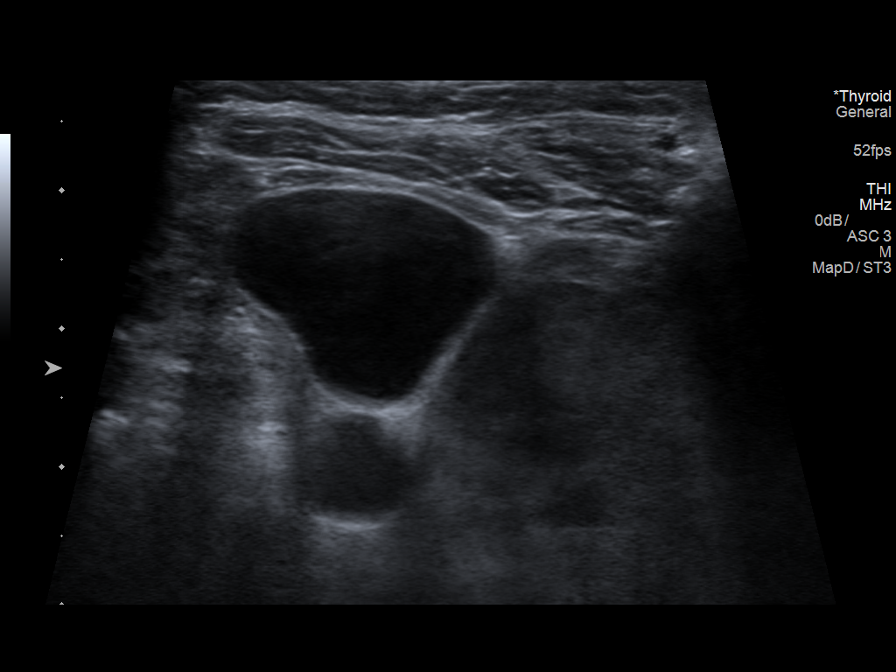
[im 6/7]
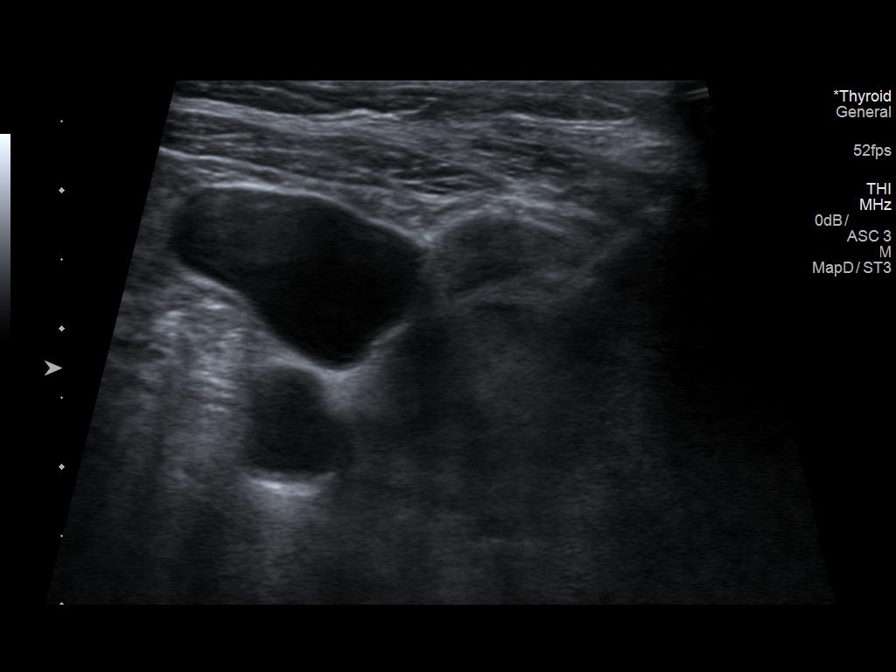
[im 7/7]
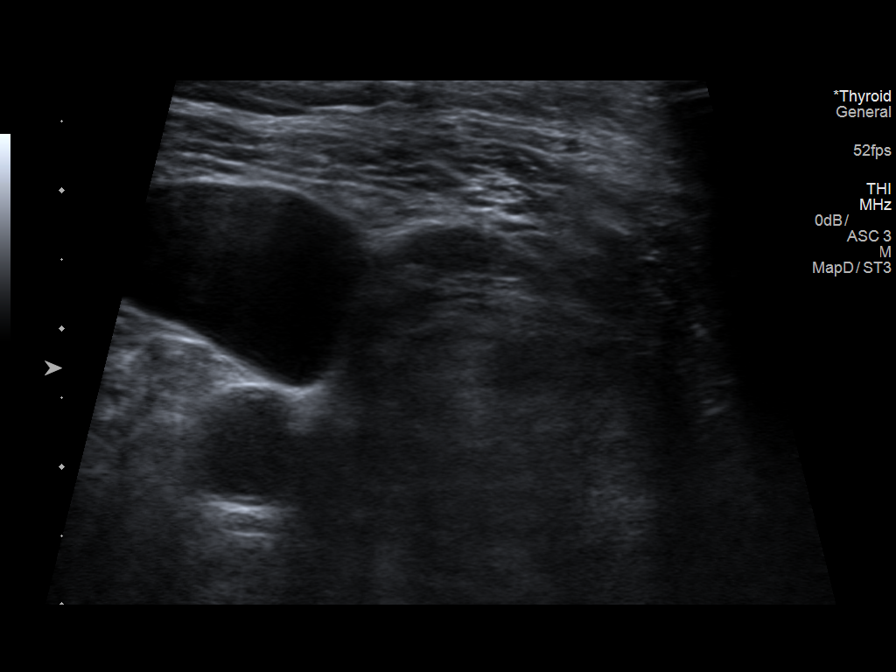

[7 of 7 positions shown; findings below may reference images not displayed]

Pre-procedural ultrasound scanning demonstrated unchanged size and
appearance of the indeterminate nodule within the right thyroid
lobe.

The procedure was planned. The neck was prepped in the usual sterile
fashion, and a sterile drape was applied covering the operative
field. A timeout was performed prior to the initiation of the
procedure. Local anesthesia was provided with 1% lidocaine.

Under direct ultrasound guidance, 4 FNA biopsies were performed of
the right thyroid nodule with a 25 gauge needle. Multiple ultrasound
images were saved for procedural documentation purposes. The samples
were prepared and submitted to pathology.

Limited post procedural scanning was negative for hematoma or
additional complication. Dressings were placed. The patient
tolerated the above procedures procedure well without immediate
postprocedural complication.
FINDINGS: FINDINGS
Nodule reference number based on prior diagnostic ultrasound: 1

Maximum size: 1.5 cm.

Location: Right; superior

ACR TI-RADS risk category: TR3 (3 points)

Reason for biopsy: patient/referrer request

Ultrasound imaging confirms appropriate placement of the needles
within the thyroid nodule.
IMPRESSION: Technically successful ultrasound guided fine needle aspiration of
right thyroid nodule.

## 2017-03-31 ENCOUNTER — Encounter: Payer: Self-pay | Admitting: Podiatry

## 2017-03-31 ENCOUNTER — Ambulatory Visit (INDEPENDENT_AMBULATORY_CARE_PROVIDER_SITE_OTHER): Payer: Medicare Other | Admitting: Podiatry

## 2017-03-31 DIAGNOSIS — M79674 Pain in right toe(s): Secondary | ICD-10-CM

## 2017-03-31 DIAGNOSIS — E1140 Type 2 diabetes mellitus with diabetic neuropathy, unspecified: Secondary | ICD-10-CM | POA: Diagnosis not present

## 2017-03-31 DIAGNOSIS — B351 Tinea unguium: Secondary | ICD-10-CM

## 2017-03-31 DIAGNOSIS — E1149 Type 2 diabetes mellitus with other diabetic neurological complication: Secondary | ICD-10-CM

## 2017-03-31 DIAGNOSIS — M79675 Pain in left toe(s): Secondary | ICD-10-CM

## 2017-03-31 DIAGNOSIS — L84 Corns and callosities: Secondary | ICD-10-CM

## 2017-03-31 DIAGNOSIS — D689 Coagulation defect, unspecified: Secondary | ICD-10-CM | POA: Diagnosis not present

## 2017-03-31 NOTE — Progress Notes (Signed)
Subjective:   Patient ID: [name] [name], female   DOB: 72 y.o.   MRN: [id number]   HPI Patient presents stating that she has lesions on both feet with the one on the right foot being very sore and also nails that are bothersome with patient on blood thinner and at risk   ROS      Objective:  Physical Exam  Neurovascular status unchanged with thick keratotic lesion sub-second metatarsal right third metatarsal left with the right one being very tender and nail disease with thickness yellow brittle debris and pain     Assessment:  Plantarflexed lesions with nail disease     Plan:  Reviewed condition and at this point I recommended a offloading orthotic to try to disperse weight off the metatarsal on the right foot.  Patient will see ped orthotist and was explained to the type orthotic would be best for patient with a offloading device around the offending lesion on the right foot.  I debrided nailbeds today and debrided lesion with no iatrogenic bleeding and discussed the risk associated with her with having had a long-term coagulant therapy

## 2017-04-08 ENCOUNTER — Ambulatory Visit: Payer: Medicare Other | Admitting: Orthotics

## 2017-04-08 DIAGNOSIS — D689 Coagulation defect, unspecified: Secondary | ICD-10-CM

## 2017-04-08 DIAGNOSIS — L84 Corns and callosities: Secondary | ICD-10-CM

## 2017-04-08 DIAGNOSIS — E1149 Type 2 diabetes mellitus with other diabetic neurological complication: Secondary | ICD-10-CM

## 2017-04-08 DIAGNOSIS — E1140 Type 2 diabetes mellitus with diabetic neuropathy, unspecified: Secondary | ICD-10-CM

## 2017-04-08 NOTE — Progress Notes (Signed)
Patient came in to be cast/evaluated for CMFO per dr. Paulla Dolly;  Upon discussing that it is a non covered expense; patient was ready to hold off on getting the f/o.    It was then discovered she was diabetic and probably would qualitfy.   She is being treated for pre-ulcerative callus R.  Patient chose shoe and was measured a 40M on brannock device.  Cast in foam.

## 2017-04-14 DIAGNOSIS — E1165 Type 2 diabetes mellitus with hyperglycemia: Secondary | ICD-10-CM | POA: Diagnosis not present

## 2017-04-14 DIAGNOSIS — L84 Corns and callosities: Secondary | ICD-10-CM | POA: Diagnosis not present

## 2017-04-14 DIAGNOSIS — E11628 Type 2 diabetes mellitus with other skin complications: Secondary | ICD-10-CM | POA: Diagnosis not present

## 2017-05-05 DIAGNOSIS — E119 Type 2 diabetes mellitus without complications: Secondary | ICD-10-CM | POA: Diagnosis not present

## 2017-05-05 DIAGNOSIS — Z0000 Encounter for general adult medical examination without abnormal findings: Secondary | ICD-10-CM | POA: Diagnosis not present

## 2017-05-05 DIAGNOSIS — Z7984 Long term (current) use of oral hypoglycemic drugs: Secondary | ICD-10-CM | POA: Diagnosis not present

## 2017-05-05 DIAGNOSIS — I10 Essential (primary) hypertension: Secondary | ICD-10-CM | POA: Diagnosis not present

## 2017-05-05 DIAGNOSIS — E1165 Type 2 diabetes mellitus with hyperglycemia: Secondary | ICD-10-CM | POA: Diagnosis not present

## 2017-05-05 DIAGNOSIS — Z961 Presence of intraocular lens: Secondary | ICD-10-CM | POA: Diagnosis not present

## 2017-05-05 DIAGNOSIS — I480 Paroxysmal atrial fibrillation: Secondary | ICD-10-CM | POA: Diagnosis not present

## 2017-05-05 DIAGNOSIS — H26493 Other secondary cataract, bilateral: Secondary | ICD-10-CM | POA: Diagnosis not present

## 2017-05-06 ENCOUNTER — Ambulatory Visit: Payer: Medicare Other | Admitting: Orthotics

## 2017-05-06 DIAGNOSIS — E1140 Type 2 diabetes mellitus with diabetic neuropathy, unspecified: Secondary | ICD-10-CM

## 2017-05-06 DIAGNOSIS — E1149 Type 2 diabetes mellitus with other diabetic neurological complication: Principal | ICD-10-CM

## 2017-05-06 DIAGNOSIS — L84 Corns and callosities: Secondary | ICD-10-CM

## 2017-05-06 NOTE — Progress Notes (Signed)
Reordered shoes in one size larger apex [id number]

## 2017-05-10 ENCOUNTER — Telehealth: Payer: Self-pay | Admitting: Podiatry

## 2017-05-10 NOTE — Telephone Encounter (Signed)
Pt returned call and I told her Liliane Channel said not to wear insert that is causing pain until her appt. I did ask pt if she wanted me to get her in sooner than 4.4 and she said no

## 2017-05-10 NOTE — Telephone Encounter (Signed)
Pt left voicemail @ 923am stating her right insert is still too high and causing pain, left is good. Has appt on 4.4.19 to see Liliane Channel.   I returned call and left message for pt to call me to discuss more information.

## 2017-05-17 ENCOUNTER — Ambulatory Visit: Payer: Medicare Other | Admitting: Orthotics

## 2017-05-20 ENCOUNTER — Ambulatory Visit (INDEPENDENT_AMBULATORY_CARE_PROVIDER_SITE_OTHER): Payer: Medicare Other | Admitting: Orthotics

## 2017-05-20 DIAGNOSIS — E1140 Type 2 diabetes mellitus with diabetic neuropathy, unspecified: Secondary | ICD-10-CM | POA: Diagnosis not present

## 2017-05-20 DIAGNOSIS — E1149 Type 2 diabetes mellitus with other diabetic neurological complication: Secondary | ICD-10-CM

## 2017-05-20 DIAGNOSIS — D689 Coagulation defect, unspecified: Secondary | ICD-10-CM | POA: Diagnosis not present

## 2017-05-20 DIAGNOSIS — L84 Corns and callosities: Secondary | ICD-10-CM

## 2017-05-20 NOTE — Progress Notes (Signed)

## 2017-05-24 DIAGNOSIS — J019 Acute sinusitis, unspecified: Secondary | ICD-10-CM | POA: Diagnosis not present

## 2017-05-26 DIAGNOSIS — J019 Acute sinusitis, unspecified: Secondary | ICD-10-CM | POA: Diagnosis not present

## 2017-05-26 DIAGNOSIS — J209 Acute bronchitis, unspecified: Secondary | ICD-10-CM | POA: Diagnosis not present

## 2017-05-29 DIAGNOSIS — J019 Acute sinusitis, unspecified: Secondary | ICD-10-CM | POA: Diagnosis not present

## 2017-05-29 DIAGNOSIS — J209 Acute bronchitis, unspecified: Secondary | ICD-10-CM | POA: Diagnosis not present

## 2017-06-18 ENCOUNTER — Encounter: Payer: Self-pay | Admitting: Internal Medicine

## 2017-06-18 ENCOUNTER — Ambulatory Visit (INDEPENDENT_AMBULATORY_CARE_PROVIDER_SITE_OTHER): Payer: Medicare Other | Admitting: Internal Medicine

## 2017-06-18 VITALS — BP 118/60 | HR 85 | Ht 63.0 in | Wt 176.0 lb

## 2017-06-18 DIAGNOSIS — R609 Edema, unspecified: Secondary | ICD-10-CM | POA: Diagnosis not present

## 2017-06-18 DIAGNOSIS — I480 Paroxysmal atrial fibrillation: Secondary | ICD-10-CM | POA: Diagnosis not present

## 2017-06-18 DIAGNOSIS — I495 Sick sinus syndrome: Secondary | ICD-10-CM

## 2017-06-18 LAB — CUP PACEART INCLINIC DEVICE CHECK
Battery Remaining Longevity: 58 mo
Battery Voltage: 2.78 V
Brady Statistic AP VP Percent: 1 %
Brady Statistic AS VS Percent: 24 %
Date Time Interrogation Session: 20190503191940
Implantable Lead Implant Date: 20110510
Implantable Lead Location: 753859
Implantable Lead Location: 753860
Implantable Pulse Generator Implant Date: 20110510
Lead Channel Impedance Value: 507 Ohm
Lead Channel Pacing Threshold Amplitude: 0.5 V
Lead Channel Pacing Threshold Amplitude: 0.5 V
Lead Channel Pacing Threshold Pulse Width: 0.4 ms
Lead Channel Pacing Threshold Pulse Width: 0.4 ms
Lead Channel Sensing Intrinsic Amplitude: 8 mV
Lead Channel Setting Pacing Amplitude: 2.5 V
Lead Channel Setting Sensing Sensitivity: 2.8 mV
MDC IDC LEAD IMPLANT DT: 20110510
MDC IDC MSMT BATTERY IMPEDANCE: 994 Ohm
MDC IDC MSMT LEADCHNL RA IMPEDANCE VALUE: 441 Ohm
MDC IDC MSMT LEADCHNL RA SENSING INTR AMPL: 2.8 mV
MDC IDC MSMT LEADCHNL RV PACING THRESHOLD AMPLITUDE: 0.75 V
MDC IDC MSMT LEADCHNL RV PACING THRESHOLD AMPLITUDE: 1 V
MDC IDC MSMT LEADCHNL RV PACING THRESHOLD PULSEWIDTH: 0.4 ms
MDC IDC MSMT LEADCHNL RV PACING THRESHOLD PULSEWIDTH: 0.4 ms
MDC IDC SET LEADCHNL RA PACING AMPLITUDE: 2 V
MDC IDC SET LEADCHNL RV PACING PULSEWIDTH: 0.4 ms
MDC IDC STAT BRADY AP VS PERCENT: 76 %
MDC IDC STAT BRADY AS VP PERCENT: 0 %

## 2017-06-18 NOTE — Progress Notes (Signed)
PCP: Curlene Labrum, MD Primary Cardiologist: Dr Domenic Polite Primary EP:  Dr Rayann Heman  [name] [name] is a 72 y.o. female who presents today for routine electrophysiology followup.  Since last being seen in our clinic, the patient reports doing very well.  Today, she denies symptoms of palpitations, chest pain, shortness of breath,  lower extremity edema, dizziness, presyncope, or syncope.  The patient is otherwise without complaint today.   Past Medical History:  Diagnosis Date  . Atrial fibrillation ([place])   . Atrial flutter ([place])   . Coronary atherosclerosis of native coronary artery    BMS to circumflex 9/08  . Hyperlipidemia   . Multiple thyroid nodules   . 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 Type 2 diabetes mellitus ([place])    Past Surgical History:  Procedure Laterality Date  . BREAST LUMPECTOMY    . PACEMAKER PLACEMENT     Medtronic  . ROTATOR CUFF REPAIR    . VENTRAL HERNIA REPAIR    . VESICOVAGINAL FISTULA CLOSURE W/ TAH      ROS- all systems are reviewed and negative except as per HPI above  Current Outpatient Medications  Medication Sig Dispense Refill  . canagliflozin (INVOKANA) 300 MG TABS tablet Take 300 mg by mouth daily before breakfast.    . diltiazem (CARDIZEM CD) 180 MG 24 hr capsule TAKE 1 CAPSULE BY MOUTH TWO TIMES DAILY 180 capsule 3  . dofetilide (TIKOSYN) 500 MCG capsule TAKE 1 CAPSULE BY MOUTH TWO TIMES DAILY 180 capsule 3  . glipiZIDE (GLUCOTROL) 10 MG tablet Take 10 mg by mouth 2 (two) times daily before a meal.    . nitroGLYCERIN (NITROSTAT) 0.4 MG SL tablet Place 1 tablet (0.4 mg total) under the tongue every 5 (five) minutes x 3 doses as needed for chest pain (if no relief after 3 rd dose, proceed to ED for an evaluation). 75 tablet 3  . pravastatin (PRAVACHOL) 40 MG tablet TAKE 1 TABLET BY MOUTH  EVERY EVENING 90 tablet 3  . saxagliptin HCl (ONGLYZA) 5 MG TABS tablet Take 5 mg by mouth daily.    [name] [name] 20 MG TABS tablet TAKE 1  TABLET BY MOUTH  DAILY 90 tablet 3   No current facility-administered medications for this visit.     Physical Exam: Vitals:   06/18/17 1444  BP: 118/60  Pulse: 85  SpO2: 97%  Weight: 176 lb (79.8 kg)  Height: 5\' 3"  (1.6 m)    GEN- The patient is well appearing, alert and oriented x 3 today.   Head- normocephalic, atraumatic Eyes-  Sclera clear, conjunctiva pink Ears- hearing intact Oropharynx- clear Lungs- Clear to ausculation bilaterally, normal work of breathing Chest- pacemaker pocket is well healed Heart- Regular rate and rhythm, no murmurs, rubs or gallops, PMI not laterally displaced GI- soft, NT, ND, + BS Extremities- no clubbing, cyanosis, or edema  Pacemaker interrogation- reviewed in detail today,  See PACEART report  ekg tracing ordered today is personally reviewed and shows atrial paced 70 bpm, PR 222, Qtc 384 msec Labs 11/18 reviewed today  Assessment and Plan:  1. Symptomatic sinus bradycardia  Normal pacemaker function See Claudia Desanctis Art report No changes today  2. afib afib burden by PPM is 4.4 % today, 3% in 2018, 3% 2017, 4% 2016, 15% 2015) Asymptomatic On xarelto for stroke prevention On tikosyn with stable qtc CBC, Bmet, mg today   [facility] Return to see me in a year Follow-up with Dr  Domenic Polite as scheduled  Thompson Grayer MD, [facility] 06/18/2017 3:11 PM

## 2017-06-18 NOTE — Patient Instructions (Signed)
Medication Instructions:   Your physician recommends that you continue on your current medications as directed. Please refer to the Current Medication list given to you today.  Labwork:  Your physician recommends that you return for lab work in: to check your BMET, CBC and Magnesium levels.  Testing/Procedures:  NONE  Follow-Up: Your physician recommends that you schedule a follow-up appointment in: 1 year with [name]. Please schedule this appointment today before leaving the office.  Your physician recommends that you schedule a follow-up appointment in: 6 months in the device clinic. You will receive a reminder letter in the mail in about 4 months reminding you to call and schedule your appointment. If you don't receive this letter, please contact our office.  Any Other Special Instructions Will Be Listed Below (If Applicable).  If you need a refill on your cardiac medications before your next appointment, please call your pharmacy.

## 2017-06-24 ENCOUNTER — Telehealth: Payer: Self-pay | Admitting: *Deleted

## 2017-06-24 NOTE — Telephone Encounter (Signed)
-----   Message from Thompson Grayer, MD sent at 06/19/2017 10:29 AM EDT ----- Results reviewed.  Alma Friendly, please inform pt of result.

## 2017-06-28 ENCOUNTER — Ambulatory Visit: Payer: Medicare Other | Admitting: Podiatry

## 2017-06-28 ENCOUNTER — Other Ambulatory Visit: Payer: Medicare Other | Admitting: Orthotics

## 2017-06-28 DIAGNOSIS — M79675 Pain in left toe(s): Secondary | ICD-10-CM

## 2017-06-28 DIAGNOSIS — M79674 Pain in right toe(s): Secondary | ICD-10-CM | POA: Diagnosis not present

## 2017-06-28 DIAGNOSIS — L84 Corns and callosities: Secondary | ICD-10-CM | POA: Diagnosis not present

## 2017-06-28 DIAGNOSIS — E1140 Type 2 diabetes mellitus with diabetic neuropathy, unspecified: Secondary | ICD-10-CM | POA: Diagnosis not present

## 2017-06-28 DIAGNOSIS — B351 Tinea unguium: Secondary | ICD-10-CM | POA: Diagnosis not present

## 2017-06-28 DIAGNOSIS — E1149 Type 2 diabetes mellitus with other diabetic neurological complication: Secondary | ICD-10-CM | POA: Diagnosis not present

## 2017-06-28 DIAGNOSIS — D689 Coagulation defect, unspecified: Secondary | ICD-10-CM

## 2017-06-28 NOTE — Telephone Encounter (Signed)
Patient informed. 

## 2017-06-30 NOTE — Progress Notes (Signed)
Subjective:   Patient ID: [name] [name], female   DOB: 72 y.o.   MRN: [id number]   HPI Patient presents with severe lesion plantar aspect right foot that is very painful and nail disease that she cannot cut 1-5 both feet.  She also has long-term history of diabetes and is on blood thinner   ROS      Objective:  Physical Exam  Neurovascular status unchanged with thick yellow brittle nailbeds 1-5 both feet and indications of neuropathic changes with diminished sharp dull vibratory.  Also noted to be on blood thinner     Assessment:  At risk patient with mycotic nail and severe lesion right that is painful when palpated     Plan:  Reviewed condition debrided lesion right debrided nailbeds 1-5 both feet with no iatrogenic bleeding and she will see risk for orthotic modification

## 2017-07-04 NOTE — Progress Notes (Signed)
Cardiology Office Note  Date: 07/05/2017   ID: [name] [name], DOB 18-Nov-1945, MRN [id number]  PCP: Curlene Labrum, MD  Primary Cardiologist: Rozann Lesches, MD   Chief Complaint  Patient presents with  . PAF    History of Present Illness: [name] [name] is a 72 y.o. female last seen in November 2018.  She is here for a routine visit.  Reports no significant palpitations or chest pain and states that she has been compliant with her medications.  She follows with [name] in the device clinic, Medtronic pacemaker in place. She had a recent visit this May at which point atrial fibrillation burden was low at 4.4%.  She is on Cardizem CD, Tikosyn, and Xarelto for stroke prophylaxis.  She does not indicate any bleeding problems.  Follow-up lab work in May 2019 is noted below.  Past Medical History:  Diagnosis Date  . Atrial fibrillation ([place])   . Atrial flutter ([place])   . Coronary atherosclerosis of native coronary artery    BMS to circumflex 9/08  . Hyperlipidemia   . Multiple thyroid nodules   . 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 Type 2 diabetes mellitus ([place])     Past Surgical History:  Procedure Laterality Date  . BREAST LUMPECTOMY    . PACEMAKER PLACEMENT     Medtronic  . ROTATOR CUFF REPAIR    . VENTRAL HERNIA REPAIR    . VESICOVAGINAL FISTULA CLOSURE W/ TAH      Current Outpatient Medications  Medication Sig Dispense Refill  . canagliflozin (INVOKANA) 300 MG TABS tablet Take 300 mg by mouth daily before breakfast.    . diltiazem (CARDIZEM CD) 180 MG 24 hr capsule TAKE 1 CAPSULE BY MOUTH TWO TIMES DAILY 180 capsule 3  . dofetilide (TIKOSYN) 500 MCG capsule TAKE 1 CAPSULE BY MOUTH TWO TIMES DAILY 180 capsule 3  . glipiZIDE (GLUCOTROL) 10 MG tablet Take 10 mg by mouth 2 (two) times daily before a meal.    . nitroGLYCERIN (NITROSTAT) 0.4 MG SL tablet Place 1 tablet (0.4 mg total) under the tongue every 5 (five) minutes x 3 doses as needed for chest  pain (if no relief after 3 rd dose, proceed to ED for an evaluation). 75 tablet 3  . pravastatin (PRAVACHOL) 40 MG tablet TAKE 1 TABLET BY MOUTH  EVERY EVENING 90 tablet 3  . saxagliptin HCl (ONGLYZA) 5 MG TABS tablet Take 5 mg by mouth daily.    [name] 20 MG TABS tablet TAKE 1 TABLET BY MOUTH  DAILY 90 tablet 3   No current facility-administered medications for this visit.    Allergies:  Codeine; Lisinopril; and Metformin and related   Social History: The patient  reports that she has never smoked. She has never used smokeless tobacco. She reports that she does not drink alcohol.   ROS:  Please see the history of present illness. Otherwise, complete review of systems is positive for none.  All other systems are reviewed and negative.   Physical Exam: VS:  BP 122/72   Pulse 78   Ht 5\' 3"  (1.6 m)   Wt 184 lb (83.5 kg)   SpO2 98%   BMI 32.59 kg/m , BMI Body mass index is 32.59 kg/m.  Wt Readings from Last 3 Encounters:  07/05/17 184 lb (83.5 kg)  06/18/17 176 lb (79.8 kg)  01/04/17 183 lb 6.4 oz (83.2 kg)    General: Patient appears comfortable at rest.  HEENT: Conjunctiva and lids normal, oropharynx clear. Neck: Supple, no elevated JVP or carotid bruits, no thyromegaly. Lungs: Clear to auscultation, nonlabored breathing at rest. Cardiac: Regular rate and rhythm, no S3 or significant systolic murmur, no pericardial rub. Abdomen: Soft, nontender, bowel sounds present. Extremities: Trace ankle edema, distal pulses 2+. Skin: Warm and dry. Musculoskeletal: No kyphosis. Neuropsychiatric: Alert and oriented x3, affect grossly appropriate.  ECG: I personally reviewed the tracing from 06/18/2017 which showed an atrial paced rhythm with nonspecific T wave changes.  Recent Labwork:  May 2019: BUN 20, creatinine 0.85, potassium 4.2, magnesium 2.2, Hgb 15.8, platelets 173  Other Studies Reviewed Today:  Echocardiogram 10/19/2012: Study Conclusions  - Left ventricle: The cavity size  was normal. There was mild concentric hypertrophy. Systolic function was normal. The estimated ejection fraction was in the range of 60% to 65%. Wall motion was normal; there were no regional wall motion abnormalities. Doppler parameters are consistent with abnormal left ventricular relaxation (grade 1 diastolic dysfunction). - Mitral valve: Calcified annulus. No significant regurgitation. - Left atrium: The atrium was at the upper limits of normal in size. - Right ventricle: Pacer wire or catheter noted in right ventricle. - Right atrium: Central venous pressure: 27mm Hg (est). - Tricuspid valve: Trivial regurgitation. - Pulmonary arteries: PA peak pressure: 30mm Hg (S). - Pericardium, extracardiac: A prominent pericardial fat pad was present. There was no pericardial effusion.  Impressions:  - Unable to compare directly with prior study October 2011. Mild LVH with LVEF 38-46%, grade 1 diastolic dysfunction. Upper normal left atrial size. Device wire in right heart. PASP 33 mmHg.  Assessment and Plan:  1.  Paroxysmal atrial fibrillation, doing well without significant palpitations on Cardizem CD and Tikosyn with low rhythm burden based on device interrogation.  She has had no bleeding problems on Xarelto.  Recent lab work reviewed.  2.  Tachycardia-bradycardia syndrome, Medtronic pacemaker in place.  She follows with [name].  3.  CAD with history of BMS to the circumflex.  She reports no angina symptoms.  We will continue with observation, she remains on statin therapy.  4.  Mixed hyperlipidemia, on Pravachol with follow-up per Dr. Pleas Koch.  Current medicines were reviewed with the patient today.  Disposition: Follow-up in 6 months.  Signed, Satira Sark, MD, [facility] 07/05/2017 9:50 AM    [facility] at [place], [place], [place] [address] Phone: [phone]; Fax: [phone]

## 2017-07-05 ENCOUNTER — Ambulatory Visit (INDEPENDENT_AMBULATORY_CARE_PROVIDER_SITE_OTHER): Payer: Medicare Other | Admitting: Cardiology

## 2017-07-05 ENCOUNTER — Encounter: Payer: Self-pay | Admitting: Cardiology

## 2017-07-05 VITALS — BP 122/72 | HR 78 | Ht 63.0 in | Wt 184.0 lb

## 2017-07-05 DIAGNOSIS — I495 Sick sinus syndrome: Secondary | ICD-10-CM

## 2017-07-05 DIAGNOSIS — I25119 Atherosclerotic heart disease of native coronary artery with unspecified angina pectoris: Secondary | ICD-10-CM

## 2017-07-05 DIAGNOSIS — I480 Paroxysmal atrial fibrillation: Secondary | ICD-10-CM | POA: Diagnosis not present

## 2017-07-05 DIAGNOSIS — E782 Mixed hyperlipidemia: Secondary | ICD-10-CM | POA: Diagnosis not present

## 2017-07-05 NOTE — Patient Instructions (Signed)
Medication Instructions:  Continue all current medications.  Labwork: none  Testing/Procedures: None   Follow-Up: Your physician wants you to follow up in: 6 months.  You will receive a reminder letter in the mail one-two months in advance.  If you don't receive a letter, please call our office to schedule the follow up appointment.  Any Other Special Instructions Will Be Listed Below (If Applicable).  If you need a refill on your cardiac medications before your next appointment, please call your pharmacy.  

## 2017-07-22 ENCOUNTER — Other Ambulatory Visit: Payer: Medicare Other | Admitting: Orthotics

## 2017-07-22 ENCOUNTER — Ambulatory Visit: Payer: Medicare Other | Admitting: Orthotics

## 2017-07-22 DIAGNOSIS — E1149 Type 2 diabetes mellitus with other diabetic neurological complication: Principal | ICD-10-CM

## 2017-07-22 DIAGNOSIS — D689 Coagulation defect, unspecified: Secondary | ICD-10-CM

## 2017-07-22 DIAGNOSIS — E1140 Type 2 diabetes mellitus with diabetic neuropathy, unspecified: Secondary | ICD-10-CM

## 2017-07-22 DIAGNOSIS — L84 Corns and callosities: Secondary | ICD-10-CM

## 2017-07-22 NOTE — Progress Notes (Signed)
Took material away from arch of diabetic inserts to better accommodate prominent navicular.

## 2017-07-28 DIAGNOSIS — Z1389 Encounter for screening for other disorder: Secondary | ICD-10-CM | POA: Diagnosis not present

## 2017-07-28 DIAGNOSIS — I10 Essential (primary) hypertension: Secondary | ICD-10-CM | POA: Diagnosis not present

## 2017-07-28 DIAGNOSIS — E782 Mixed hyperlipidemia: Secondary | ICD-10-CM | POA: Diagnosis not present

## 2017-07-28 DIAGNOSIS — I480 Paroxysmal atrial fibrillation: Secondary | ICD-10-CM | POA: Diagnosis not present

## 2017-07-28 DIAGNOSIS — E1165 Type 2 diabetes mellitus with hyperglycemia: Secondary | ICD-10-CM | POA: Diagnosis not present

## 2017-07-28 DIAGNOSIS — K649 Unspecified hemorrhoids: Secondary | ICD-10-CM | POA: Diagnosis not present

## 2017-08-09 ENCOUNTER — Telehealth: Payer: Self-pay | Admitting: Cardiology

## 2017-08-09 NOTE — Telephone Encounter (Signed)
After review of chart, nothing to suggest that our office stopped bumex. Patient contacted and asked who stopped bumex. Patient said she think it may have been her family doctor and that it was stopped because she wasn't having any problems with swelling. Patient advised to contact her PCP with this request since their office would have documentation as to why her bumex was stopped. Verbalized understanding.

## 2017-08-23 ENCOUNTER — Telehealth: Payer: Self-pay

## 2017-08-23 MED ORDER — DILTIAZEM HCL ER COATED BEADS 120 MG PO CP24: 120.0000 mg | ORAL_CAPSULE | Freq: Two times a day (BID) | ORAL | 0 refills | Status: DC

## 2017-08-23 NOTE — Telephone Encounter (Signed)
Patient notified and verbalized understanding. 30 day supply sent to [facility].

## 2017-08-23 NOTE — Telephone Encounter (Signed)
Patient states PCP started her on bumetanide 2 mg take 1/2 tablet daily. Patient states swelling is in her feet and this is ongoing for about 2-3 weeks. Patient reports no shortness of breath. Denies any other symptoms. Patient states she does elevate feet when possible. Patient denies any weight gain. Patient states this medication is making her go to the bathroom but no change in swelling. Will forward to provider for further recommendation.

## 2017-08-23 NOTE — Telephone Encounter (Signed)
I would probably try to avoid diuretics in her case since she is on Tikosyn and we need to avoid hypokalemia and hypomagnesemia.  I wonder whether her Cardizem CD 180 mg BID is leading to some of the swelling.  We could consider trying to cut back on the dose from 180 mg twice daily to 120 mg twice daily.

## 2017-08-25 ENCOUNTER — Telehealth: Payer: Self-pay | Admitting: Cardiology

## 2017-08-25 NOTE — Telephone Encounter (Signed)
Patient called stating that her ankles and legs continue to swell.  Patient wants to know if she needs to be seen or follow up with her PCP.

## 2017-08-25 NOTE — Telephone Encounter (Signed)
Recently decreased the Cardizem back to 120mg  twice a day from the 180mg  twice a day.  Has been 2-3 days and do not see any improvement.  No chest pain, SOB, or dizziness.  No weight gain.  Swelling only from knees down, been going on x 3-4 weeks.  She questions if should see pmd or come to you.  Message sent to provider for further advice.

## 2017-08-25 NOTE — Telephone Encounter (Signed)
Left message to return call 

## 2017-08-26 NOTE — Telephone Encounter (Signed)
Patient notified.  OV scheduled for 09/16/2017 with Maude Leriche, PA in our [place] office.

## 2017-08-26 NOTE — Telephone Encounter (Signed)
Would give it more time to see if there will be further improvement.  We can certainly schedule a visit for her to be seen if needed (me or APP).

## 2017-08-31 ENCOUNTER — Other Ambulatory Visit: Payer: Self-pay | Admitting: Internal Medicine

## 2017-09-07 ENCOUNTER — Other Ambulatory Visit: Payer: Self-pay | Admitting: *Deleted

## 2017-09-07 MED ORDER — DILTIAZEM HCL ER COATED BEADS 120 MG PO CP24: 120.0000 mg | ORAL_CAPSULE | Freq: Two times a day (BID) | ORAL | 3 refills | Status: DC

## 2017-09-15 NOTE — Progress Notes (Signed)
Cardiology Office Note    Date:  09/16/2017   ID:  [name] [name], DOB 1945-09-30, MRN [id number]  PCP:  Curlene Labrum, MD  Cardiologist: Rozann Lesches, MD    Chief Complaint  Patient presents with  . Follow-up    lower extremity edema    History of Present Illness:    [name] [name] is a 72 y.o. female with past medical history of CAD (s/p BMS to LCx in 2008), paroxysmal atrial fibrillation (on Xarelto for anticoagulation), Tachy-brady syndrome (s/p Medtronic PPM placement in 06/2009), HLD, and Type 2 DM who presents to the office today for evaluation of worsening edema.   She was last examined by Dr. Domenic Polite in 06/2017 and denied any recent chest pain or dyspnea on exertion at that time. Was continued on her current medication regimen and informed to follow-up in 6 months. In the interim, the patient called the office on 08/23/2017 reporting that her PCP has started her on Bumex 1 mg daily for worsening swelling in her feet over the past 2 to 3 weeks. Denied any recent changes in her respiratory status or associated weight gain. This was reviewed with Dr. Domenic Polite who thought that Cardizem CD might be contributing to her worsening swelling and it was recommended to reduce her dose from 180 mg BID to 120 mg BID. Her symptoms persisted despite this medication adjustment, therefore a follow-up visit was arranged.  In talking with the patient today, she reports having worsening lower extremity edema over the past month. She did take Bumex for a few days as outlined above with no improvement in her symptoms. Symptoms also did not improve with changes in Cardizem dosing. HR and BP have become elevated with reduced dosing.  She has noticed an associated 7- 8 pound weight gain over the past month on her home scales. Denies any associated dyspnea on exertion, orthopnea, or PND.  No recent chest pain or palpitations. She reports good compliance with her current medication regimen, including  Xarelto. Denies any evidence of active bleeding.  She does not add salt to food at home but does eat out at restaurants 2-3 times per week.   Past Medical History:  Diagnosis Date  . Atrial fibrillation ([place])   . Atrial flutter ([place])   . Coronary atherosclerosis of native coronary artery    BMS to circumflex 9/08  . Hyperlipidemia   . Multiple thyroid nodules   . 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 Type 2 diabetes mellitus ([place])     Past Surgical History:  Procedure Laterality Date  . BREAST LUMPECTOMY    . PACEMAKER PLACEMENT     Medtronic  . ROTATOR CUFF REPAIR    . VENTRAL HERNIA REPAIR    . VESICOVAGINAL FISTULA CLOSURE W/ TAH      Current Medications: Outpatient Medications Prior to Visit  Medication Sig Dispense Refill  . canagliflozin (INVOKANA) 300 MG TABS tablet Take 300 mg by mouth daily before breakfast.    . docusate sodium (COLACE) 100 MG capsule Take 100 mg by mouth daily.    Marland Kitchen dofetilide (TIKOSYN) 500 MCG capsule TAKE 1 CAPSULE BY MOUTH TWO TIMES DAILY 180 capsule 2  . glipiZIDE (GLUCOTROL) 10 MG tablet Take 10 mg by mouth 2 (two) times daily before a meal.    . nitroGLYCERIN (NITROSTAT) 0.4 MG SL tablet Place 1 tablet (0.4 mg total) under the tongue every 5 (five) minutes x 3 doses as needed for chest pain (  if no relief after 3 rd dose, proceed to ED for an evaluation). 75 tablet 3  . Omega-3 Fatty Acids (FISH OIL) 1000 MG CAPS Take by mouth.    . pravastatin (PRAVACHOL) 40 MG tablet TAKE 1 TABLET BY MOUTH  EVERY EVENING 90 tablet 3  . saxagliptin HCl (ONGLYZA) 5 MG TABS tablet Take 5 mg by mouth daily.    Marland Kitchen senna (SENOKOT) 8.6 MG tablet Take 1 tablet by mouth daily.    [name] 20 MG TABS tablet TAKE 1 TABLET BY MOUTH  DAILY 90 tablet 3  . diltiazem (CARDIZEM CD) 120 MG 24 hr capsule Take 1 capsule (120 mg total) by mouth 2 (two) times daily. 180 capsule 3   No facility-administered medications prior to visit.      Allergies:   Codeine;  Lisinopril; and Metformin and related   Social History   Socioeconomic History  . Marital status: Married    Spouse name: Not on file  . Number of children: Not on file  . Years of education: Not on file  . Highest education level: Not on file  Occupational History  . Not on file  Social Needs  . Financial resource strain: Not on file  . Food insecurity:    Worry: Not on file    Inability: Not on file  . Transportation needs:    Medical: Not on file    Non-medical: Not on file  Tobacco Use  . Smoking status: Never Smoker  . Smokeless tobacco: Never Used  Substance and Sexual Activity  . Alcohol use: No    Alcohol/week: 0.0 oz  . Drug use: Not on file  . Sexual activity: Not on file  Lifestyle  . Physical activity:    Days per week: Not on file    Minutes per session: Not on file  . Stress: Not on file  Relationships  . Social connections:    Talks on phone: Not on file    Gets together: Not on file    Attends religious service: Not on file    Active member of club or organization: Not on file    Attends meetings of clubs or organizations: Not on file    Relationship status: Not on file  Other Topics Concern  . Not on file  Social History Narrative  . Not on file     Family History:  The patient's family history includes Heart disease in her father and other.   Review of Systems:   Please see the history of present illness.     General:  No chills, fever, night sweats or weight changes.  Cardiovascular:  No chest pain, dyspnea on exertion, orthopnea, palpitations, paroxysmal nocturnal dyspnea. Positive for edema.  Dermatological: No rash, lesions/masses Respiratory: No cough, dyspnea Urologic: No hematuria, dysuria Abdominal:   No nausea, vomiting, diarrhea, bright red blood per rectum, melena, or hematemesis Neurologic:  No visual changes, wkns, changes in mental status. All other systems reviewed and are otherwise negative except as noted  above.   Physical Exam:    VS:  BP (!) 146/82   Pulse 91   Ht 5\' 3"  (1.6 m)   Wt 186 lb 3.2 oz (84.5 kg)   SpO2 94%   BMI 32.98 kg/m    General: Well developed, well nourished Caucasian female appearing in no acute distress. Head: Normocephalic, atraumatic, sclera non-icteric, no xanthomas, nares are without discharge.  Neck: No carotid bruits. JVD not elevated.  Lungs: Respirations regular and unlabored, without  wheezes or rales.  Heart: Regular rate and rhythm. No S3 or S4.  No murmur, no rubs, or gallops appreciated. Abdomen: Soft, non-tender, non-distended with normoactive bowel sounds. No hepatomegaly. No rebound/guarding. No obvious abdominal masses. Msk:  Strength and tone appear normal for age. No joint deformities or effusions. Extremities: No clubbing or cyanosis. 1+ pitting edema bilaterally.  Distal pedal pulses are 2+ bilaterally. Neuro: Alert and oriented X 3. Moves all extremities spontaneously. No focal deficits noted. Psych:  Responds to questions appropriately with a normal affect. Skin: No rashes or lesions noted  Wt Readings from Last 3 Encounters:  09/16/17 186 lb 3.2 oz (84.5 kg)  07/05/17 184 lb (83.5 kg)  06/18/17 176 lb (79.8 kg)     Studies/Labs Reviewed:   EKG:  EKG is not ordered today.   Recent Labs: No results found for requested labs within last 8760 hours.   Lipid Panel    Component Value Date/Time   CHOL  03/18/2009 0548    88        ATP III CLASSIFICATION:  <200     mg/dL   Desirable  200-239  mg/dL   Borderline High  >=240    mg/dL   High          TRIG 65 03/18/2009 0548   HDL 21 (L) 03/18/2009 0548   CHOLHDL 4.2 03/18/2009 0548   VLDL 13 03/18/2009 0548   LDLCALC  03/18/2009 0548    54        Total Cholesterol/HDL:CHD Risk Coronary Heart Disease Risk Table                     Men   Women  1/2 Average Risk   3.4   3.3  Average Risk       5.0   4.4  2 X Average Risk   9.6   7.1  3 X Average Risk  23.4   11.0        Use the  calculated Patient Ratio above and the CHD Risk Table to determine the patient's CHD Risk.        ATP III CLASSIFICATION (LDL):  <100     mg/dL   Optimal  100-129  mg/dL   Near or Above                    Optimal  130-159  mg/dL   Borderline  160-189  mg/dL   High  >190     mg/dL   Very High    Additional studies/ records that were reviewed today include:   Echocardiogram: 10/2012 Study Conclusions  - Left ventricle: The cavity size was normal. There was mild concentric hypertrophy. Systolic function was normal. The estimated ejection fraction was in the range of 60% to 65%. Wall motion was normal; there were no regional wall motion abnormalities. Doppler parameters are consistent with abnormal left ventricular relaxation (grade 1 diastolic dysfunction). - Mitral valve: Calcified annulus. No significant regurgitation. - Left atrium: The atrium was at the upper limits of normal in size. - Right ventricle: Pacer wire or catheter noted in right ventricle. - Right atrium: Central venous pressure: 35mm Hg (est). - Tricuspid valve: Trivial regurgitation. - Pulmonary arteries: PA peak pressure: 93mm Hg (S). - Pericardium, extracardiac: A prominent pericardial fat pad was present. There was no pericardial effusion. Impressions:  - Unable to compare directly with prior study October 2011. Mild LVH with LVEF 51-76%, grade 1 diastolic dysfunction. Upper normal  left atrial size. Device wire in right heart. PASP 33 mmHg.  Assessment:    1. Lower extremity edema   2. Paroxysmal atrial fibrillation (HCC)   3. Tachycardia-bradycardia syndrome ([place])   4. Coronary artery disease involving native coronary artery of native heart without angina pectoris   5. Mixed hyperlipidemia      Plan:   In order of problems listed above:  1. Lower Extremity Edema - She reports worsening lower extremity edema over the past several months which did not improve with  dose reduction of her Cardizem CD. She has noticed a 7- 8 pound weight gain on her home scales. Denies any associated dyspnea on exertion, orthopnea, or PND.  - She does have 1+ pitting edema on examination but lungs are clear.  - Will provide with Rx to take Lasix 20 mg daily for the next 4 days and then stop. She is to call on Monday with her weights. If symptoms have improved but still not at baseline, we will need to recheck a BMET and Mg given her concurrent Tikosyn use. Her last echocardiogram was approximately 5 years ago, therefore will obtain repeat imaging to check structural heart functioning. - We reviewed the importance of a low-sodium diet and limiting fluid to less than 2 L/day. Also encouraged her to utilize compression stockings and elevate her lower extremities when at home.   2. Paroxysmal Atrial Fibrillation - She denies any recent palpitations and heart rate is in the 90's during today's visit. Maintaining normal sinus rhythm by examination. Will plan to increase Cardizem CD to her baseline dosing of 180 mg twice daily given the increase in her HR and BP since discontinuation of this. Continue Tikosyn at current dosing.  - She denies any evidence of active bleeding. Remains on Xarelto 20 mg daily for anticoagulation.  3. Tachy-Brady Syndrome - s/p Medtronic PPM placement in 06/2009. Most recent interrogation in 06/2017 showed normal device function. Followed by [name].   4. CAD - s/p BMS to LCx in 2008. She denies any recent chest pain or dyspnea on exertion. Plan to obtain a repeat echocardiogram as outlined above.  - Continue statin therapy. Not on ASA given the need for anticoagulation.  5. HLD - Followed by PCP. Remains on Pravastatin 40 mg daily.   Medication Adjustments/Labs and Tests Ordered: Current medicines are reviewed at length with the patient today.  Concerns regarding medicines are outlined above.  Medication changes, Labs and Tests ordered today are  listed in the Patient Instructions below. Patient Instructions  Medication Instructions:  Your physician has recommended you make the following change in your medication:  Increase Cardizem to 180 mg Two Times Daily  Start Lasix 20 mg Daily for 4 Days Then STOP.    Labwork: NONE   Testing/Procedures: Your physician has requested that you have an echocardiogram. Echocardiography is a painless test that uses sound waves to create images of your heart. It provides your doctor with information about the size and shape of your heart and how well your heart's chambers and valves are working. This procedure takes approximately one hour. There are no restrictions for this procedure.  Follow-Up: Your physician recommends that you schedule a follow-up appointment in: 2 Months with Dr. Domenic Polite  Any Other Special Instructions Will Be Listed Below (If Applicable). Please Call the Office on Monday and let us know how you are doing.   If you need a refill on your cardiac medications before your next appointment, please call your pharmacy.  Thank you for choosing [facility]!    Signed, Erma Heritage, PA-C  09/16/2017 4:56 PM    [facility] [address] [place], [place] [address] Phone: [phone]

## 2017-09-16 ENCOUNTER — Telehealth: Payer: Self-pay | Admitting: Student

## 2017-09-16 ENCOUNTER — Encounter: Payer: Self-pay | Admitting: Student

## 2017-09-16 ENCOUNTER — Ambulatory Visit (INDEPENDENT_AMBULATORY_CARE_PROVIDER_SITE_OTHER): Payer: Medicare Other | Admitting: Student

## 2017-09-16 VITALS — BP 146/82 | HR 91 | Ht 63.0 in | Wt 186.2 lb

## 2017-09-16 DIAGNOSIS — E782 Mixed hyperlipidemia: Secondary | ICD-10-CM

## 2017-09-16 DIAGNOSIS — I2510 Atherosclerotic heart disease of native coronary artery without angina pectoris: Secondary | ICD-10-CM

## 2017-09-16 DIAGNOSIS — R600 Localized edema: Secondary | ICD-10-CM

## 2017-09-16 DIAGNOSIS — I480 Paroxysmal atrial fibrillation: Secondary | ICD-10-CM | POA: Diagnosis not present

## 2017-09-16 DIAGNOSIS — I495 Sick sinus syndrome: Secondary | ICD-10-CM

## 2017-09-16 MED ORDER — DILTIAZEM HCL ER COATED BEADS 180 MG PO CP24: 180.0000 mg | ORAL_CAPSULE | Freq: Two times a day (BID) | ORAL | 0 refills | Status: DC

## 2017-09-16 MED ORDER — FUROSEMIDE 20 MG PO TABS: 20.0000 mg | ORAL_TABLET | Freq: Every day | ORAL | 0 refills | Status: DC | PRN

## 2017-09-16 MED ORDER — DILTIAZEM HCL ER COATED BEADS 180 MG PO CP24: 180.0000 mg | ORAL_CAPSULE | Freq: Two times a day (BID) | ORAL | 3 refills | Status: DC

## 2017-09-16 NOTE — Telephone Encounter (Signed)
[name] [name] was seen in [place] today. She called [facility] office requesting samples of Diltiazem .

## 2017-09-16 NOTE — Telephone Encounter (Signed)
Spoke with [facility] informed that pt needs rush order on cardizem. [facility] stated that cardizem would be sent in 2-3 days to pt.

## 2017-09-16 NOTE — Patient Instructions (Signed)
Medication Instructions:  Your physician has recommended you make the following change in your medication:  Increase Cardizem to 180 mg Two Times Daily  Start Lasix 20 mg Daily for 4 Days Then STOP.    Labwork: NONE   Testing/Procedures: Your physician has requested that you have an echocardiogram. Echocardiography is a painless test that uses sound waves to create images of your heart. It provides your doctor with information about the size and shape of your heart and how well your heart's chambers and valves are working. This procedure takes approximately one hour. There are no restrictions for this procedure.    Follow-Up: Your physician recommends that you schedule a follow-up appointment in: 2 Months with Dr. Domenic Polite   Any Other Special Instructions Will Be Listed Below (If Applicable). Please Call the Office on Monday and let us know how you are doing.     If you need a refill on your cardiac medications before your next appointment, please call your pharmacy. Thank you for choosing [facility]!

## 2017-09-17 ENCOUNTER — Ambulatory Visit (HOSPITAL_COMMUNITY)
Admission: RE | Admit: 2017-09-17 | Discharge: 2017-09-17 | Disposition: A | Discharge status: Home / Self Care | Payer: Medicare Other | Source: Ambulatory Visit | Attending: Student | Admitting: Student

## 2017-09-17 DIAGNOSIS — E119 Type 2 diabetes mellitus without complications: Secondary | ICD-10-CM | POA: Diagnosis not present

## 2017-09-17 DIAGNOSIS — I480 Paroxysmal atrial fibrillation: Secondary | ICD-10-CM | POA: Insufficient documentation

## 2017-09-17 DIAGNOSIS — E785 Hyperlipidemia, unspecified: Secondary | ICD-10-CM | POA: Insufficient documentation

## 2017-09-17 NOTE — Progress Notes (Signed)
*  PRELIMINARY RESULTS* Echocardiogram 2D Echocardiogram has been performed.  Leavy Cella 09/17/2017, 9:29 AM

## 2017-09-20 ENCOUNTER — Telehealth: Payer: Self-pay | Admitting: Student

## 2017-09-20 NOTE — Telephone Encounter (Signed)
Patient called stating that she thinks the Lasix is working. She was told to contact [place] office.

## 2017-09-22 ENCOUNTER — Other Ambulatory Visit: Payer: Self-pay

## 2017-09-22 MED ORDER — DILTIAZEM HCL ER COATED BEADS 180 MG PO CP24: 180.0000 mg | ORAL_CAPSULE | Freq: Two times a day (BID) | ORAL | 0 refills | Status: DC

## 2017-09-28 ENCOUNTER — Telehealth: Payer: Self-pay | Admitting: Cardiology

## 2017-09-28 NOTE — Telephone Encounter (Signed)
LMTCB

## 2017-09-28 NOTE — Telephone Encounter (Signed)
diltiazem (CARDIZEM CD) 180 MG 24 hr capsule   Please call patient   She is almost out of medication and stated that mail RX should be here by Friday but she will run out tomorrow

## 2017-09-29 MED ORDER — DILTIAZEM HCL ER COATED BEADS 180 MG PO CP24: 180.0000 mg | ORAL_CAPSULE | Freq: Two times a day (BID) | ORAL | 0 refills | Status: DC

## 2017-09-29 NOTE — Telephone Encounter (Signed)
Pt says she will not receive cardizem from mail order pharmacy until 8/20 and requested #[phone] sent to [facility] in [place]. Medication sent to pharmacy.

## 2017-10-02 ENCOUNTER — Other Ambulatory Visit: Payer: Self-pay | Admitting: Cardiology

## 2017-10-27 ENCOUNTER — Ambulatory Visit (INDEPENDENT_AMBULATORY_CARE_PROVIDER_SITE_OTHER): Payer: Medicare Other | Admitting: Podiatry

## 2017-10-27 ENCOUNTER — Encounter: Payer: Self-pay | Admitting: Podiatry

## 2017-10-27 DIAGNOSIS — E1149 Type 2 diabetes mellitus with other diabetic neurological complication: Secondary | ICD-10-CM

## 2017-10-27 DIAGNOSIS — Z23 Encounter for immunization: Secondary | ICD-10-CM | POA: Diagnosis not present

## 2017-10-27 DIAGNOSIS — I10 Essential (primary) hypertension: Secondary | ICD-10-CM | POA: Diagnosis not present

## 2017-10-27 DIAGNOSIS — Z0000 Encounter for general adult medical examination without abnormal findings: Secondary | ICD-10-CM | POA: Diagnosis not present

## 2017-10-27 DIAGNOSIS — D689 Coagulation defect, unspecified: Secondary | ICD-10-CM

## 2017-10-27 DIAGNOSIS — E1140 Type 2 diabetes mellitus with diabetic neuropathy, unspecified: Secondary | ICD-10-CM | POA: Diagnosis not present

## 2017-10-27 DIAGNOSIS — M79674 Pain in right toe(s): Secondary | ICD-10-CM

## 2017-10-27 DIAGNOSIS — L84 Corns and callosities: Secondary | ICD-10-CM

## 2017-10-27 DIAGNOSIS — M79675 Pain in left toe(s): Secondary | ICD-10-CM | POA: Diagnosis not present

## 2017-10-27 DIAGNOSIS — B351 Tinea unguium: Secondary | ICD-10-CM | POA: Diagnosis not present

## 2017-10-27 NOTE — Progress Notes (Addendum)
Complaint:  Visit Type: Patient returns to my office for continued preventative foot care services. Complaint: Patient states" my nails have grown long and thick and become painful to walk and wear shoes".  Patient also has painful callus right foot. Patient has been diagnosed with DM with no foot complications. The patient presents for preventative foot care services. No changes to ROS.  Patient is taking xarelto.  Podiatric Exam: Vascular: dorsalis pedis and posterior tibial pulses are palpable bilateral. Capillary return is immediate. Temperature gradient is WNL. Skin turgor WNL  Sensorium: Normal Semmes Weinstein monofilament test. Normal tactile sensation bilaterally. Nail Exam: Pt has thick disfigured discolored nails with subungual debris noted bilateral entire nail hallux through fifth toenails Ulcer Exam: There is no evidence of ulcer or pre-ulcerative changes or infection. Orthopedic Exam: Muscle tone and strength are WNL. No limitations in general ROM. No crepitus or effusions noted. Foot type and digits show no abnormalities. Bony prominences are unremarkable. Skin: No Porokeratosis. No infection or ulcers.  Multiple porokeratosis  B/L  Asymptomatic.Marland Kitchen  Painful callus sub 3 right and right heel.  Diagnosis:  Onychomycosis, , Pain in right toe, pain in left toes  Callus right foot.  Treatment & Plan Procedures and Treatment: Consent by patient was obtained for treatment procedures.   Debridement of mycotic and hypertrophic toenails, 1 through 5 bilateral and clearing of subungual debris. No ulceration, no infection noted. Debride callus. Return Visit-Office Procedure: Patient instructed to return to the office for a follow up visit 4 months for continued evaluation and treatment.    Gardiner Barefoot DPM

## 2017-10-28 DIAGNOSIS — I10 Essential (primary) hypertension: Secondary | ICD-10-CM | POA: Diagnosis not present

## 2017-10-28 DIAGNOSIS — E782 Mixed hyperlipidemia: Secondary | ICD-10-CM | POA: Diagnosis not present

## 2017-10-28 DIAGNOSIS — E1165 Type 2 diabetes mellitus with hyperglycemia: Secondary | ICD-10-CM | POA: Diagnosis not present

## 2017-10-29 ENCOUNTER — Other Ambulatory Visit: Payer: Self-pay | Admitting: *Deleted

## 2017-10-29 ENCOUNTER — Telehealth: Payer: Self-pay | Admitting: *Deleted

## 2017-10-29 DIAGNOSIS — I10 Essential (primary) hypertension: Secondary | ICD-10-CM

## 2017-10-29 DIAGNOSIS — Z79899 Other long term (current) drug therapy: Secondary | ICD-10-CM

## 2017-10-29 MED ORDER — POTASSIUM CHLORIDE CRYS ER 20 MEQ PO TBCR: 20.0000 meq | EXTENDED_RELEASE_TABLET | Freq: Every day | ORAL | 2 refills | Status: DC

## 2017-10-29 MED ORDER — FUROSEMIDE 20 MG PO TABS: 40.0000 mg | ORAL_TABLET | Freq: Every day | ORAL | 2 refills | Status: DC

## 2017-10-29 NOTE — Telephone Encounter (Signed)
I reviewed the chart since patient was seen by [name] PA-C regarding leg swelling in early August at which point she was placed on as needed Lasix.  She had previously been on Bumex however this was apparently stopped by her PCP.  Recommend that she take Lasix 40 mg daily, would start KCl 20 mEq daily at this time.  She needs to have a BMET with magnesium in 2 weeks.

## 2017-10-29 NOTE — Telephone Encounter (Signed)
Patient c/o both legs and feet are swollen tight and painful. Patient said that her legs are a little more red than normal. Patient has been taking the as needed lasix 20 mg daily but its not helping. Weighed at PCP office on Wednesday this week and was 190 lbs. No c/o dizziness, chest pain or sob.   Patient said she is not taking bumex 2 mg for approximately 3-4 months. Patient said she is unsure which doctor stopped it but it was stopped because she was voiding too much.   Medications reconciled.

## 2017-10-29 NOTE — Telephone Encounter (Signed)
Patient informed and verbalized understanding of plan. Lab work order faxed to [facility]

## 2017-11-12 ENCOUNTER — Telehealth: Payer: Self-pay | Admitting: *Deleted

## 2017-11-12 DIAGNOSIS — I10 Essential (primary) hypertension: Secondary | ICD-10-CM | POA: Diagnosis not present

## 2017-11-12 DIAGNOSIS — Z79899 Other long term (current) drug therapy: Secondary | ICD-10-CM | POA: Diagnosis not present

## 2017-11-12 NOTE — Telephone Encounter (Signed)
Patient informed. Copy sent to PCP °

## 2017-11-12 NOTE — Telephone Encounter (Signed)
-----   Message from Satira Sark, MD sent at 11/12/2017 12:54 PM EDT ----- Results reviewed.  Renal function, potassium, and magnesium levels are normal.  Continue with current medical regimen. A copy of this test should be forwarded to [place], Virgina Evener, MD.

## 2017-11-17 ENCOUNTER — Ambulatory Visit: Payer: Medicare Other | Admitting: Cardiology

## 2017-11-18 DIAGNOSIS — R609 Edema, unspecified: Secondary | ICD-10-CM | POA: Diagnosis not present

## 2017-11-25 DIAGNOSIS — Z1231 Encounter for screening mammogram for malignant neoplasm of breast: Secondary | ICD-10-CM | POA: Diagnosis not present

## 2017-11-29 ENCOUNTER — Other Ambulatory Visit: Payer: Self-pay

## 2017-11-29 DIAGNOSIS — R600 Localized edema: Secondary | ICD-10-CM

## 2017-12-06 NOTE — Progress Notes (Signed)
Cardiology Office Note  Date: 12/07/2017   ID: [name] [name], DOB [birth year], MRN [id number]  PCP: [name] Labrum, MD  Primary Cardiologist: [name] Lesches, MD   Chief Complaint  Patient presents with  . Atrial Fibrillation    History of Present Illness: [name] [name] is a 72 y.o. female last seen by [name] PA-C in August.  She presents today for a routine visit.  Reports no progressive palpitations, has had some atypical, brief, sharp chest pains without precipitant.  She continues to complain of bilateral lower leg edema and a burning discomfort.  She has been scheduled by her PCP for vascular evaluation in [place] next week.  Since last evaluation she has been placed on standing diuretic, Lasix 40 mg daily with potassium supplements.  Follow-up lab work is outlined below.  She does not report any major change in leg swelling, although it does dissipate when she puts her legs up or in the morning after she has been in bed.  Also better with compression stockings, but she is not using these regularly.  LVEF is normal by recent echocardiogram.  She sees Dr. Rayann [name] with follow-up in the device clinic, Medtronic pacemaker in place.  I personally reviewed her ECG today which shows an atrial paced rhythm with leftward axis and nonspecific T wave changes.  Past Medical History:  Diagnosis Date  . Atrial fibrillation ([place])   . Atrial flutter ([place])   . Coronary atherosclerosis of native coronary artery    BMS to circumflex 9/08  . Hyperlipidemia   . Multiple thyroid nodules   . 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 Type 2 diabetes mellitus ([place])     Past Surgical History:  Procedure Laterality Date  . BREAST LUMPECTOMY    . PACEMAKER PLACEMENT     Medtronic  . ROTATOR CUFF REPAIR    . VENTRAL HERNIA REPAIR    . VESICOVAGINAL FISTULA CLOSURE W/ TAH      Current Outpatient Medications  Medication Sig Dispense Refill  . canagliflozin (INVOKANA) 300 MG  TABS tablet Take 300 mg by mouth daily before breakfast.    . diltiazem (CARDIZEM CD) 180 MG 24 hr capsule Take 1 capsule (180 mg total) by mouth 2 (two) times daily. 30 capsule 0  . docusate sodium (COLACE) 100 MG capsule Take 100 mg by mouth daily.    Marland Kitchen dofetilide (TIKOSYN) 500 MCG capsule TAKE 1 CAPSULE BY MOUTH TWO TIMES DAILY 180 capsule 2  . furosemide (LASIX) 20 MG tablet Take 2 tablets (40 mg total) by mouth daily. 60 tablet 2  . glipiZIDE (GLUCOTROL) 10 MG tablet Take 10 mg by mouth 2 (two) times daily before a meal.    . nitroGLYCERIN (NITROSTAT) 0.4 MG SL tablet Place 1 tablet (0.4 mg total) under the tongue every 5 (five) minutes x 3 doses as needed for chest pain (if no relief after 3 rd dose, proceed to ED for an evaluation). 75 tablet 3  . potassium chloride SA (K-DUR,KLOR-CON) 20 MEQ tablet Take 1 tablet (20 mEq total) by mouth daily. 30 tablet 2  . pravastatin (PRAVACHOL) 40 MG tablet TAKE 1 TABLET BY MOUTH  EVERY EVENING 90 tablet 3  . saxagliptin HCl (ONGLYZA) 5 MG TABS tablet Take 5 mg by mouth daily.    Marland Kitchen senna (SENOKOT) 8.6 MG tablet Take 1 tablet by mouth daily.    [name] 20 MG TABS tablet TAKE 1 TABLET BY MOUTH  DAILY 90  tablet 3   No current facility-administered medications for this visit.    Allergies:  Codeine; Lisinopril; and Metformin and related   Social History: The patient  reports that she has never smoked. She has never used smokeless tobacco. She reports that she does not drink alcohol.   ROS:  Please see the history of present illness. Otherwise, complete review of systems is positive for none.  All other systems are reviewed and negative.   Physical Exam: VS:  BP (!) 142/78   Pulse 78   Ht 5' 3.5" (1.613 m)   Wt 189 lb (85.7 kg)   SpO2 98%   BMI 32.95 kg/m , BMI Body mass index is 32.95 kg/m.  Wt Readings from Last 3 Encounters:  12/07/17 189 lb (85.7 kg)  09/16/17 186 lb 3.2 oz (84.5 kg)  07/05/17 184 lb (83.5 kg)    General: Patient  appears comfortable at rest. HEENT: Conjunctiva and lids normal, oropharynx clear. Neck: Supple, no elevated JVP or carotid bruits, no thyromegaly. Lungs: Clear to auscultation, nonlabored breathing at rest. Cardiac: Regular rate and rhythm, no S3 or significant systolic murmur. Abdomen: Soft, nontender, bowel sounds present. Extremities: Lower leg edema and venous stasis, symmetrical, diminished distal pulses.. Skin: Warm and dry. Musculoskeletal: No kyphosis. Neuropsychiatric: Alert and oriented x3, affect grossly appropriate.  ECG: I personally reviewed the tracing from 06/18/2017 which showed an atrial paced rhythm with nonspecific T wave changes.  Recent Labwork:  September 2019: BUN 16, creatinine 0.66, potassium 4.5, magnesium 2.2  Other Studies Reviewed Today:  Echocardiogram 09/17/2017: Study Conclusions  - Left ventricle: The cavity size was normal. Wall thickness was   increased in a pattern of moderate LVH. Systolic function was   normal. The estimated ejection fraction was in the range of 60%   to 65%. Wall motion was normal; there were no regional wall   motion abnormalities. The study is not technically sufficient to   allow evaluation of LV diastolic function. - Aortic valve: Valve area (VTI): 3.43 cm^2. Valve area (Vmax):   3.06 cm^2.  Assessment and Plan:  1.  Bilateral lower extremity edema and venous stasis.  She did not experience improvement when we cut back calcium channel blocker therapy, Lasix has not resolved symptoms.  She does feel better when she puts her feet up or use compression stockings, but has not been using this regularly.  LVEF is normal by recent echocardiogram.  Also complains of a burning, neuropathic pain.  She has pending vascular evaluation next week in [place].  2.  Paroxysmal atrial fibrillation.  She continues on Tikosyn and Xarelto with low rhythm burden.  Lab work reviewed on diuretic.  3.  CAD with BMS to the circumflex.  No  active angina symptoms.  Continue statin therapy.  4.  Tachycardia-bradycardia syndrome, Medtronic pacemaker in place with follow-up per Dr. Rayann [name].  Current medicines were reviewed with the patient today.   Orders Placed This Encounter  Procedures  . EKG 12-Lead    Disposition: Follow-up in 3 months.   Signed, Satira Sark, MD, [facility] 12/07/2017 12:14 PM    [facility] [place], [place], [place] [address] Phone: [phone]; Fax: [phone]

## 2017-12-07 ENCOUNTER — Encounter: Payer: Self-pay | Admitting: Cardiology

## 2017-12-07 ENCOUNTER — Ambulatory Visit (INDEPENDENT_AMBULATORY_CARE_PROVIDER_SITE_OTHER): Payer: Medicare Other | Admitting: Cardiology

## 2017-12-07 VITALS — BP 142/78 | HR 78 | Ht 63.5 in | Wt 189.0 lb

## 2017-12-07 DIAGNOSIS — I2510 Atherosclerotic heart disease of native coronary artery without angina pectoris: Secondary | ICD-10-CM

## 2017-12-07 DIAGNOSIS — R600 Localized edema: Secondary | ICD-10-CM | POA: Diagnosis not present

## 2017-12-07 DIAGNOSIS — I480 Paroxysmal atrial fibrillation: Secondary | ICD-10-CM

## 2017-12-07 DIAGNOSIS — I495 Sick sinus syndrome: Secondary | ICD-10-CM

## 2017-12-07 NOTE — Patient Instructions (Addendum)
Medication Instructions:   Your physician recommends that you continue on your current medications as directed. Please refer to the Current Medication list given to you today.  Labwork:  NONE  Testing/Procedures:  NONE  Follow-Up:  Your physician recommends that you schedule a follow-up appointment in: 3 months.  Any Other Special Instructions Will Be Listed Below (If Applicable).  If you need a refill on your cardiac medications before your next appointment, please call your pharmacy. 

## 2017-12-10 ENCOUNTER — Other Ambulatory Visit: Payer: Self-pay | Admitting: Cardiology

## 2017-12-16 DIAGNOSIS — I8312 Varicose veins of left lower extremity with inflammation: Secondary | ICD-10-CM | POA: Diagnosis not present

## 2017-12-16 DIAGNOSIS — I83893 Varicose veins of bilateral lower extremities with other complications: Secondary | ICD-10-CM | POA: Diagnosis not present

## 2017-12-16 DIAGNOSIS — I83813 Varicose veins of bilateral lower extremities with pain: Secondary | ICD-10-CM | POA: Diagnosis not present

## 2017-12-16 DIAGNOSIS — I8311 Varicose veins of right lower extremity with inflammation: Secondary | ICD-10-CM | POA: Diagnosis not present

## 2017-12-22 DIAGNOSIS — I8312 Varicose veins of left lower extremity with inflammation: Secondary | ICD-10-CM | POA: Diagnosis not present

## 2017-12-22 DIAGNOSIS — I8311 Varicose veins of right lower extremity with inflammation: Secondary | ICD-10-CM | POA: Diagnosis not present

## 2018-01-06 DIAGNOSIS — E11319 Type 2 diabetes mellitus with unspecified diabetic retinopathy without macular edema: Secondary | ICD-10-CM | POA: Diagnosis not present

## 2018-01-11 ENCOUNTER — Other Ambulatory Visit: Payer: Self-pay | Admitting: Cardiology

## 2018-01-12 DIAGNOSIS — I83813 Varicose veins of bilateral lower extremities with pain: Secondary | ICD-10-CM | POA: Diagnosis not present

## 2018-01-12 DIAGNOSIS — I8311 Varicose veins of right lower extremity with inflammation: Secondary | ICD-10-CM | POA: Diagnosis not present

## 2018-01-12 DIAGNOSIS — S39012A Strain of muscle, fascia and tendon of lower back, initial encounter: Secondary | ICD-10-CM | POA: Diagnosis not present

## 2018-01-12 DIAGNOSIS — I8312 Varicose veins of left lower extremity with inflammation: Secondary | ICD-10-CM | POA: Diagnosis not present

## 2018-01-17 DIAGNOSIS — I480 Paroxysmal atrial fibrillation: Secondary | ICD-10-CM | POA: Diagnosis not present

## 2018-01-17 DIAGNOSIS — M545 Low back pain: Secondary | ICD-10-CM | POA: Diagnosis not present

## 2018-01-17 DIAGNOSIS — E1165 Type 2 diabetes mellitus with hyperglycemia: Secondary | ICD-10-CM | POA: Diagnosis not present

## 2018-01-17 DIAGNOSIS — E782 Mixed hyperlipidemia: Secondary | ICD-10-CM | POA: Diagnosis not present

## 2018-01-17 DIAGNOSIS — I10 Essential (primary) hypertension: Secondary | ICD-10-CM | POA: Diagnosis not present

## 2018-01-20 ENCOUNTER — Encounter

## 2018-01-20 ENCOUNTER — Encounter (HOSPITAL_COMMUNITY): Payer: Medicare Other

## 2018-01-20 ENCOUNTER — Encounter: Payer: Medicare Other | Admitting: Vascular Surgery

## 2018-02-08 DIAGNOSIS — J019 Acute sinusitis, unspecified: Secondary | ICD-10-CM | POA: Diagnosis not present

## 2018-02-08 DIAGNOSIS — J209 Acute bronchitis, unspecified: Secondary | ICD-10-CM | POA: Diagnosis not present

## 2018-02-17 ENCOUNTER — Encounter: Payer: Self-pay | Admitting: Nurse Practitioner

## 2018-02-17 ENCOUNTER — Ambulatory Visit (INDEPENDENT_AMBULATORY_CARE_PROVIDER_SITE_OTHER): Payer: Medicare Other | Admitting: Nurse Practitioner

## 2018-02-17 VITALS — BP 140/72 | HR 99 | Resp 95 | Ht 63.5 in

## 2018-02-17 DIAGNOSIS — I8311 Varicose veins of right lower extremity with inflammation: Secondary | ICD-10-CM | POA: Diagnosis not present

## 2018-02-17 DIAGNOSIS — I495 Sick sinus syndrome: Secondary | ICD-10-CM

## 2018-02-17 DIAGNOSIS — I83893 Varicose veins of bilateral lower extremities with other complications: Secondary | ICD-10-CM | POA: Diagnosis not present

## 2018-02-17 DIAGNOSIS — I8312 Varicose veins of left lower extremity with inflammation: Secondary | ICD-10-CM | POA: Diagnosis not present

## 2018-02-17 LAB — CUP PACEART INCLINIC DEVICE CHECK
Implantable Lead Implant Date: 20110510
Implantable Lead Location: 753859
Implantable Lead Model: 5092
Implantable Pulse Generator Implant Date: 20110510
MDC IDC LEAD IMPLANT DT: 20110510
MDC IDC LEAD LOCATION: 753860
MDC IDC SESS DTM: 20200102104535

## 2018-02-17 NOTE — Progress Notes (Signed)
Pacemaker check in clinic. Normal device function. Thresholds, sensing, impedances consistent with previous measurements. Device programmed to maximize longevity.  Device programmed at appropriate safety margins. Histogram distribution appropriate for patient activity level. Device programmed to optimize intrinsic conduction. Patient is unable to do remotes 2/2 poor cell service. Plan ROV in 6 months with JA in [place]. Patient education completed.

## 2018-02-21 ENCOUNTER — Ambulatory Visit (INDEPENDENT_AMBULATORY_CARE_PROVIDER_SITE_OTHER): Payer: Medicare Other | Admitting: Cardiology

## 2018-02-21 ENCOUNTER — Encounter: Payer: Self-pay | Admitting: Cardiology

## 2018-02-21 VITALS — BP 144/71 | HR 65 | Ht 63.0 in | Wt 179.8 lb

## 2018-02-21 DIAGNOSIS — I495 Sick sinus syndrome: Secondary | ICD-10-CM | POA: Diagnosis not present

## 2018-02-21 DIAGNOSIS — I480 Paroxysmal atrial fibrillation: Secondary | ICD-10-CM

## 2018-02-21 DIAGNOSIS — I25119 Atherosclerotic heart disease of native coronary artery with unspecified angina pectoris: Secondary | ICD-10-CM | POA: Diagnosis not present

## 2018-02-21 DIAGNOSIS — E782 Mixed hyperlipidemia: Secondary | ICD-10-CM | POA: Diagnosis not present

## 2018-02-21 NOTE — Patient Instructions (Signed)
Your physician wants you to follow-up in: [place] will receive a reminder letter in the mail two months in advance. If you don't receive a letter, please call our office to schedule the follow-up appointment.  Your physician recommends that you continue on your current medications as directed. Please refer to the Current Medication list given to you today.  Your physician recommends that you return for lab work [place] NEXT APPOINTMENT - [place] [facility] [place]  Thank you for choosing [facility]!!

## 2018-02-21 NOTE — Progress Notes (Signed)
Cardiology Office Note  Date: 02/21/2018   ID: [name] [name], DOB March 10, 1945, MRN [id number]  PCP: Curlene Labrum, MD  Primary Cardiologist: [name] Lesches, MD   Chief Complaint  Patient presents with  . Atrial Fibrillation    History of Present Illness: [name] [name] is a 73 y.o. female last seen in October 2019.  She is here for a routine visit.  She does not report any significant palpitations or chest pain since last assessment.  Recently getting over a bout of bronchitis.  She sees Dr. Rayann [name] in the device clinic, Medtronic pacemaker in place.  I reviewed her medications.  She remains on Xarelto without reported bleeding problems.  She is also on Tikosyn and Cardizem CD.  I reviewed her lab work from September.  Past Medical History:  Diagnosis Date  . Atrial fibrillation ([name] [name])   . Atrial flutter ([name] [name])   . Coronary atherosclerosis of native coronary artery    BMS to circumflex 9/08  . Hyperlipidemia   . Multiple thyroid nodules   . 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 Type 2 diabetes mellitus ([name] [name])     Past Surgical History:  Procedure Laterality Date  . BREAST LUMPECTOMY    . PACEMAKER PLACEMENT     Medtronic  . ROTATOR CUFF REPAIR    . VENTRAL HERNIA REPAIR    . VESICOVAGINAL FISTULA CLOSURE W/ TAH      Current Outpatient Medications  Medication Sig Dispense Refill  . diltiazem (CARDIZEM CD) 180 MG 24 hr capsule Take 1 capsule (180 mg total) by mouth 2 (two) times daily. 30 capsule 0  . docusate sodium (COLACE) 100 MG capsule Take 100 mg by mouth daily.    Marland Kitchen dofetilide (TIKOSYN) 500 MCG capsule TAKE 1 CAPSULE BY MOUTH TWO TIMES DAILY 180 capsule 2  . glipiZIDE (GLUCOTROL) 10 MG tablet Take 10 mg by mouth 2 (two) times daily before a meal.    . nitroGLYCERIN (NITROSTAT) 0.4 MG SL tablet Place 1 tablet (0.4 mg total) under the tongue every 5 (five) minutes x 3 doses as needed for chest pain (if no relief after 3 rd dose, proceed to ED for an  evaluation). 75 tablet 3  . potassium chloride SA (K-DUR,KLOR-CON) 20 MEQ tablet TAKE 1 TABLET BY MOUTH  EVERY OTHER DAY 45 tablet 3  . pravastatin (PRAVACHOL) 40 MG tablet TAKE 1 TABLET BY MOUTH  EVERY EVENING 90 tablet 3  . sitaGLIPtin (JANUVIA) 25 MG tablet Take 25 mg by mouth daily.    [name] 20 MG TABS tablet TAKE 1 TABLET BY MOUTH  DAILY 90 tablet 3  . furosemide (LASIX) 20 MG tablet Take 2 tablets (40 mg total) by mouth daily. (Patient not taking: Reported on 02/21/2018) 60 tablet 2   No current facility-administered medications for this visit.    Allergies:  Codeine; Lisinopril; and Metformin and related   Social History: The patient  reports that she has never smoked. She has never used smokeless tobacco. She reports that she does not drink alcohol.   ROS:  Please see the history of present illness. Otherwise, complete review of systems is positive for improved leg swelling.  All other systems are reviewed and negative.   Physical Exam: VS:  BP (!) 144/71   Pulse 65   Ht 5\' 3"  (1.6 m)   Wt 179 lb 12.8 oz (81.6 kg)   SpO2 95%   BMI 31.85 kg/m , BMI Body mass index  is 31.85 kg/m.  Wt Readings from Last 3 Encounters:  02/21/18 179 lb 12.8 oz (81.6 kg)  12/07/17 189 lb (85.7 kg)  09/16/17 186 lb 3.2 oz (84.5 kg)    General: Patient appears comfortable at rest. HEENT: Conjunctiva and lids normal, oropharynx clear. Neck: Supple, no elevated JVP or carotid bruits, no thyromegaly. Lungs: Clear to auscultation, nonlabored breathing at rest. Cardiac: Regular rate and rhythm, no S3 or significant systolic murmur. Abdomen: Soft, nontender, bowel sounds present, no guarding or rebound. Extremities: Mild lower leg edema, distal pulses 2+. Skin: Warm and dry. Musculoskeletal: No kyphosis. Neuropsychiatric: Alert and oriented x3, affect grossly appropriate.  ECG: I personally reviewed the tracing from 12/07/2017 which showed an atrial paced rhythm with leftward axis and nonspecific  T wave changes.  Recent Labwork:  September 2019: BUN 16, creatinine 0.66, potassium 4.5, magnesium 2.2  Other Studies Reviewed Today:  Echocardiogram 09/17/2017: Study Conclusions  - Left ventricle: The cavity size was normal. Wall thickness was increased in a pattern of moderate LVH. Systolic function was normal. The estimated ejection fraction was in the range of 60% to 65%. Wall motion was normal; there were no regional wall motion abnormalities. The study is not technically sufficient to allow evaluation of LV diastolic function. - Aortic valve: Valve area (VTI): 3.43 cm^2. Valve area (Vmax): 3.06 cm^2.  Assessment and Plan:  1.  Paroxysmal atrial fibrillation.  She is doing well without active symptoms and remains on Tikosyn, Cardizem CD, and Xarelto.  Follow-up CBC, BMET, and magnesium for next visit.  2.  CAD status post BMS to the circumflex.  No active angina symptoms.  She is not on aspirin with concurrent use of Xarelto.  Continues on statin.  3.  Mixed hyperlipidemia on Pravachol.  She follows with Dr. Pleas [name] [name].  4.  Tachycardia-bradycardia syndrome status post Medtronic pacemaker.  Follow-up with Dr. Rayann [name].  Current medicines were reviewed with the patient today.   Orders Placed This Encounter  Procedures  . Basic Metabolic Panel (BMET)  . CBC  . Magnesium    Disposition: Follow-up in 6 months.  Signed, Satira Sark, MD, [facility] 02/21/2018 3:32 PM    [facility] [place], [place], [place] [address] Phone: [phone]; Fax: [phone]

## 2018-02-22 ENCOUNTER — Telehealth: Payer: Self-pay | Admitting: Cardiology

## 2018-02-22 NOTE — Telephone Encounter (Signed)
Patient called office to give correct dose of januvia which is 50 mg instead of 25 mg. Medication profile corrected.

## 2018-02-22 NOTE — Telephone Encounter (Signed)
Patient called stating that she was returning call to [facility].

## 2018-02-23 ENCOUNTER — Ambulatory Visit: Payer: Medicare Other | Admitting: Podiatry

## 2018-03-09 ENCOUNTER — Telehealth: Payer: Self-pay | Admitting: Cardiology

## 2018-03-09 NOTE — Telephone Encounter (Signed)
Okay from a cardiac perspective for her to take the alprazolam as directed prior to surgery.  Please obtain clearance form otherwise.

## 2018-03-09 NOTE — Telephone Encounter (Signed)
Patient called stating that she is having a surgical procedure with [facility] in February. Patient has questions about her medications.

## 2018-03-09 NOTE — Telephone Encounter (Signed)
Pt scheduled for Vein surgery on L leg 03/29/18 and was given Alprazolam to take prior to surgery and wanted to know if was ok to take with her heart medications - also advised pt that surgical center would need to fax a clearance form for review regarding Xarelto and device. Pt voiced understanding

## 2018-03-09 NOTE — Telephone Encounter (Signed)
Pt voiced understanding and says she spoke with nurse at surgical center and was told she didn't need clearance or hold Xarelto as per surgeons instructions.

## 2018-03-22 DIAGNOSIS — R509 Fever, unspecified: Secondary | ICD-10-CM | POA: Diagnosis not present

## 2018-04-18 DIAGNOSIS — I10 Essential (primary) hypertension: Secondary | ICD-10-CM | POA: Diagnosis not present

## 2018-04-18 DIAGNOSIS — E1165 Type 2 diabetes mellitus with hyperglycemia: Secondary | ICD-10-CM | POA: Diagnosis not present

## 2018-04-18 DIAGNOSIS — E782 Mixed hyperlipidemia: Secondary | ICD-10-CM | POA: Diagnosis not present

## 2018-04-18 DIAGNOSIS — I480 Paroxysmal atrial fibrillation: Secondary | ICD-10-CM | POA: Diagnosis not present

## 2018-04-18 DIAGNOSIS — M545 Low back pain: Secondary | ICD-10-CM | POA: Diagnosis not present

## 2018-04-22 DIAGNOSIS — I480 Paroxysmal atrial fibrillation: Secondary | ICD-10-CM | POA: Diagnosis not present

## 2018-04-22 DIAGNOSIS — E782 Mixed hyperlipidemia: Secondary | ICD-10-CM | POA: Diagnosis not present

## 2018-04-22 DIAGNOSIS — E1165 Type 2 diabetes mellitus with hyperglycemia: Secondary | ICD-10-CM | POA: Diagnosis not present

## 2018-04-22 DIAGNOSIS — I10 Essential (primary) hypertension: Secondary | ICD-10-CM | POA: Diagnosis not present

## 2018-04-28 DIAGNOSIS — D751 Secondary polycythemia: Secondary | ICD-10-CM | POA: Insufficient documentation

## 2018-04-28 DIAGNOSIS — R718 Other abnormality of red blood cells: Secondary | ICD-10-CM | POA: Diagnosis not present

## 2018-06-16 ENCOUNTER — Telehealth: Payer: Self-pay | Admitting: Internal Medicine

## 2018-06-16 ENCOUNTER — Telehealth: Payer: Self-pay

## 2018-06-16 NOTE — Telephone Encounter (Signed)
Spoke with pt regarding appt on 06/17/18. Pt stated she does not have access to a smart device. Pt was advise to keep phone visit and check vitals prior to appt.Pt concerns were address.

## 2018-06-16 NOTE — Telephone Encounter (Signed)
Pt called wanting to know what she is to do about her apt tomorrow morning.

## 2018-06-17 ENCOUNTER — Telehealth: Payer: Self-pay | Admitting: Cardiology

## 2018-06-17 ENCOUNTER — Telehealth (INDEPENDENT_AMBULATORY_CARE_PROVIDER_SITE_OTHER): Payer: Medicare Other | Admitting: Internal Medicine

## 2018-06-17 VITALS — BP 168/80 | HR 74 | Wt 176.0 lb

## 2018-06-17 DIAGNOSIS — I2510 Atherosclerotic heart disease of native coronary artery without angina pectoris: Secondary | ICD-10-CM

## 2018-06-17 DIAGNOSIS — I495 Sick sinus syndrome: Secondary | ICD-10-CM

## 2018-06-17 DIAGNOSIS — I10 Essential (primary) hypertension: Secondary | ICD-10-CM | POA: Diagnosis not present

## 2018-06-17 DIAGNOSIS — I480 Paroxysmal atrial fibrillation: Secondary | ICD-10-CM

## 2018-06-17 NOTE — Telephone Encounter (Signed)
-----   Message from Thompson Grayer, MD sent at 06/17/2018  2:56 PM EDT ----- Does not have transmitter.  Previously had trouble with remotes due to poor reception.  She is now willing to try to do them from her daughters house but would need a new transmitter.

## 2018-06-17 NOTE — Progress Notes (Signed)
Electrophysiology TeleHealth Note   Due to national recommendations of social distancing due to [place] 19, an audio telehealth visit is felt to be most appropriate for this patient at this time.  Verbal consent obtain from the patient today.  She does not have a smart phone or internet and cannot perform virtual visit.   Date:  06/17/2018   ID:  [name] [name], DOB [birth year], MRN [id number]  Location: patient's home  Provider location: [address] [place]  Evaluation Performed: Follow-up visit  PCP:  [name] Labrum, MD  Cardiologist:  [name] Lesches, MD  Electrophysiologist:  [name] [name]  Chief Complaint:  afib  History of Present Illness:    [name] [name] is a 73 y.o. female who presents via audio conferencing for a telehealth visit today.  Since last being seen in our clinic, the patient reports doing very well.   She does not feel that she has had afib recently. Today, she denies symptoms of palpitations, chest pain, shortness of breath,  lower extremity edema, dizziness, presyncope, or syncope.  The patient is otherwise without complaint today.  The patient denies symptoms of fevers, chills, cough, or new SOB worrisome for COVID 19.  Past Medical History:  Diagnosis Date  . Atrial fibrillation ([place])   . Atrial flutter ([place])   . Coronary atherosclerosis of native coronary artery    BMS to circumflex 9/08  . Hyperlipidemia   . Multiple thyroid nodules   . 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 Type 2 diabetes mellitus ([place])     Past Surgical History:  Procedure Laterality Date  . BREAST LUMPECTOMY    . PACEMAKER PLACEMENT     Medtronic  . ROTATOR CUFF REPAIR    . VENTRAL HERNIA REPAIR    . VESICOVAGINAL FISTULA CLOSURE W/ TAH      Current Outpatient Medications  Medication Sig Dispense Refill  . diltiazem (CARDIZEM CD) 180 MG 24 hr capsule Take 1 capsule (180 mg total) by mouth 2 (two) times daily. 30 capsule 0  . docusate sodium  (COLACE) 100 MG capsule Take 100 mg by mouth daily.    Marland Kitchen dofetilide (TIKOSYN) 500 MCG capsule TAKE 1 CAPSULE BY MOUTH TWO TIMES DAILY 180 capsule 2  . furosemide (LASIX) 20 MG tablet Take 2 tablets (40 mg total) by mouth daily. 60 tablet 2  . glipiZIDE (GLUCOTROL) 10 MG tablet Take 10 mg by mouth 2 (two) times daily before a meal.    . nitroGLYCERIN (NITROSTAT) 0.4 MG SL tablet Place 1 tablet (0.4 mg total) under the tongue every 5 (five) minutes x 3 doses as needed for chest pain (if no relief after 3 rd dose, proceed to ED for an evaluation). 75 tablet 3  . potassium chloride SA (K-DUR,KLOR-CON) 20 MEQ tablet TAKE 1 TABLET BY MOUTH  EVERY OTHER DAY 45 tablet 3  . pravastatin (PRAVACHOL) 40 MG tablet TAKE 1 TABLET BY MOUTH  EVERY EVENING 90 tablet 3  . sitaGLIPtin (JANUVIA) 50 MG tablet Take 50 mg by mouth daily.    [name] 20 MG TABS tablet TAKE 1 TABLET BY MOUTH  DAILY 90 tablet 3   No current facility-administered medications for this visit.     Allergies:   Codeine; Lisinopril; and Metformin and related   Social History:  The patient  reports that she has never smoked. She has never used smokeless tobacco. She reports that she does not drink alcohol.  Family History:  The patient's  family history includes Heart disease in her father and another family member.   ROS:  Please see the history of present illness.   All other systems are personally reviewed and negative.    Exam:    Vital Signs:  BP (!) 168/80   Pulse 74   Wt 176 lb (79.8 kg)   BMI 31.18 kg/m   Well sounding   Labs/Other Tests and Data Reviewed:    Recent Labs: No results found for requested labs within last 8760 hours.   Wt Readings from Last 3 Encounters:  06/17/18 176 lb (79.8 kg)  02/21/18 179 lb 12.8 oz (81.6 kg)  12/07/17 189 lb (85.7 kg)     Other studies personally reviewed: Additional studies/ records that were reviewed today include: my prior notes,  [name] [name] notes  Review of the above  records today demonstrates: as above    ASSESSMENT & PLAN:    1.  Paroxysmal atrial fibrillation Doing very well  Tolerating xarelto Needs labs and ekg on follow-up with [name] Domenic Polite for [place]  2. CAD No ischemic symptoms  3. Sick sinus syndrome Importance of compliance with remotes was discussed with her today.  I will have device clinic reach out to her to arrange for another transmitter.   Though her cell reception is poor, she is willing to send transmissions from her daughters house if needed  4. COVID 19 screen The patient denies symptoms of COVID 19 at this time.  The importance of social distancing was discussed today.  Follow-up:  12 months with me Needs in office follow-up in July or as soon as able with [name] Domenic Polite for ekg and tikosyn labs Next remote: will arrange  Current medicines are reviewed at length with the patient today.   The patient does not have concerns regarding her medicines.  The following changes were made today:  none  Labs/ tests ordered today include:  No orders of the defined types were placed in this encounter.  Patient Risk:  after full review of this patients clinical status, I feel that they are at moderate risk at this time.  Today, I have spent 15 minutes with the patient with telehealth technology discussing afib .    SignedThompson Grayer, MD  06/17/2018 2:57 PM     [place] [address] [phone] (office) [phone] (fax)

## 2018-06-17 NOTE — Telephone Encounter (Signed)
LMOVM for pt to return call. Need to confirm address to send monitor to.

## 2018-06-20 NOTE — Telephone Encounter (Signed)
Spoke w/ pt she confirmed the address to send the home monitor. Informed her that once she receives the monitor and if she needs help sending the transmission she can give [place] a call and we will help her send the transmission. Staff message to sent to myself to follow up in 2 weeks.   Called Medtronic tech support to order the monitor. New monitor ordered.

## 2018-06-28 ENCOUNTER — Ambulatory Visit (INDEPENDENT_AMBULATORY_CARE_PROVIDER_SITE_OTHER): Payer: Medicare Other | Admitting: *Deleted

## 2018-06-28 ENCOUNTER — Other Ambulatory Visit: Payer: Self-pay

## 2018-06-28 DIAGNOSIS — I480 Paroxysmal atrial fibrillation: Secondary | ICD-10-CM

## 2018-06-28 DIAGNOSIS — I495 Sick sinus syndrome: Secondary | ICD-10-CM | POA: Diagnosis not present

## 2018-06-29 LAB — CUP PACEART REMOTE DEVICE CHECK
Battery Impedance: 1482 Ohm
Battery Remaining Longevity: 44 mo
Battery Voltage: 2.77 V
Brady Statistic AP VP Percent: 0 %
Brady Statistic AP VS Percent: 66 %
Brady Statistic AS VP Percent: 0 %
Brady Statistic AS VS Percent: 33 %
Date Time Interrogation Session: 20200511174301
Implantable Lead Implant Date: 20110510
Implantable Lead Implant Date: 20110510
Implantable Lead Location: 753859
Implantable Lead Location: 753860
Implantable Lead Model: 5076
Implantable Lead Model: 5092
Implantable Pulse Generator Implant Date: 20110510
Lead Channel Impedance Value: 440 Ohm
Lead Channel Impedance Value: 518 Ohm
Lead Channel Pacing Threshold Amplitude: 0.5 V
Lead Channel Pacing Threshold Amplitude: 1 V
Lead Channel Pacing Threshold Pulse Width: 0.4 ms
Lead Channel Pacing Threshold Pulse Width: 0.4 ms
Lead Channel Setting Pacing Amplitude: 2 V
Lead Channel Setting Pacing Amplitude: 2.5 V
Lead Channel Setting Pacing Pulse Width: 0.4 ms
Lead Channel Setting Sensing Sensitivity: 2.8 mV

## 2018-07-05 DIAGNOSIS — Z23 Encounter for immunization: Secondary | ICD-10-CM | POA: Diagnosis not present

## 2018-07-05 DIAGNOSIS — E041 Nontoxic single thyroid nodule: Secondary | ICD-10-CM | POA: Diagnosis not present

## 2018-07-07 DIAGNOSIS — E041 Nontoxic single thyroid nodule: Secondary | ICD-10-CM | POA: Diagnosis not present

## 2018-07-07 DIAGNOSIS — E042 Nontoxic multinodular goiter: Secondary | ICD-10-CM | POA: Diagnosis not present

## 2018-07-14 NOTE — Progress Notes (Signed)
Remote pacemaker transmission.   

## 2018-07-18 ENCOUNTER — Telehealth: Payer: Self-pay | Admitting: Cardiology

## 2018-07-18 ENCOUNTER — Other Ambulatory Visit: Payer: Self-pay | Admitting: Otolaryngology

## 2018-07-18 DIAGNOSIS — H6122 Impacted cerumen, left ear: Secondary | ICD-10-CM | POA: Diagnosis not present

## 2018-07-18 DIAGNOSIS — E041 Nontoxic single thyroid nodule: Secondary | ICD-10-CM | POA: Diagnosis not present

## 2018-07-18 DIAGNOSIS — Z9089 Acquired absence of other organs: Secondary | ICD-10-CM | POA: Diagnosis not present

## 2018-07-18 DIAGNOSIS — Z9889 Other specified postprocedural states: Secondary | ICD-10-CM | POA: Diagnosis not present

## 2018-07-18 NOTE — Telephone Encounter (Signed)
Patient informed and information faxed to Dr. Lind Guest.

## 2018-07-18 NOTE — Telephone Encounter (Signed)
Received telephone call from patient. States that she needs a biopsy of neck to check her thyroid. States that they need to know about patient coming off Xarelto.  Dr. Lind Guest with [facility].  Telephone # [phone] & fax [phone].

## 2018-07-18 NOTE — Telephone Encounter (Signed)
Would hold Xarelto for 48 hours prior to thyroid biopsy.

## 2018-07-19 ENCOUNTER — Other Ambulatory Visit: Payer: Self-pay

## 2018-07-19 ENCOUNTER — Other Ambulatory Visit: Payer: Self-pay | Admitting: Cardiology

## 2018-07-19 DIAGNOSIS — E041 Nontoxic single thyroid nodule: Secondary | ICD-10-CM | POA: Diagnosis not present

## 2018-07-19 MED ORDER — DILTIAZEM HCL ER COATED BEADS 180 MG PO CP24: 180.0000 mg | ORAL_CAPSULE | Freq: Two times a day (BID) | ORAL | 3 refills | Status: DC

## 2018-07-19 MED ORDER — DOFETILIDE 500 MCG PO CAPS: 500.0000 ug | ORAL_CAPSULE | Freq: Two times a day (BID) | ORAL | 3 refills | Status: DC

## 2018-07-19 NOTE — Telephone Encounter (Signed)
°  1. Which medications need to be refilled? (please list name of each medication and dose if known)  dofetilide (TIKOSYN) 500 MCG capsule    2. Which pharmacy/location (including street and city if local pharmacy) is medication to be sent to? [facility]   3. Do they need a 30 day or 90 day supply?

## 2018-07-19 NOTE — Telephone Encounter (Signed)
Done

## 2018-08-01 DIAGNOSIS — I8312 Varicose veins of left lower extremity with inflammation: Secondary | ICD-10-CM | POA: Diagnosis not present

## 2018-08-02 DIAGNOSIS — T148XXA Other injury of unspecified body region, initial encounter: Secondary | ICD-10-CM | POA: Diagnosis not present

## 2018-08-03 DIAGNOSIS — I8312 Varicose veins of left lower extremity with inflammation: Secondary | ICD-10-CM | POA: Diagnosis not present

## 2018-08-15 DIAGNOSIS — S1096XA Insect bite of unspecified part of neck, initial encounter: Secondary | ICD-10-CM | POA: Diagnosis not present

## 2018-08-19 DIAGNOSIS — L509 Urticaria, unspecified: Secondary | ICD-10-CM | POA: Diagnosis not present

## 2018-08-24 DIAGNOSIS — I8312 Varicose veins of left lower extremity with inflammation: Secondary | ICD-10-CM | POA: Diagnosis not present

## 2018-08-24 DIAGNOSIS — I83812 Varicose veins of left lower extremities with pain: Secondary | ICD-10-CM | POA: Diagnosis not present

## 2018-08-26 ENCOUNTER — Telehealth: Payer: Self-pay | Admitting: *Deleted

## 2018-08-26 DIAGNOSIS — I480 Paroxysmal atrial fibrillation: Secondary | ICD-10-CM | POA: Diagnosis not present

## 2018-08-26 NOTE — Telephone Encounter (Signed)
-----   Message from Satira Sark, MD sent at 08/26/2018 11:27 AM EDT ----- Results reviewed.  Continue with current medications and follow-up plan.

## 2018-08-26 NOTE — Telephone Encounter (Signed)
Patient informed. Copy sent to PCP °

## 2018-08-30 DIAGNOSIS — D72829 Elevated white blood cell count, unspecified: Secondary | ICD-10-CM | POA: Diagnosis not present

## 2018-09-02 ENCOUNTER — Telehealth: Payer: Self-pay | Admitting: Cardiology

## 2018-09-02 NOTE — Telephone Encounter (Signed)

## 2018-09-05 ENCOUNTER — Telehealth: Payer: Self-pay | Admitting: *Deleted

## 2018-09-05 ENCOUNTER — Other Ambulatory Visit: Payer: Self-pay

## 2018-09-05 ENCOUNTER — Ambulatory Visit (INDEPENDENT_AMBULATORY_CARE_PROVIDER_SITE_OTHER): Payer: Medicare Other | Admitting: Cardiology

## 2018-09-05 ENCOUNTER — Encounter: Payer: Self-pay | Admitting: Cardiology

## 2018-09-05 VITALS — BP 132/88 | HR 116 | Temp 98.7°F | Ht 63.0 in | Wt 178.0 lb

## 2018-09-05 DIAGNOSIS — I480 Paroxysmal atrial fibrillation: Secondary | ICD-10-CM | POA: Diagnosis not present

## 2018-09-05 DIAGNOSIS — I25119 Atherosclerotic heart disease of native coronary artery with unspecified angina pectoris: Secondary | ICD-10-CM

## 2018-09-05 DIAGNOSIS — E782 Mixed hyperlipidemia: Secondary | ICD-10-CM | POA: Diagnosis not present

## 2018-09-05 DIAGNOSIS — I495 Sick sinus syndrome: Secondary | ICD-10-CM

## 2018-09-05 MED ORDER — DILTIAZEM HCL ER COATED BEADS 240 MG PO CP24: 240.0000 mg | ORAL_CAPSULE | ORAL | Status: DC

## 2018-09-05 MED ORDER — DILTIAZEM HCL ER COATED BEADS 240 MG PO CP24: 240.0000 mg | ORAL_CAPSULE | Freq: Every day | ORAL | 6 refills | Status: DC

## 2018-09-05 MED ORDER — DILTIAZEM HCL ER COATED BEADS 180 MG PO CP24: 180.0000 mg | ORAL_CAPSULE | Freq: Every evening | ORAL | Status: DC

## 2018-09-05 NOTE — Telephone Encounter (Signed)
Patient had OV today with Dr. Domenic Polite - he requested patient send in remote transmission this evening or tomorrow.  Checking to see how often she is in AFib.

## 2018-09-05 NOTE — Patient Instructions (Addendum)
Medication Instructions:   Increase Diltiazem CD to 240mg  every morning & 180mg  every evening.  Continue all other current medications.  Labwork: none  Testing/Procedures: none  Follow-Up: 3 months   Any Other Special Instructions Will Be Listed Below (If Applicable). Please send remote transmission to device clinic.    If you need a refill on your cardiac medications before your next appointment, please call your pharmacy.

## 2018-09-05 NOTE — Progress Notes (Signed)
Cardiology Office Note  Date: 09/05/2018   ID: [name] [name], DOB Oct 07, 1945, MRN [id number]  PCP:  Curlene Labrum, MD  Cardiologist:  Rozann Lesches, MD Electrophysiologist:  None   Chief Complaint  Patient presents with  . Atrial Fibrillation    History of Present Illness: [name] [name] is a 73 y.o. female last seen in January.  She presents for a routine visit.  She does not report any palpitations or chest pain, but has noticed that she has been more fatigued, somewhat more short of breath at times - she attributed this to the heat and humidity.  She sees [name] in the device clinic, Medtronic pacemaker in place.  She had 4.7% AF burden by last device interrogation in May.  I reviewed her recent lab work which is outlined below.  She does not report any bleeding problems or changes in stool.  I personally reviewed her ECG today which shows sinus rhythm with leftward axis and nonspecific ST-T changes.  QTc 369 ms.  She remains on a stable cardiac regimen which is outlined below.  She reports compliance.  Past Medical History:  Diagnosis Date  . Atrial fibrillation ([place])   . Atrial flutter ([place])   . Coronary atherosclerosis of native coronary artery    BMS to circumflex 9/08  . Hyperlipidemia   . Multiple thyroid nodules   . 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 Type 2 diabetes mellitus ([place])     Past Surgical History:  Procedure Laterality Date  . BREAST LUMPECTOMY    . PACEMAKER PLACEMENT     Medtronic  . ROTATOR CUFF REPAIR    . VENTRAL HERNIA REPAIR    . VESICOVAGINAL FISTULA CLOSURE W/ TAH      Current Outpatient Medications  Medication Sig Dispense Refill  . diltiazem (CARDIZEM CD) 180 MG 24 hr capsule Take 1 capsule (180 mg total) by mouth every evening.    . docusate sodium (COLACE) 100 MG capsule Take 100 mg by mouth daily.    Marland Kitchen dofetilide (TIKOSYN) 500 MCG capsule Take 1 capsule (500 mcg total) by mouth 2 (two) times daily. 180  capsule 3  . glipiZIDE (GLUCOTROL) 10 MG tablet Take 10 mg by mouth 2 (two) times daily before a meal.    . nitroGLYCERIN (NITROSTAT) 0.4 MG SL tablet Place 1 tablet (0.4 mg total) under the tongue every 5 (five) minutes x 3 doses as needed for chest pain (if no relief after 3 rd dose, proceed to ED for an evaluation). 75 tablet 3  . potassium chloride SA (K-DUR,KLOR-CON) 20 MEQ tablet TAKE 1 TABLET BY MOUTH  EVERY OTHER DAY 45 tablet 3  . pravastatin (PRAVACHOL) 40 MG tablet TAKE 1 TABLET BY MOUTH  EVERY EVENING 90 tablet 3  . sitaGLIPtin (JANUVIA) 50 MG tablet Take 50 mg by mouth daily.    [name] 20 MG TABS tablet TAKE 1 TABLET BY MOUTH  DAILY 90 tablet 3  . diltiazem (CARDIZEM CD) 240 MG 24 hr capsule Take 1 capsule (240 mg total) by mouth every morning.     No current facility-administered medications for this visit.    Allergies:  Codeine, Lisinopril, and Metformin and related   Social History: The patient  reports that she has never smoked. She has never used smokeless tobacco. She reports that she does not drink alcohol.   ROS:  Please see the history of present illness. Otherwise, complete review of systems is positive  for none.  All other systems are reviewed and negative.   Physical Exam: VS:  BP 132/88   Pulse (!) 116   Temp 98.7 F (37.1 C)   Ht 5\' 3"  (1.6 m)   Wt 178 lb (80.7 kg)   SpO2 98%   BMI 31.53 kg/m , BMI Body mass index is 31.53 kg/m.  Wt Readings from Last 3 Encounters:  09/05/18 178 lb (80.7 kg)  06/17/18 176 lb (79.8 kg)  02/21/18 179 lb 12.8 oz (81.6 kg)    General: Patient appears comfortable at rest. HEENT: Conjunctiva and lids normal, wearing a mask. Neck: Supple, no elevated JVP or carotid bruits, no thyromegaly. Lungs: Clear to auscultation, nonlabored breathing at rest. Cardiac: Irregularly irregular, no S3 or significant systolic murmur, no pericardial rub. Abdomen: Soft, nontender, bowel sounds present. Extremities: Mild ankle edema, distal  pulses 2+. Skin: Warm and dry. Musculoskeletal: No kyphosis. Neuropsychiatric: Alert and oriented x3, affect grossly appropriate.  ECG:  An ECG dated 12/07/2017 was personally reviewed today and demonstrated:  Atrial paced rhythm with leftward axis and nonspecific T wave changes.  Recent Labwork:  July 2020: BUN 21, creatinine 0.56, potassium 4.2, magnesium 2.2, hemoglobin 16.7, platelets 208  Other Studies Reviewed Today:  Echocardiogram 09/17/2017: Study Conclusions  - Left ventricle: The cavity size was normal. Wall thickness was increased in a pattern of moderate LVH. Systolic function was normal. The estimated ejection fraction was in the range of 60% to 65%. Wall motion was normal; there were no regional wall motion abnormalities. The study is not technically sufficient to allow evaluation of LV diastolic function. - Aortic valve: Valve area (VTI): 3.43 cm^2. Valve area (Vmax): 3.06 cm^2.  Assessment and Plan:  1.  Paroxysmal to persistent atrial fibrillation.  She had a rapid irregular heart rate when she presented, also present on examination, however subsequent ECG showed sinus rhythm.  Suspect that this contributes to her relative fatigue and shortness of breath.  Recent lab work reviewed, she is not anemic and has stable electrolytes and renal function.  Plan to have her send in an interrogation of her Medtronic pacemaker for EP review to see how much atrial fibrillation burden she has been experiencing in the last few months.. Increase Cardizem CD to 240 mg in the morning and 180 mg in the afternoon.  2.  CAD status post BMS to the circumflex.  She does not report any active angina symptoms.  Continue statin therapy.  3.  Mixed hyperlipidemia on Pravachol.  She continues to follow with PCP.  4.  Tachycardia-bradycardia syndrome status post Medtronic pacemaker. She follows with [name].  Plan as outlined above.  Question whether she might ultimately be a  candidate for AV node ablation if atrial fibrillation burden increases on Tikosyn and we have persisting difficulties with rate control.  Medication Adjustments/Labs and Tests Ordered: Current medicines are reviewed at length with the patient today.  Concerns regarding medicines are outlined above.   Tests Ordered: Orders Placed This Encounter  Procedures  . EKG 12-Lead    Medication Changes: Meds ordered this encounter  Medications  . diltiazem (CARDIZEM CD) 180 MG 24 hr capsule    Sig: Take 1 capsule (180 mg total) by mouth every evening.    Dose changed to evening.  Will take in addition to 240mg  every morning.  09/05/2018.  Marland Kitchen DISCONTD: diltiazem (CARDIZEM CD) 240 MG 24 hr capsule    Sig: Take 1 capsule (240 mg total) by mouth daily.  Dispense:  30 capsule    Refill:  6    Dose increased 09/05/2018.  Will take in addition to 180mg  every evening.  . diltiazem (CARDIZEM CD) 240 MG 24 hr capsule    Sig: Take 1 capsule (240 mg total) by mouth every morning.    Dose increased 09/05/2018.  Will take in addition to 180mg  every evening.    Disposition:  Follow up 3 months in the [place] office.  Signed, Satira Sark, MD, [facility] 09/05/2018 10:55 AM    [facility] [place], [place], [place] [address] Phone: [phone]; Fax: [phone]

## 2018-09-06 NOTE — Telephone Encounter (Signed)
I spoke with the pt and she states she is going to her daughter house later today to send a manual transmission with her home monitor. I told her I will let the nurse know when I see the transmission.

## 2018-09-07 DIAGNOSIS — L509 Urticaria, unspecified: Secondary | ICD-10-CM | POA: Diagnosis not present

## 2018-09-09 MED ORDER — DILTIAZEM HCL ER COATED BEADS 240 MG PO CP24: 240.0000 mg | ORAL_CAPSULE | ORAL | 3 refills | Status: DC

## 2018-09-09 NOTE — Telephone Encounter (Signed)
Transmission from 09/06/18 reviewed. 4.7% AF burden, longest episode 1.5hrs duration on 03/22/2018, burden stable since 06/2018 transmission. V rates during AF are elevated at times per histogram, noted that Cardizem CD was increased at recent visit with Dr. Domenic Polite. Routed to Dr. Domenic Polite for review.

## 2018-09-09 NOTE — Telephone Encounter (Signed)
Thank you for the update.  Continue with current plan.

## 2018-09-09 NOTE — Telephone Encounter (Signed)
Patient notified and verbalized understanding.  Cardizem CD 240mg  sent for 90 day supply to [facility] at patient request.

## 2018-09-12 DIAGNOSIS — L308 Other specified dermatitis: Secondary | ICD-10-CM | POA: Diagnosis not present

## 2018-09-16 DIAGNOSIS — I8312 Varicose veins of left lower extremity with inflammation: Secondary | ICD-10-CM | POA: Diagnosis not present

## 2018-09-16 DIAGNOSIS — M7981 Nontraumatic hematoma of soft tissue: Secondary | ICD-10-CM | POA: Diagnosis not present

## 2018-09-16 DIAGNOSIS — I83812 Varicose veins of left lower extremities with pain: Secondary | ICD-10-CM | POA: Diagnosis not present

## 2018-09-19 ENCOUNTER — Other Ambulatory Visit: Payer: Self-pay | Admitting: Cardiology

## 2018-09-26 ENCOUNTER — Other Ambulatory Visit: Payer: Self-pay | Admitting: *Deleted

## 2018-09-26 MED ORDER — DILTIAZEM HCL ER COATED BEADS 240 MG PO CP24: 240.0000 mg | ORAL_CAPSULE | ORAL | 3 refills | Status: DC

## 2018-09-26 MED ORDER — DILTIAZEM HCL ER COATED BEADS 180 MG PO CP24: 180.0000 mg | ORAL_CAPSULE | Freq: Every evening | ORAL | 3 refills | Status: DC

## 2018-09-27 ENCOUNTER — Ambulatory Visit (INDEPENDENT_AMBULATORY_CARE_PROVIDER_SITE_OTHER): Payer: Medicare Other | Admitting: *Deleted

## 2018-09-27 DIAGNOSIS — I495 Sick sinus syndrome: Secondary | ICD-10-CM

## 2018-09-28 LAB — CUP PACEART REMOTE DEVICE CHECK
Battery Impedance: 1482 Ohm
Battery Remaining Longevity: 43 mo
Battery Voltage: 2.77 V
Brady Statistic AP VP Percent: 0 %
Brady Statistic AP VS Percent: 65 %
Brady Statistic AS VP Percent: 0 %
Brady Statistic AS VS Percent: 35 %
Date Time Interrogation Session: 20200811201425
Implantable Lead Implant Date: 20110510
Implantable Lead Implant Date: 20110510
Implantable Lead Location: 753859
Implantable Lead Location: 753860
Implantable Lead Model: 5076
Implantable Lead Model: 5092
Implantable Pulse Generator Implant Date: 20110510
Lead Channel Impedance Value: 429 Ohm
Lead Channel Impedance Value: 507 Ohm
Lead Channel Pacing Threshold Amplitude: 0.5 V
Lead Channel Pacing Threshold Amplitude: 1 V
Lead Channel Pacing Threshold Pulse Width: 0.4 ms
Lead Channel Pacing Threshold Pulse Width: 0.4 ms
Lead Channel Setting Pacing Amplitude: 2 V
Lead Channel Setting Pacing Amplitude: 2.5 V
Lead Channel Setting Pacing Pulse Width: 0.4 ms
Lead Channel Setting Sensing Sensitivity: 2.8 mV

## 2018-09-30 ENCOUNTER — Other Ambulatory Visit: Payer: Self-pay | Admitting: Student

## 2018-09-30 DIAGNOSIS — I8312 Varicose veins of left lower extremity with inflammation: Secondary | ICD-10-CM | POA: Diagnosis not present

## 2018-09-30 DIAGNOSIS — M7981 Nontraumatic hematoma of soft tissue: Secondary | ICD-10-CM | POA: Diagnosis not present

## 2018-10-05 ENCOUNTER — Other Ambulatory Visit: Payer: Self-pay

## 2018-10-05 ENCOUNTER — Encounter: Payer: Self-pay | Admitting: Cardiology

## 2018-10-05 ENCOUNTER — Encounter: Payer: Self-pay | Admitting: Allergy

## 2018-10-05 ENCOUNTER — Ambulatory Visit: Payer: Medicare Other | Admitting: Allergy

## 2018-10-05 VITALS — BP 138/70 | HR 74 | Temp 98.0°F | Resp 18 | Ht 62.5 in | Wt 179.0 lb

## 2018-10-05 DIAGNOSIS — L508 Other urticaria: Secondary | ICD-10-CM | POA: Diagnosis not present

## 2018-10-05 MED ORDER — FAMOTIDINE 20 MG PO TABS: 20.0000 mg | ORAL_TABLET | Freq: Two times a day (BID) | ORAL | 2 refills | Status: DC

## 2018-10-05 MED ORDER — CETIRIZINE HCL 10 MG PO TABS: 10.0000 mg | ORAL_TABLET | Freq: Two times a day (BID) | ORAL | 2 refills | Status: DC

## 2018-10-05 NOTE — Progress Notes (Signed)
Remote pacemaker transmission.   

## 2018-10-05 NOTE — Patient Instructions (Addendum)
Hives, chronic  -  at this time etiology of hives and swelling is unknown however it does appear that you are being triggered by something in the home where you are working.  Hives can be caused by a variety of different triggers including illness/infection, foods, medications, stings, exercise, pressure, vibrations, extremes of temperature to name a few however majority of the time there is no identifiable trigger.  Your symptoms have been ongoing for >6 weeks making this chronic thus will obtain labwork to evaluate: CBC w diff, CMP, tryptase, hive panel, environmental panel  - for management of hives recommend use of high-dose antihistamine: Zyrtec 10mg  1 tablet twice a day, Pepcid 20mg  1 tablet twice a day  - if hives persist despite regimen above then will add in Singulair 10mg  daily  - we discussed Xolair monthly injections if high-dose antihistamine regimen if not effective enough in controlling hives and to allow you to continue to work in current environment.     Follow-up 2-3 months or sooner if needed

## 2018-10-05 NOTE — Progress Notes (Signed)
New Patient Note  RE: [name] [name] MRN: [id number] DOB: 1945/08/28 Date of Office Visit: 10/05/2018  Referring provider: Curlene Labrum, MD Primary care provider: Curlene Labrum, MD  Chief Complaint: rash  History of present illness: [name] [name] is a 73 y.o. female presenting today for consultation for rash.   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  She started this job about 2 months ago and she states about a week into the job she started to notice itchy and then she noted the rash.  She goes to this job 3-4 nights of the week.  She states on the days she does not go to this job she does not develop hives.   The hives are primarily on her arms, neck, chest and stomach.  Hives have been coming and going for past 4-6 weeks.  She states while she is in this home she usually is sitting or sleeping on their cough.  She states she takes her own sheets to put on the couch that she sleeps on.  She states the elderly women sits on the same couch at times and she does not have a rash or any issues with itching.  The husband also does not have similar symptoms.  She denies any fevers, joint aches/pains, no associated swelling.  The hives do not leave any bruising behind once resolved.  She denies any preceding illnesses, no new medications, no new foods, no stings/bites, no change in soaps/lotions/detergents.   She have been using atarax which does help somewhat with itching.  She states she has also been on prednisone course which was helpful however once course completed the hives did return.  She also has tried cortizone which hasn't been helpful.     She denies history of asthma, eczema, food allergy or season/perennial allergies.  Review of systems: Review of Systems  Constitutional: Negative for chills, fever and malaise/fatigue.  HENT: Negative for congestion, ear discharge, nosebleeds and sore  throat.   Eyes: Negative for pain, discharge and redness.  Respiratory: Negative for cough, shortness of breath and wheezing.   Cardiovascular: Negative for chest pain.  Gastrointestinal: Negative for abdominal pain, constipation, diarrhea, heartburn, nausea and vomiting.  Musculoskeletal: Negative for joint pain.  Skin: Positive for itching and rash.  Neurological: Negative for headaches.    All other systems negative unless noted above in HPI  Past medical history: Past Medical History:  Diagnosis Date  . Atrial fibrillation ([place])   . Atrial flutter ([place])   . Coronary atherosclerosis of native coronary artery    BMS to circumflex 9/08  . Hyperlipidemia   . Multiple thyroid nodules   . 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 Type 2 diabetes mellitus ([place])   . Urticaria     Past surgical history: Past Surgical History:  Procedure Laterality Date  . BREAST LUMPECTOMY    . PACEMAKER PLACEMENT     Medtronic  . ROTATOR CUFF REPAIR    . VENTRAL HERNIA REPAIR    . VESICOVAGINAL FISTULA CLOSURE W/ TAH      Family history:  Family History  Problem Relation Age of Onset  . Heart disease Father   . Heart disease Other        one sibling    Social history: Lives in a single wide mobile home with electric heating and window cooling.  Dog in the home.  No concern for water damage, mildew or roaches in the home.  She is retired.  Denies smoking history.    Medication List: Allergies as of 10/05/2018      Reactions   Codeine    REACTION: vomiting   Lisinopril    Cough   Metformin And Related    rash      Medication List       Accurate as of October 05, 2018  4:23 PM. If you have any questions, ask your nurse or doctor.        augmented betamethasone dipropionate 0.05 % cream Commonly known as: DIPROLENE-AF APPLY CREAM TOPICALLY TO AFFECTED AREA TWICE DAILY AS NEEDED FOR RASH (NOT TO FACE GROIN OR UNDERARMS)   cetirizine 10 MG tablet Commonly known as:  ZYRTEC Take 1 tablet (10 mg total) by mouth 2 (two) times daily. Started by: Shaylar Charmian Muff, MD   Cortizone-10/Aloe 1 % Crea Generic drug: Hydrocortisone-Aloe Vera Apply topically.   diltiazem 180 MG 24 hr capsule Commonly known as: CARDIZEM CD Take 1 capsule (180 mg total) by mouth every evening.   diltiazem 240 MG 24 hr capsule Commonly known as: CARDIZEM CD Take 1 capsule (240 mg total) by mouth every morning.   diphenhydrAMINE-zinc acetate cream Commonly known as: BENADRYL Apply 1 application topically 3 (three) times daily as needed for itching.   docusate sodium 100 MG capsule Commonly known as: COLACE Take 100 mg by mouth daily.   dofetilide 500 MCG capsule Commonly known as: TIKOSYN Take 1 capsule (500 mcg total) by mouth 2 (two) times daily.   famotidine 20 MG tablet Commonly known as: Pepcid Take 1 tablet (20 mg total) by mouth 2 (two) times daily. Started by: Shaylar Charmian Muff, MD   glipiZIDE 10 MG tablet Commonly known as: GLUCOTROL Take 10 mg by mouth 2 (two) times daily before a meal.   hydrOXYzine 25 MG capsule Commonly known as: VISTARIL TAKE 1 CAPSULE BY MOUTH TWICE DAILY AS NEEDED FOR ITCHING   nitroGLYCERIN 0.4 MG SL tablet Commonly known as: NITROSTAT Place 1 tablet (0.4 mg total) under the tongue every 5 (five) minutes x 3 doses as needed for chest pain (if no relief after 3 rd dose, proceed to ED for an evaluation).   potassium chloride SA 20 MEQ tablet Commonly known as: K-DUR TAKE 1 TABLET BY MOUTH  EVERY OTHER DAY   pravastatin 40 MG tablet Commonly known as: PRAVACHOL TAKE 1 TABLET BY MOUTH  EVERY EVENING   sitaGLIPtin 50 MG tablet Commonly known as: JANUVIA Take 50 mg by mouth daily.   triamcinolone cream 0.1 % Commonly known as: KENALOG APPLY TOPICALLY 3 TIMES DAILY   Xarelto 20 MG Tabs tablet Generic drug: rivaroxaban TAKE 1 TABLET BY MOUTH  DAILY       Known medication allergies: Allergies  Allergen  Reactions  . Codeine     REACTION: vomiting  . Lisinopril     Cough   . Metformin And Related     rash     Physical examination: Blood pressure 138/70, pulse 74, temperature 98 F (36.7 C), temperature source Temporal, resp. rate 18, height 5' 2.5" (1.588 m), weight 179 lb (81.2 kg), SpO2 95 %.  General: Alert, interactive, in no acute distress. HEENT: PERRLA, TMs pearly gray, turbinates non-edematous without discharge, post-pharynx non erythematous. Neck: Supple without lymphadenopathy. Lungs: Clear to auscultation without wheezing, rhonchi or rales. {no increased work of breathing. CV: Normal S1, S2 without  murmurs. Abdomen: Nondistended, nontender. Skin: Scattered erythematous urticarial type lesions primarily located b/l forearms, left neck , nonvesicular. Extremities:  No clubbing, cyanosis or edema. Neuro:   Grossly intact.  Diagnositics/Labs:  Allergy testing: deferred due to ongoing urticaria  Assessment and plan:   Urticaria, chronic  -  at this time etiology of hives and swelling is unknown however it does appear that you are being triggered by something in the home where you are working.  Hives can be caused by a variety of different triggers including illness/infection, foods, medications, stings, exercise, pressure, vibrations, extremes of temperature to name a few however majority of the time there is no identifiable trigger.  Your symptoms have been ongoing for >6 weeks making this chronic thus will obtain labwork to evaluate: CBC w diff, CMP, tryptase, hive panel, environmental panel  - for management of hives recommend use of high-dose antihistamine: Zyrtec 10mg  1 tablet twice a day, Pepcid 20mg  1 tablet twice a day  - if hives persist despite regimen above then will add in Singulair 10mg  daily  - we discussed Xolair monthly injections if high-dose antihistamine regimen if not effective enough in controlling hives and to allow you to continue to work in current  environment.     Follow-up 2-3 months or sooner if needed I appreciate the opportunity to take part in [name] [name]'s care. Please do not hesitate to contact me with questions.  Sincerely,   Prudy Feeler, MD Allergy/Immunology [facility]

## 2018-10-06 ENCOUNTER — Other Ambulatory Visit: Payer: Self-pay | Admitting: *Deleted

## 2018-10-06 ENCOUNTER — Telehealth: Payer: Self-pay | Admitting: Allergy

## 2018-10-06 NOTE — Telephone Encounter (Signed)
Pt called and said that she came in and was seen by dr Nelva Bush and was giving cetirizine and famotidine and she took them last

## 2018-10-06 NOTE — Telephone Encounter (Signed)
What does she mean by "shakes".   Was she having chills? If she is concerned that one of these medications caused it then will need to have her take separately to determine which medication is the culprit.

## 2018-10-06 NOTE — Telephone Encounter (Signed)
Dr Padgett please advise 

## 2018-10-06 NOTE — Telephone Encounter (Signed)
error 

## 2018-10-06 NOTE — Telephone Encounter (Signed)
Pt called and came in yesterday and seen dr Nelva Bush and was giving cetirizine and famotidine . She said that she took them last night and had the shakes and doesn't know which one it was. She has not took them this morning.[phone]

## 2018-10-06 NOTE — Telephone Encounter (Signed)
Patient was advise to take one Cetrizine in the AM on Friday, and to take one Famotidine on Saturday Morning to see how she feels and to call back on Monday to see which ever meds made her feel bad.

## 2018-10-10 NOTE — Telephone Encounter (Signed)
Patient called and stated that she took the Zyrtec Friday morning and then Pepcid Saturday morning. Patient stated that she didn't have any issue with neither of the medications only when she took them together. Please advise.

## 2018-10-11 DIAGNOSIS — E119 Type 2 diabetes mellitus without complications: Secondary | ICD-10-CM | POA: Diagnosis not present

## 2018-10-11 NOTE — Telephone Encounter (Signed)
Called and informed patient of medication change. Patient wrote directions down and gave verbal understanding. Patient stated she will call us if she has any problems.

## 2018-10-11 NOTE — Telephone Encounter (Signed)
Ok great.   Then let's have her start off taking Zyrtec 10mg  2 tablet a day in AM and Pepcid 20mg   2 tablet a day in PM.

## 2018-10-12 LAB — ALLERGENS W/TOTAL IGE AREA 2
Alternaria Alternata IgE: <0.1 kU/L
Aspergillus Fumigatus IgE: <0.1 kU/L
Bermuda Grass IgE: <0.1 kU/L
Cat Dander IgE: <0.1 kU/L
Cedar, Mountain IgE: <0.1 kU/L
Cladosporium Herbarum IgE: <0.1 kU/L
Cockroach, German IgE: 0.12 kU/L — AB
Common Silver Birch IgE: <0.1 kU/L
Cottonwood IgE: <0.1 kU/L
D Farinae IgE: <0.1 kU/L
D Pteronyssinus IgE: <0.1 kU/L
Dog Dander IgE: <0.1 kU/L
Elm, American IgE: <0.1 kU/L
IgE (Immunoglobulin E), Serum: 27 IU/mL (ref 6–495)
Johnson Grass IgE: <0.1 kU/L
Maple/Box Elder IgE: <0.1 kU/L
Mouse Urine IgE: <0.1 kU/L
Oak, White IgE: <0.1 kU/L
Pecan, Hickory IgE: <0.1 kU/L
Penicillium Chrysogen IgE: <0.1 kU/L
Pigweed, Rough IgE: <0.1 kU/L
Ragweed, Short IgE: <0.1 kU/L
Sheep Sorrel IgE Qn: <0.1 kU/L
Timothy Grass IgE: <0.1 kU/L
White Mulberry IgE: <0.1 kU/L

## 2018-10-12 LAB — CBC WITH DIFFERENTIAL/PLATELET
Basophils Absolute: 0.1 10*3/uL (ref 0.0–0.2)
Basos: 1 %
EOS (ABSOLUTE): 0.3 10*3/uL (ref 0.0–0.4)
Eos: 4 %
Hematocrit: 47.2 % — ABNORMAL HIGH (ref 34.0–46.6)
Hemoglobin: 15.5 g/dL (ref 11.1–15.9)
Immature Grans (Abs): 0 10*3/uL (ref 0.0–0.1)
Immature Granulocytes: 0 %
Lymphocytes Absolute: 1.6 10*3/uL (ref 0.7–3.1)
Lymphs: 19 %
MCH: 30.8 pg (ref 26.6–33.0)
MCHC: 32.8 g/dL (ref 31.5–35.7)
MCV: 94 fL (ref 79–97)
Monocytes Absolute: 0.8 10*3/uL (ref 0.1–0.9)
Monocytes: 9 %
Neutrophils Absolute: 5.5 10*3/uL (ref 1.4–7.0)
Neutrophils: 67 %
Platelets: 202 10*3/uL (ref 150–450)
RBC: 5.04 x10E6/uL (ref 3.77–5.28)
RDW: 13.2 % (ref 11.7–15.4)
WBC: 8.2 10*3/uL (ref 3.4–10.8)

## 2018-10-12 LAB — COMPREHENSIVE METABOLIC PANEL
ALT: 15 IU/L (ref 0–32)
AST: 17 IU/L (ref 0–40)
Albumin/Globulin Ratio: 1.6 (ref 1.2–2.2)
Albumin: 4 g/dL (ref 3.7–4.7)
Alkaline Phosphatase: 91 IU/L (ref 39–117)
BUN/Creatinine Ratio: 17 (ref 12–28)
BUN: 15 mg/dL (ref 8–27)
Bilirubin Total: 0.6 mg/dL (ref 0.0–1.2)
CO2: 21 mmol/L (ref 20–29)
Calcium: 9.4 mg/dL (ref 8.7–10.3)
Chloride: 104 mmol/L (ref 96–106)
Creatinine, Ser: 0.86 mg/dL (ref 0.57–1.00)
GFR calc Af Amer: 76 mL/min/{1.73_m2} (ref >59)
GFR calc non Af Amer: 66 mL/min/{1.73_m2} (ref >59)
Globulin, Total: 2.5 g/dL (ref 1.5–4.5)
Glucose: 274 mg/dL — ABNORMAL HIGH (ref 65–99)
Potassium: 4.6 mmol/L (ref 3.5–5.2)
Sodium: 144 mmol/L (ref 134–144)
Total Protein: 6.5 g/dL (ref 6.0–8.5)

## 2018-10-12 LAB — CHRONIC URTICARIA: cu index: 2.4 (ref <10)

## 2018-10-12 LAB — TRYPTASE: Tryptase: 7.1 ug/L (ref 2.2–13.2)

## 2018-10-12 LAB — THYROID ANTIBODIES
Thyroglobulin Antibody: <1 IU/mL (ref 0.0–0.9)
Thyroperoxidase Ab SerPl-aCnc: <9 IU/mL (ref 0–34)

## 2018-10-18 DIAGNOSIS — Z23 Encounter for immunization: Secondary | ICD-10-CM | POA: Diagnosis not present

## 2018-10-18 DIAGNOSIS — I10 Essential (primary) hypertension: Secondary | ICD-10-CM | POA: Diagnosis not present

## 2018-10-18 DIAGNOSIS — R609 Edema, unspecified: Secondary | ICD-10-CM | POA: Diagnosis not present

## 2018-10-18 DIAGNOSIS — Z1389 Encounter for screening for other disorder: Secondary | ICD-10-CM | POA: Diagnosis not present

## 2018-10-18 DIAGNOSIS — K649 Unspecified hemorrhoids: Secondary | ICD-10-CM | POA: Diagnosis not present

## 2018-10-18 DIAGNOSIS — E1165 Type 2 diabetes mellitus with hyperglycemia: Secondary | ICD-10-CM | POA: Diagnosis not present

## 2018-10-18 DIAGNOSIS — I480 Paroxysmal atrial fibrillation: Secondary | ICD-10-CM | POA: Diagnosis not present

## 2018-10-19 ENCOUNTER — Other Ambulatory Visit: Payer: Self-pay | Admitting: Cardiology

## 2018-10-21 ENCOUNTER — Ambulatory Visit: Payer: Medicare Other | Admitting: Allergy & Immunology

## 2018-10-27 DIAGNOSIS — I8312 Varicose veins of left lower extremity with inflammation: Secondary | ICD-10-CM | POA: Diagnosis not present

## 2018-10-27 DIAGNOSIS — M7981 Nontraumatic hematoma of soft tissue: Secondary | ICD-10-CM | POA: Diagnosis not present

## 2018-11-03 ENCOUNTER — Other Ambulatory Visit: Payer: Self-pay

## 2018-11-03 NOTE — Patient Outreach (Signed)
[facility]) Care Management  11/03/2018  [name] [name] 1945/05/16 OR:[id number]   Medication Adherence call to [name] [name] [facility] Identifiers Verify spoke with patient she is past due on Pravastatin 40 mg she explain she takes 1 tablet daily and has enough for three more month she explain she is still taking from the bottle she pick up in February/2020 and has not started it on the one she pick up in April/2020. [name] [name] is showing past due under [place].   [facility] Management Direct Dial [phone]  Fax [phone] [email or web address]

## 2018-11-18 ENCOUNTER — Other Ambulatory Visit: Payer: Self-pay

## 2018-11-18 ENCOUNTER — Ambulatory Visit (INDEPENDENT_AMBULATORY_CARE_PROVIDER_SITE_OTHER): Payer: Medicare Other | Admitting: Cardiology

## 2018-11-18 ENCOUNTER — Encounter: Payer: Self-pay | Admitting: Cardiology

## 2018-11-18 VITALS — BP 112/64 | HR 103 | Ht 63.0 in | Wt 185.0 lb

## 2018-11-18 DIAGNOSIS — I25119 Atherosclerotic heart disease of native coronary artery with unspecified angina pectoris: Secondary | ICD-10-CM

## 2018-11-18 DIAGNOSIS — Z23 Encounter for immunization: Secondary | ICD-10-CM

## 2018-11-18 DIAGNOSIS — E782 Mixed hyperlipidemia: Secondary | ICD-10-CM

## 2018-11-18 DIAGNOSIS — I480 Paroxysmal atrial fibrillation: Secondary | ICD-10-CM

## 2018-11-18 DIAGNOSIS — I495 Sick sinus syndrome: Secondary | ICD-10-CM

## 2018-11-18 NOTE — Patient Instructions (Signed)
Medication Instructions:  Continue all current medications.  Labwork: none  Testing/Procedures: none  Follow-Up: Your physician wants you to follow up in: 6 months.  You will receive a reminder letter in the mail one-two months in advance.  If you don't receive a letter, please call our office to schedule the follow up appointment   Any Other Special Instructions Will Be Listed Below (If Applicable).  If you need a refill on your cardiac medications before your next appointment, please call your pharmacy. s  

## 2018-11-18 NOTE — Progress Notes (Signed)
Cardiology Office Note  Date: 11/18/2018   ID: [name] [name], DOB 1946-02-17, MRN [id number]  PCP:  Curlene Labrum, MD  Cardiologist:  Rozann Lesches, MD Electrophysiologist:  None   Chief Complaint  Patient presents with  . Cardiac follow-up    History of Present Illness: [name] [name] is a 73 y.o. female last seen in July.  She presents with her husband for a follow-up visit.  She reports stable, chronic dyspnea on exertion, no recent palpitations or chest pain.  She admits that she is fairly sedentary at home.  She sees [name] in the device clinic, Medtronic pacemaker in place.  Her last device interrogation showed 5.3% atrial fibrillation burden.  At the last visit we increased Cardizem CD to 240 mg in the morning and 180 mg in the afternoon with paroxysmal to persistent atrial fibrillation.  I reviewed her remaining medications which are detailed below.  She reports no bleeding problems on Xarelto.  I went over her lab work from August.  Past Medical History:  Diagnosis Date  . Atrial fibrillation ([place])   . Atrial flutter ([place])   . Coronary atherosclerosis of native coronary artery    BMS to circumflex 9/08  . Hyperlipidemia   . Multiple thyroid nodules   . 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 Type 2 diabetes mellitus ([place])   . Urticaria     Past Surgical History:  Procedure Laterality Date  . BREAST LUMPECTOMY    . PACEMAKER PLACEMENT     Medtronic  . ROTATOR CUFF REPAIR    . VENTRAL HERNIA REPAIR    . VESICOVAGINAL FISTULA CLOSURE W/ TAH      Current Outpatient Medications  Medication Sig Dispense Refill  . augmented betamethasone dipropionate (DIPROLENE-AF) 0.05 % cream APPLY CREAM TOPICALLY TO AFFECTED AREA TWICE DAILY AS NEEDED FOR RASH (NOT TO FACE GROIN OR UNDERARMS)    . diltiazem (CARDIZEM CD) 180 MG 24 hr capsule Take 1 capsule (180 mg total) by mouth every evening. 90 capsule 3  . diltiazem (CARDIZEM CD) 240 MG 24 hr capsule Take  1 capsule (240 mg total) by mouth every morning. 90 capsule 3  . diphenhydrAMINE-zinc acetate (BENADRYL) cream Apply 1 application topically 3 (three) times daily as needed for itching.    . docusate sodium (COLACE) 100 MG capsule Take 100 mg by mouth daily.    Marland Kitchen dofetilide (TIKOSYN) 500 MCG capsule Take 1 capsule (500 mcg total) by mouth 2 (two) times daily. 180 capsule 3  . famotidine (PEPCID) 20 MG tablet Take 1 tablet (20 mg total) by mouth 2 (two) times daily. 60 tablet 2  . glipiZIDE (GLUCOTROL) 10 MG tablet Take 10 mg by mouth 2 (two) times daily before a meal.    . Hydrocortisone-Aloe Vera (CORTIZONE-10/ALOE) 1 % CREA Apply topically.    . nitroGLYCERIN (NITROSTAT) 0.4 MG SL tablet Place 1 tablet (0.4 mg total) under the tongue every 5 (five) minutes x 3 doses as needed for chest pain (if no relief after 3 rd dose, proceed to ED for an evaluation). 75 tablet 3  . potassium chloride SA (K-DUR) 20 MEQ tablet TAKE 1 TABLET BY MOUTH  EVERY OTHER DAY 45 tablet 3  . pravastatin (PRAVACHOL) 40 MG tablet TAKE 1 TABLET BY MOUTH  EVERY EVENING 90 tablet 3  . sitaGLIPtin (JANUVIA) 50 MG tablet Take 50 mg by mouth daily.    Marland Kitchen triamcinolone cream (KENALOG) 0.1 % APPLY TOPICALLY 3  TIMES DAILY    . XARELTO 20 MG TABS tablet TAKE 1 TABLET BY MOUTH  DAILY 90 tablet 3   No current facility-administered medications for this visit.    Allergies:  Codeine, Lisinopril, and Metformin and related   Social History: The patient  reports that she has never smoked. She has never used smokeless tobacco. She reports that she does not drink alcohol.   ROS:  Please see the history of present illness. Otherwise, complete review of systems is positive for none.  All other systems are reviewed and negative.   Physical Exam: VS:  BP 112/64   Pulse (!) 103   Ht 5\' 3"  (1.6 m)   Wt 185 lb (83.9 kg)   SpO2 98%   BMI 32.77 kg/m , BMI Body mass index is 32.77 kg/m.  Wt Readings from Last 3 Encounters:  11/18/18 185  lb (83.9 kg)  10/05/18 179 lb (81.2 kg)  09/05/18 178 lb (80.7 kg)    General: Patient appears comfortable at rest. HEENT: Conjunctiva and lids normal, wearing a mask. Neck: Supple, no elevated JVP or carotid bruits, no thyromegaly. Lungs: Clear to auscultation, nonlabored breathing at rest. Cardiac: Irregular, no S3 or significant systolic murmur, no pericardial rub. Abdomen: Soft, nontender, bowel sounds present. Extremities: Trace ankle edema, distal pulses 2+. Skin: Warm and dry. Musculoskeletal: No kyphosis. Neuropsychiatric: Alert and oriented x3, affect grossly appropriate.  ECG:  An ECG dated 09/05/2018 was personally reviewed today and demonstrated:  Sinus rhythm with leftward axis and nonspecific ST-T changes, QTc 369 ms.  Recent Labwork: 10/05/2018: ALT 15; AST 17; BUN 15; Creatinine, Ser 0.86; Hemoglobin 15.5; Platelets 202; Potassium 4.6; Sodium 144   Other Studies Reviewed Today:  Echocardiogram 09/17/2017: Study Conclusions  - Left ventricle: The cavity size was normal. Wall thickness was increased in a pattern of moderate LVH. Systolic function was normal. The estimated ejection fraction was in the range of 60% to 65%. Wall motion was normal; there were no regional wall motion abnormalities. The study is not technically sufficient to allow evaluation of LV diastolic function. - Aortic valve: Valve area (VTI): 3.43 cm^2. Valve area (Vmax): 3.06 cm^2.  Assessment and Plan:  1.  Paroxysmal atrial fibrillation with approximately 5% rhythm burden based on most recent device interrogation.  She continues on Eliquis for stroke prophylaxis and tolerating the increase in Cardizem CD made at the last visit.  No changes made today including continuation of Tikosyn..  I reviewed her lab work from August.  2.  CAD status post BMS to the circumflex.  She reports no active angina symptoms and remains on statin therapy.  Not on aspirin given concurrent use of Xarelto.   3.  Mixed hyperlipidemia on Pravachol.  Keep follow-up with Dr. Pleas Koch.  4.  Tachycardia-bradycardia syndrome status post pacemaker placement.  Keep follow-up with [name].  Medication Adjustments/Labs and Tests Ordered: Current medicines are reviewed at length with the patient today.  Concerns regarding medicines are outlined above.   Tests Ordered: No orders of the defined types were placed in this encounter.   Medication Changes: No orders of the defined types were placed in this encounter.   Disposition:  Follow up 6 months in the [place] office.  Signed, Satira Sark, MD, [facility] 11/18/2018 2:21 PM    [facility], [place], [place] [address] Phone: [phone]; Fax: [phone]

## 2018-12-12 DIAGNOSIS — I83892 Varicose veins of left lower extremities with other complications: Secondary | ICD-10-CM | POA: Diagnosis not present

## 2018-12-12 DIAGNOSIS — I8311 Varicose veins of right lower extremity with inflammation: Secondary | ICD-10-CM | POA: Diagnosis not present

## 2018-12-13 ENCOUNTER — Ambulatory Visit: Payer: Medicare Other | Admitting: Cardiology

## 2018-12-15 ENCOUNTER — Ambulatory Visit: Payer: Medicare Other | Admitting: Allergy

## 2018-12-16 DIAGNOSIS — I10 Essential (primary) hypertension: Secondary | ICD-10-CM | POA: Diagnosis not present

## 2018-12-16 DIAGNOSIS — E1165 Type 2 diabetes mellitus with hyperglycemia: Secondary | ICD-10-CM | POA: Diagnosis not present

## 2018-12-19 DIAGNOSIS — E119 Type 2 diabetes mellitus without complications: Secondary | ICD-10-CM | POA: Diagnosis not present

## 2018-12-19 DIAGNOSIS — E118 Type 2 diabetes mellitus with unspecified complications: Secondary | ICD-10-CM | POA: Diagnosis not present

## 2018-12-19 DIAGNOSIS — E782 Mixed hyperlipidemia: Secondary | ICD-10-CM | POA: Diagnosis not present

## 2018-12-20 DIAGNOSIS — D751 Secondary polycythemia: Secondary | ICD-10-CM | POA: Diagnosis not present

## 2018-12-27 ENCOUNTER — Ambulatory Visit (INDEPENDENT_AMBULATORY_CARE_PROVIDER_SITE_OTHER): Payer: Medicare Other | Admitting: *Deleted

## 2018-12-27 DIAGNOSIS — I495 Sick sinus syndrome: Secondary | ICD-10-CM

## 2018-12-27 DIAGNOSIS — I480 Paroxysmal atrial fibrillation: Secondary | ICD-10-CM

## 2018-12-27 LAB — CUP PACEART REMOTE DEVICE CHECK
Battery Impedance: 1594 Ohm
Battery Remaining Longevity: 41 mo
Battery Voltage: 2.76 V
Brady Statistic AP VP Percent: 0 %
Brady Statistic AP VS Percent: 59 %
Brady Statistic AS VP Percent: 0 %
Brady Statistic AS VS Percent: 40 %
Date Time Interrogation Session: 20201110204922
Implantable Lead Implant Date: 20110510
Implantable Lead Implant Date: 20110510
Implantable Lead Location: 753859
Implantable Lead Location: 753860
Implantable Lead Model: 5076
Implantable Lead Model: 5092
Implantable Pulse Generator Implant Date: 20110510
Lead Channel Impedance Value: 441 Ohm
Lead Channel Impedance Value: 537 Ohm
Lead Channel Pacing Threshold Amplitude: 0.5 V
Lead Channel Pacing Threshold Amplitude: 1.125 V
Lead Channel Pacing Threshold Pulse Width: 0.4 ms
Lead Channel Pacing Threshold Pulse Width: 0.4 ms
Lead Channel Setting Pacing Amplitude: 2 V
Lead Channel Setting Pacing Amplitude: 2.5 V
Lead Channel Setting Pacing Pulse Width: 0.4 ms
Lead Channel Setting Sensing Sensitivity: 2.8 mV

## 2019-01-02 ENCOUNTER — Other Ambulatory Visit: Payer: Self-pay | Admitting: Cardiology

## 2019-01-03 ENCOUNTER — Other Ambulatory Visit: Payer: Self-pay | Admitting: Internal Medicine

## 2019-01-03 DIAGNOSIS — E119 Type 2 diabetes mellitus without complications: Secondary | ICD-10-CM | POA: Diagnosis not present

## 2019-01-03 DIAGNOSIS — I2510 Atherosclerotic heart disease of native coronary artery without angina pectoris: Secondary | ICD-10-CM | POA: Diagnosis not present

## 2019-01-03 DIAGNOSIS — I480 Paroxysmal atrial fibrillation: Secondary | ICD-10-CM | POA: Diagnosis not present

## 2019-01-03 DIAGNOSIS — E782 Mixed hyperlipidemia: Secondary | ICD-10-CM | POA: Diagnosis not present

## 2019-01-03 MED ORDER — DOFETILIDE 500 MCG PO CAPS: 500.0000 ug | ORAL_CAPSULE | Freq: Two times a day (BID) | ORAL | 3 refills | Status: DC

## 2019-01-03 NOTE — Telephone Encounter (Signed)
dofetilide (TIKOSYN) 500 MCG capsule  [facility] asking for pre-authorization per patient

## 2019-01-16 NOTE — Progress Notes (Signed)
Remote pacemaker transmission.   

## 2019-01-26 DIAGNOSIS — E042 Nontoxic multinodular goiter: Secondary | ICD-10-CM | POA: Diagnosis not present

## 2019-02-07 DIAGNOSIS — T464X5A Adverse effect of angiotensin-converting-enzyme inhibitors, initial encounter: Secondary | ICD-10-CM | POA: Diagnosis not present

## 2019-02-07 DIAGNOSIS — R05 Cough: Secondary | ICD-10-CM | POA: Diagnosis not present

## 2019-02-07 DIAGNOSIS — E049 Nontoxic goiter, unspecified: Secondary | ICD-10-CM | POA: Diagnosis not present

## 2019-02-15 ENCOUNTER — Telehealth: Payer: Self-pay | Admitting: Cardiology

## 2019-02-15 NOTE — Telephone Encounter (Signed)
Patient called wanting to know if Dr. Domenic Polite would approve for her to receive the COVID injection.

## 2019-02-15 NOTE — Telephone Encounter (Signed)
Pt wanted to know if we were getting COVID vaccine to distribute to patients - aware that we were not aware at this time if we would be getting the vaccine to give to pts and she should contact pcp

## 2019-02-16 DIAGNOSIS — I10 Essential (primary) hypertension: Secondary | ICD-10-CM | POA: Diagnosis not present

## 2019-02-16 DIAGNOSIS — I480 Paroxysmal atrial fibrillation: Secondary | ICD-10-CM | POA: Diagnosis not present

## 2019-03-17 DIAGNOSIS — I10 Essential (primary) hypertension: Secondary | ICD-10-CM | POA: Diagnosis not present

## 2019-03-17 DIAGNOSIS — E1165 Type 2 diabetes mellitus with hyperglycemia: Secondary | ICD-10-CM | POA: Diagnosis not present

## 2019-03-28 ENCOUNTER — Ambulatory Visit (INDEPENDENT_AMBULATORY_CARE_PROVIDER_SITE_OTHER): Payer: Medicare Other | Admitting: *Deleted

## 2019-03-28 DIAGNOSIS — Z950 Presence of cardiac pacemaker: Secondary | ICD-10-CM | POA: Diagnosis not present

## 2019-03-28 LAB — CUP PACEART REMOTE DEVICE CHECK
Battery Impedance: 1738 Ohm
Battery Remaining Longevity: 38 mo
Battery Voltage: 2.76 V
Brady Statistic AP VP Percent: 0 %
Brady Statistic AP VS Percent: 62 %
Brady Statistic AS VP Percent: 0 %
Brady Statistic AS VS Percent: 37 %
Date Time Interrogation Session: 20210209133709
Implantable Lead Implant Date: 20110510
Implantable Lead Implant Date: 20110510
Implantable Lead Location: 753859
Implantable Lead Location: 753860
Implantable Lead Model: 5076
Implantable Lead Model: 5092
Implantable Pulse Generator Implant Date: 20110510
Lead Channel Impedance Value: 459 Ohm
Lead Channel Impedance Value: 532 Ohm
Lead Channel Pacing Threshold Amplitude: 0.5 V
Lead Channel Pacing Threshold Amplitude: 1.125 V
Lead Channel Pacing Threshold Pulse Width: 0.4 ms
Lead Channel Pacing Threshold Pulse Width: 0.4 ms
Lead Channel Setting Pacing Amplitude: 2 V
Lead Channel Setting Pacing Amplitude: 2.5 V
Lead Channel Setting Pacing Pulse Width: 0.4 ms
Lead Channel Setting Sensing Sensitivity: 2.8 mV

## 2019-03-29 NOTE — Progress Notes (Signed)
PPM Remote  

## 2019-04-13 DIAGNOSIS — I480 Paroxysmal atrial fibrillation: Secondary | ICD-10-CM | POA: Diagnosis not present

## 2019-04-13 DIAGNOSIS — I10 Essential (primary) hypertension: Secondary | ICD-10-CM | POA: Diagnosis not present

## 2019-04-13 DIAGNOSIS — E782 Mixed hyperlipidemia: Secondary | ICD-10-CM | POA: Diagnosis not present

## 2019-04-13 DIAGNOSIS — E1165 Type 2 diabetes mellitus with hyperglycemia: Secondary | ICD-10-CM | POA: Diagnosis not present

## 2019-04-19 ENCOUNTER — Ambulatory Visit (INDEPENDENT_AMBULATORY_CARE_PROVIDER_SITE_OTHER): Payer: Medicare Other | Admitting: Podiatry

## 2019-04-19 ENCOUNTER — Other Ambulatory Visit: Payer: Self-pay

## 2019-04-19 ENCOUNTER — Ambulatory Visit (INDEPENDENT_AMBULATORY_CARE_PROVIDER_SITE_OTHER): Payer: Medicare Other

## 2019-04-19 ENCOUNTER — Encounter: Payer: Self-pay | Admitting: Podiatry

## 2019-04-19 VITALS — Temp 98.4°F

## 2019-04-19 DIAGNOSIS — M79675 Pain in left toe(s): Secondary | ICD-10-CM | POA: Diagnosis not present

## 2019-04-19 DIAGNOSIS — M79674 Pain in right toe(s): Secondary | ICD-10-CM | POA: Diagnosis not present

## 2019-04-19 DIAGNOSIS — D689 Coagulation defect, unspecified: Secondary | ICD-10-CM

## 2019-04-19 DIAGNOSIS — M2041 Other hammer toe(s) (acquired), right foot: Secondary | ICD-10-CM | POA: Diagnosis not present

## 2019-04-19 DIAGNOSIS — L84 Corns and callosities: Secondary | ICD-10-CM

## 2019-04-19 DIAGNOSIS — B351 Tinea unguium: Secondary | ICD-10-CM | POA: Diagnosis not present

## 2019-04-19 DIAGNOSIS — M779 Enthesopathy, unspecified: Secondary | ICD-10-CM | POA: Diagnosis not present

## 2019-04-19 NOTE — Progress Notes (Signed)
Subjective:   Patient ID: [name] [name], female   DOB: 74 y.o.   MRN: [id number]   HPI Patient presents with exquisite discomfort between the hallux and second toe with inflammation fluid buildup and obvious bone spur formation.  Also is noted to have nail disease 1-5 both feet that she cannot take care of of an lesions that become painful   ROS      Objective:  Physical Exam  Neurovascular status intact with digital deformities with rotation of the hallux against the second toe right with prominence of the bone structure with inflammation of the inner phalangeal joint capsule hallux and second digit right that are painful and thick yellow brittle nailbeds 1-5 both feet along with keratotic lesion plantar both feet that are painful     Assessment:  Hammertoe deformity with inflammatory capsulitis hallux right lateral inner phalangeal joint nail disease with thickness yellow mycotic debris and pain 1-5 both feet and lesions bilateral     Plan:  H&P all conditions reviewed and careful sterile injection of the phalangeal joint right hallux lateral side accomplished with padding therapy debridement of nailbeds 1-5 both feet and lesions bilateral with no iatrogenic bleeding noted.  Reappoint routine care ultimately may require digital correction which I educated her on today  X-rays indicate that there is compression between the hallux and second digit with spur formation noted between the 2 toes

## 2019-04-20 DIAGNOSIS — I10 Essential (primary) hypertension: Secondary | ICD-10-CM | POA: Diagnosis not present

## 2019-04-20 DIAGNOSIS — E782 Mixed hyperlipidemia: Secondary | ICD-10-CM | POA: Diagnosis not present

## 2019-04-20 DIAGNOSIS — I480 Paroxysmal atrial fibrillation: Secondary | ICD-10-CM | POA: Diagnosis not present

## 2019-04-20 DIAGNOSIS — E1165 Type 2 diabetes mellitus with hyperglycemia: Secondary | ICD-10-CM | POA: Diagnosis not present

## 2019-05-17 DIAGNOSIS — E7849 Other hyperlipidemia: Secondary | ICD-10-CM | POA: Diagnosis not present

## 2019-05-17 DIAGNOSIS — I10 Essential (primary) hypertension: Secondary | ICD-10-CM | POA: Diagnosis not present

## 2019-06-04 ENCOUNTER — Other Ambulatory Visit: Payer: Self-pay | Admitting: Cardiology

## 2019-06-06 DIAGNOSIS — I480 Paroxysmal atrial fibrillation: Secondary | ICD-10-CM | POA: Diagnosis not present

## 2019-06-06 DIAGNOSIS — E782 Mixed hyperlipidemia: Secondary | ICD-10-CM | POA: Diagnosis not present

## 2019-06-06 DIAGNOSIS — I10 Essential (primary) hypertension: Secondary | ICD-10-CM | POA: Diagnosis not present

## 2019-06-06 DIAGNOSIS — E1165 Type 2 diabetes mellitus with hyperglycemia: Secondary | ICD-10-CM | POA: Diagnosis not present

## 2019-06-07 ENCOUNTER — Encounter: Payer: Self-pay | Admitting: Gastroenterology

## 2019-06-11 DIAGNOSIS — E119 Type 2 diabetes mellitus without complications: Secondary | ICD-10-CM | POA: Diagnosis not present

## 2019-06-16 DIAGNOSIS — I10 Essential (primary) hypertension: Secondary | ICD-10-CM | POA: Diagnosis not present

## 2019-06-16 DIAGNOSIS — I480 Paroxysmal atrial fibrillation: Secondary | ICD-10-CM | POA: Diagnosis not present

## 2019-06-23 ENCOUNTER — Telehealth: Payer: Medicare Other | Admitting: Internal Medicine

## 2019-06-26 ENCOUNTER — Other Ambulatory Visit: Payer: Self-pay

## 2019-06-26 ENCOUNTER — Encounter: Payer: Self-pay | Admitting: Internal Medicine

## 2019-06-26 ENCOUNTER — Telehealth (INDEPENDENT_AMBULATORY_CARE_PROVIDER_SITE_OTHER): Payer: Medicare Other | Admitting: Internal Medicine

## 2019-06-26 VITALS — BP 159/92 | HR 65 | Ht 63.0 in | Wt 177.0 lb

## 2019-06-26 DIAGNOSIS — I480 Paroxysmal atrial fibrillation: Secondary | ICD-10-CM

## 2019-06-26 DIAGNOSIS — I2510 Atherosclerotic heart disease of native coronary artery without angina pectoris: Secondary | ICD-10-CM | POA: Diagnosis not present

## 2019-06-26 DIAGNOSIS — I495 Sick sinus syndrome: Secondary | ICD-10-CM | POA: Diagnosis not present

## 2019-06-26 NOTE — Progress Notes (Signed)
Electrophysiology TeleHealth Note  Due to national recommendations of social distancing due to [place] 19, an audio telehealth visit is felt to be most appropriate for this patient at this time.  Verbal consent was obtained by me for the telehealth visit today.  The patient does not have capability for a virtual visit.  A phone visit is therefore required today.   Date:  06/26/2019   ID:  [name] [name], DOB 07/06/1945, MRN [id number]  Location: patient's home  Provider location:  [place]  Evaluation Performed: Follow-up visit  PCP:  Curlene Labrum, MD   Electrophysiologist:  Dr Rayann Heman  Chief Complaint:  Pacemaker follow up  History of Present Illness:    [name] [name] is a 74 y.o. female who presents via telehealth conferencing today.  Since last being seen in our clinic, the patient reports doing very well.  Today, she denies symptoms of palpitations, chest pain, shortness of breath,  lower extremity edema, dizziness, presyncope, or syncope.  The patient is otherwise without complaint today.     Past Medical History:  Diagnosis Date  . Atrial fibrillation ([place])   . Atrial flutter ([place])   . Coronary atherosclerosis of native coronary artery    BMS to circumflex 9/08  . Hyperlipidemia   . Multiple thyroid nodules   . 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 Type 2 diabetes mellitus ([place])   . Urticaria     Past Surgical History:  Procedure Laterality Date  . BREAST LUMPECTOMY    . PACEMAKER PLACEMENT     Medtronic  . ROTATOR CUFF REPAIR    . VENTRAL HERNIA REPAIR    . VESICOVAGINAL FISTULA CLOSURE W/ TAH      Current Outpatient Medications  Medication Sig Dispense Refill  . APPLE CIDER VINEGAR PO Take 1 tablet by mouth daily.    Marland Kitchen diltiazem (CARDIZEM CD) 180 MG 24 hr capsule Take 1 capsule (180 mg total) by mouth every evening. 90 capsule 3  . diphenhydrAMINE-zinc acetate (BENADRYL) cream Apply 1 application topically 3 (three) times daily as needed  for itching.    . docusate sodium (COLACE) 100 MG capsule Take 100 mg by mouth daily.    Marland Kitchen dofetilide (TIKOSYN) 500 MCG capsule Take 1 capsule (500 mcg total) by mouth 2 (two) times daily. 180 capsule 3  . ergocalciferol (VITAMIN D2) 1.25 MG (50000 UT) capsule Take 50,000 Units by mouth once a week.    Marland Kitchen glipiZIDE (GLUCOTROL) 10 MG tablet Take 10 mg by mouth 2 (two) times daily before a meal.    . nitroGLYCERIN (NITROSTAT) 0.4 MG SL tablet Place 1 tablet (0.4 mg total) under the tongue every 5 (five) minutes x 3 doses as needed for chest pain (if no relief after 3 rd dose, proceed to ED for an evaluation). 75 tablet 3  . potassium chloride SA (K-DUR) 20 MEQ tablet TAKE 1 TABLET BY MOUTH  EVERY OTHER DAY 45 tablet 3  . pravastatin (PRAVACHOL) 40 MG tablet TAKE 1 TABLET BY MOUTH IN  THE EVENING 90 tablet 3  . Semaglutide (RYBELSUS) 3 MG TABS Take 3 mg by mouth daily.    [name] 20 MG TABS tablet TAKE 1 TABLET BY MOUTH  DAILY 90 tablet 3  . diltiazem (CARDIZEM CD) 240 MG 24 hr capsule Take 1 capsule (240 mg total) by mouth every morning. 90 capsule 3   No current facility-administered medications for this visit.    Allergies:  Codeine, Lisinopril, and Metformin and related   Social History:  The patient  reports that she has never smoked. She has never used smokeless tobacco. She reports that she does not drink alcohol.   ROS:  Please see the history of present illness.   All other systems are personally reviewed and negative.    Exam:    Vital Signs:  BP (!) 159/92   Pulse 65   Ht 5\' 3"  (1.6 m)   Wt 177 lb (80.3 kg)   BMI 31.35 kg/m   Well sounding, alert and conversant   Labs/Other Tests and Data Reviewed:    Recent Labs: 10/05/2018: ALT 15; BUN 15; Creatinine, Ser 0.86; Hemoglobin 15.5; Platelets 202; Potassium 4.6; Sodium 144   Wt Readings from Last 3 Encounters:  06/26/19 177 lb (80.3 kg)  11/18/18 185 lb (83.9 kg)  10/05/18 179 lb (81.2 kg)     Last device remote is  reviewed from [place] PDF which reveals normal device function    ASSESSMENT & PLAN:    1.  Paroxysmal atrial fibrillation Doing well Tolerating Xarelto Continue Tikosyn - needs follow up labs and EKG - will have [facility] office arrange for sometime in the next few weeks  2.  CAD No recent ischemic symptoms  3.  Sick sinus syndrome Normal device function by recent remote Due for next remote tomorrow    Risks, benefits and potential toxicities for medications prescribed and/or refilled reviewed with patient today.   Follow-up:  Remotes, with me in a year   Patient Risk:  after full review of this patients clinical status, I feel that they are at moderate risk at this time.  Today, I have spent 15 minutes with the patient with telehealth technology discussing arrhythmia management .    Army Fossa, MD  06/26/2019 11:04 AM     [facility] [address] [place] [address] [phone] (office) [phone] (fax)

## 2019-06-27 ENCOUNTER — Ambulatory Visit (INDEPENDENT_AMBULATORY_CARE_PROVIDER_SITE_OTHER): Payer: Medicare Other | Admitting: *Deleted

## 2019-06-27 DIAGNOSIS — I495 Sick sinus syndrome: Secondary | ICD-10-CM

## 2019-06-27 DIAGNOSIS — I480 Paroxysmal atrial fibrillation: Secondary | ICD-10-CM

## 2019-06-27 LAB — CUP PACEART REMOTE DEVICE CHECK
Battery Impedance: 1974 Ohm
Battery Remaining Longevity: 33 mo
Battery Voltage: 2.76 V
Brady Statistic AP VP Percent: 0 %
Brady Statistic AP VS Percent: 64 %
Brady Statistic AS VP Percent: 0 %
Brady Statistic AS VS Percent: 36 %
Date Time Interrogation Session: 20210511091311
Implantable Lead Implant Date: 20110510
Implantable Lead Implant Date: 20110510
Implantable Lead Location: 753859
Implantable Lead Location: 753860
Implantable Lead Model: 5076
Implantable Lead Model: 5092
Implantable Pulse Generator Implant Date: 20110510
Lead Channel Impedance Value: 460 Ohm
Lead Channel Impedance Value: 527 Ohm
Lead Channel Pacing Threshold Amplitude: 0.5 V
Lead Channel Pacing Threshold Amplitude: 1.125 V
Lead Channel Pacing Threshold Pulse Width: 0.4 ms
Lead Channel Pacing Threshold Pulse Width: 0.4 ms
Lead Channel Setting Pacing Amplitude: 2 V
Lead Channel Setting Pacing Amplitude: 2.5 V
Lead Channel Setting Pacing Pulse Width: 0.4 ms
Lead Channel Setting Sensing Sensitivity: 2.8 mV

## 2019-06-29 ENCOUNTER — Other Ambulatory Visit: Payer: Self-pay

## 2019-06-29 ENCOUNTER — Ambulatory Visit: Payer: Medicare Other | Admitting: Gastroenterology

## 2019-06-29 ENCOUNTER — Encounter: Payer: Self-pay | Admitting: Gastroenterology

## 2019-06-29 DIAGNOSIS — K642 Third degree hemorrhoids: Secondary | ICD-10-CM | POA: Diagnosis not present

## 2019-06-29 DIAGNOSIS — K625 Hemorrhage of anus and rectum: Secondary | ICD-10-CM

## 2019-06-29 MED ORDER — HYDROCORTISONE (PERIANAL) 2.5 % EX CREA
1.0000 | TOPICAL_CREAM | Freq: Two times a day (BID) | CUTANEOUS | 1 refills | Status: DC
Start: 2019-06-29 — End: 2019-08-31

## 2019-06-29 NOTE — Progress Notes (Addendum)
Primary Care Physician:  Curlene Labrum, MD  Referring Physician: Dr. Pleas Koch Primary Gastroenterologist:  Dr. Gala Romney  Chief Complaint  Patient presents with  . Hemorrhoids    rectal bleeding 2 weeks ago    HPI:   [name] [name] is a 74 y.o. female presenting today at the request of Dr. Pleas Koch due to rectal bleeding. Last colonoscopy many years ago in [place], possible polyps per patient. Chronic history of rectal bleeding.  No rectal pain or itching. No burning. Last bleeding 2 weeks ago. Feels like something is sticking out all the time. Will push back in and comes right back out. Will see blood at night for about a month and then stop for several months. Sometimes a year without blood. Not associated with BMs. Will see blood in toilet while urinating. Large amount. Bright red blood. No abdominal pain. No weight loss or lack of appetite. No upper GI concerns, denying dysphagia, GERD, N/V. Once in a blue moon will take an OTC Ibuprofen.   Past Medical History:  Diagnosis Date  . Atrial fibrillation ([place])   . Atrial flutter ([place])   . Coronary atherosclerosis of native coronary artery    BMS to circumflex 9/08  . HTN (hypertension)   . Hyperlipidemia   . Multiple thyroid nodules   . 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 Type 2 diabetes mellitus ([place])   . Urticaria     Past Surgical History:  Procedure Laterality Date  . BREAST LUMPECTOMY    . PACEMAKER PLACEMENT     Medtronic  . ROTATOR CUFF REPAIR    . VENTRAL HERNIA REPAIR    . VESICOVAGINAL FISTULA CLOSURE W/ TAH      Current Outpatient Medications  Medication Sig Dispense Refill  . APPLE CIDER VINEGAR PO Take 1 tablet by mouth daily.    . Cholecalciferol (D-3-5) 125 MCG (5000 UT) capsule Take 5,000 Units by mouth daily.    Marland Kitchen diltiazem (CARDIZEM CD) 180 MG 24 hr capsule Take 1 capsule (180 mg total) by mouth every evening. 90 capsule 3  . diltiazem (CARDIZEM CD) 240 MG 24 hr capsule Take 1 capsule (240  mg total) by mouth every morning. 90 capsule 3  . docusate sodium (COLACE) 100 MG capsule Take 100 mg by mouth daily.    Marland Kitchen dofetilide (TIKOSYN) 500 MCG capsule Take 1 capsule (500 mcg total) by mouth 2 (two) times daily. 180 capsule 3  . glipiZIDE (GLUCOTROL) 10 MG tablet Take 10 mg by mouth 2 (two) times daily before a meal.    . Misc Natural Products (LEG VEIN & CIRCULATION PO) Take by mouth every other day.    . nitroGLYCERIN (NITROSTAT) 0.4 MG SL tablet Place 1 tablet (0.4 mg total) under the tongue every 5 (five) minutes x 3 doses as needed for chest pain (if no relief after 3 rd dose, proceed to ED for an evaluation). 75 tablet 3  . potassium chloride SA (K-DUR) 20 MEQ tablet TAKE 1 TABLET BY MOUTH  EVERY OTHER DAY 45 tablet 3  . pravastatin (PRAVACHOL) 40 MG tablet TAKE 1 TABLET BY MOUTH IN  THE EVENING 90 tablet 3  . Semaglutide (RYBELSUS) 3 MG TABS Take 3 mg by mouth daily.    [name] 20 MG TABS tablet TAKE 1 TABLET BY MOUTH  DAILY 90 tablet 3  . hydrocortisone (ANUSOL-HC) 2.5 % rectal cream Place 1 application rectally 2 (two) times daily. 30 g 1  No current facility-administered medications for this visit.    Allergies as of 06/29/2019 - Review Complete 06/29/2019  Allergen Reaction Noted  . Codeine  06/05/2008  . Lisinopril  03/07/2012  . Metformin and related  09/30/2015    Family History  Problem Relation Age of Onset  . Heart disease Father   . Heart disease Other        one sibling  . Colon cancer Neg Hx   . Colon polyps Neg Hx     Social History   Socioeconomic History  . Marital status: Married    Spouse name: Not on file  . Number of children: Not on file  . Years of education: Not on file  . Highest education level: Not on file  Occupational History  . Occupation: retired    Comment: [employer]  Tobacco Use  . Smoking status: Never Smoker  . Smokeless tobacco: Never Used  Substance and Sexual Activity  . Alcohol use: Never    Alcohol/week: 0.0  standard drinks  . Drug use: Never  . Sexual activity: Not on file  Other Topics Concern  . Not on file  Social History Narrative  . Not on file   Social Determinants of Health   Financial Resource Strain:   . Difficulty of Paying Living Expenses:   Food Insecurity:   . Worried About Charity fundraiser in the Last Year:   . Arboriculturist in the Last Year:   Transportation Needs:   . Film/video editor (Medical):   Marland Kitchen Lack of Transportation (Non-Medical):   Physical Activity:   . Days of Exercise per Week:   . Minutes of Exercise per Session:   Stress:   . Feeling of Stress :   Social Connections:   . Frequency of Communication with Friends and Family:   . Frequency of Social Gatherings with Friends and Family:   . Attends Religious Services:   . Active Member of Clubs or Organizations:   . Attends Archivist Meetings:   Marland Kitchen Marital Status:   Intimate Partner Violence:   . Fear of Current or Ex-Partner:   . Emotionally Abused:   Marland Kitchen Physically Abused:   . Sexually Abused:     Review of Systems: Gen: Denies any fever, chills, fatigue, weight loss, lack of appetite.  CV: Denies chest pain, heart palpitations, peripheral edema, syncope.  Resp: Denies shortness of breath at rest or with exertion. Denies wheezing or cough.  GI: see HPI GU : Denies urinary burning, urinary frequency, urinary hesitancy MS: Denies joint pain, muscle weakness, cramps, or limitation of movement.  Derm: Denies rash, itching, dry skin Psych: Denies depression, anxiety, memory loss, and confusion Heme: see HPI  Physical Exam: BP (!) 189/78   Pulse 84   Temp (!) 96.9 F (36.1 C) (Temporal)   Ht 5\' 3"  (1.6 m)   Wt 179 lb (81.2 kg)   BMI 31.71 kg/m  General:   Alert and oriented. Pleasant and cooperative. Well-nourished and well-developed.  Head:  Normocephalic and atraumatic. Eyes:  Without icterus, sclera clear and conjunctiva pink.  Ears:  Normal auditory acuity. Mouth:  No  deformity or lesions, oral mucosa pink.  Lungs:  Clear to auscultation bilaterally. No wheezes, rales, or rhonchi. No distress.  Heart:  S1, S2 present, irregularly irregular Abdomen:  +BS, soft, non-tender and non-distended. No HSM noted. No guarding or rebound. No masses appreciated.  Rectal:  External skin tags, non-thrombosed hemorrhoids. Grade 3 prolapsing hemorrhoid  easily reducible ?right anterior Msk:  Symmetrical without gross deformities. Normal posture. Extremities:  Without edema. Neurologic:  Alert and  oriented x4;  grossly normal neurologically. Skin:  Intact without significant lesions or rashes. Psych:  Alert and cooperative. Normal mood and affect.  ASSESSMENT: [name] [name] is a 74 y.o. female presenting today at the request of Dr. Pleas Koch due to chronic intermittent rectal bleeding. She reports a colonoscopy in remote past in [place] and believe she may have had polyps; we are requesting this report.   Clinically and on physical exam, she does have Grade 3 hemorrhoids easily reducible. This is likely source of chronic low-volume rectal bleeding, but further evaluation recommended as last colonoscopy in remote past. She is on Xarelto for afib, which will be held 48 hours prior to colonoscopy.    PLAN: Proceed with TCS with Dr. Gala Romney in near future with Propofol: the risks, benefits, and alternatives have been discussed with the patient in detail. The patient states understanding and desires to proceed.  Hold Xarelto 48 hours prior  Anusol BID sent to pharmacy  Attempt to retrieve outside records from last colonoscopy: addendum, received reports. Last colonoscopy 2014 by Dr. Britta Mccreedy. Internal hemorrhoids, sigmoid diverticulosis, multiple sessile polyps, tubular adenomas.   Further recommendations to follow.   Annitta Needs, PhD, ANP-BC [facility]

## 2019-06-29 NOTE — Progress Notes (Signed)
Remote pacemaker transmission.   

## 2019-06-29 NOTE — Patient Instructions (Addendum)
We are scheduling a colonoscopy in the near future with Dr. Gala Romney. You will stop Xarelto 2 days before the procedure.  I have sent in a cream to take twice a day per rectum when you have bleeding again.  Avoid sitting on toilet for more than 2-3 minutes at a time. Avoid straining and constipation.   Further recommendations to follow!  It was a pleasure to see you today. I want to create trusting relationships with patients to provide genuine, compassionate, and quality care. I value your feedback. If you receive a survey regarding your visit,  I greatly appreciate you taking time to fill this out.   Annitta Needs, PhD, ANP-BC [facility]    Hemorrhoids Hemorrhoids are swollen veins in and around the rectum or anus. There are two types of hemorrhoids:  Internal hemorrhoids. These occur in the veins that are just inside the rectum. They may poke through to the outside and become irritated and painful.  External hemorrhoids. These occur in the veins that are outside the anus and can be felt as a painful swelling or hard lump near the anus. Most hemorrhoids do not cause serious problems, and they can be managed with home treatments such as diet and lifestyle changes. If home treatments do not help the symptoms, procedures can be done to shrink or remove the hemorrhoids. What are the causes? This condition is caused by increased pressure in the anal area. This pressure may result from various things, including:  Constipation.  Straining to have a bowel movement.  Diarrhea.  Pregnancy.  Obesity.  Sitting for long periods of time.  Heavy lifting or other activity that causes you to strain.  Anal sex.  Riding a bike for a long period of time. What are the signs or symptoms? Symptoms of this condition include:  Pain.  Anal itching or irritation.  Rectal bleeding.  Leakage of stool (feces).  Anal swelling.  One or more lumps around the anus. How is this  diagnosed? This condition can often be diagnosed through a visual exam. Other exams or tests may also be done, such as:  An exam that involves feeling the rectal area with a gloved hand (digital rectal exam).  An exam of the anal canal that is done using a small tube (anoscope).  A blood test, if you have lost a significant amount of blood.  A test to look inside the colon using a flexible tube with a camera on the end (sigmoidoscopy or colonoscopy). How is this treated? This condition can usually be treated at home. However, various procedures may be done if dietary changes, lifestyle changes, and other home treatments do not help your symptoms. These procedures can help make the hemorrhoids smaller or remove them completely. Some of these procedures involve surgery, and others do not. Common procedures include:  Rubber band ligation. Rubber bands are placed at the base of the hemorrhoids to cut off their blood supply.  Sclerotherapy. Medicine is injected into the hemorrhoids to shrink them.  Infrared coagulation. A type of light energy is used to get rid of the hemorrhoids.  Hemorrhoidectomy surgery. The hemorrhoids are surgically removed, and the veins that supply them are tied off.  Stapled hemorrhoidopexy surgery. The surgeon staples the base of the hemorrhoid to the rectal wall. Follow these instructions at home: Eating and drinking   Eat foods that have a lot of fiber in them, such as whole grains, beans, nuts, fruits, and vegetables.  Ask your health care provider  about taking products that have added fiber (fiber supplements).  Reduce the amount of fat in your diet. You can do this by eating low-fat dairy products, eating less red meat, and avoiding processed foods.  Drink enough fluid to keep your urine pale yellow. Managing pain and swelling   Take warm sitz baths for 20 minutes, 3-4 times a day to ease pain and discomfort. You may do this in a bathtub or using a  portable sitz bath that fits over the toilet.  If directed, apply ice to the affected area. Using ice packs between sitz baths may be helpful. ? Put ice in a plastic bag. ? Place a towel between your skin and the bag. ? Leave the ice on for 20 minutes, 2-3 times a day. General instructions  Take over-the-counter and prescription medicines only as told by your health care provider.  Use medicated creams or suppositories as told.  Get regular exercise. Ask your health care provider how much and what kind of exercise is best for you. In general, you should do moderate exercise for at least 30 minutes on most days of the week (150 minutes each week). This can include activities such as walking, biking, or yoga.  Go to the bathroom when you have the urge to have a bowel movement. Do not wait.  Avoid straining to have bowel movements.  Keep the anal area dry and clean. Use wet toilet paper or moist towelettes after a bowel movement.  Do not sit on the toilet for long periods of time. This increases blood pooling and pain.  Keep all follow-up visits as told by your health care provider. This is important. Contact a health care provider if you have:  Increasing pain and swelling that are not controlled by treatment or medicine.  Difficulty having a bowel movement, or you are unable to have a bowel movement.  Pain or inflammation outside the area of the hemorrhoids. Get help right away if you have:  Uncontrolled bleeding from your rectum. Summary  Hemorrhoids are swollen veins in and around the rectum or anus.  Most hemorrhoids can be managed with home treatments such as diet and lifestyle changes.  Taking warm sitz baths can help ease pain and discomfort.  In severe cases, procedures or surgery can be done to shrink or remove the hemorrhoids. This information is not intended to replace advice given to you by your health care provider. Make sure you discuss any questions you have  with your health care provider. Document Revised: 07/01/2018 Document Reviewed: 06/24/2017 Elsevier Patient Education  [place].

## 2019-06-30 ENCOUNTER — Telehealth: Payer: Self-pay | Admitting: *Deleted

## 2019-06-30 DIAGNOSIS — I10 Essential (primary) hypertension: Secondary | ICD-10-CM

## 2019-06-30 DIAGNOSIS — Z79899 Other long term (current) drug therapy: Secondary | ICD-10-CM

## 2019-06-30 NOTE — Telephone Encounter (Signed)
Nurse visit scheduled for EKG for Tuesday, 07/04/2019 at 8:30 am in the [place] office.  Lab orders will be given to her at that time - states she will go to [facility] on 07/05/2019 to have them done.

## 2019-06-30 NOTE — Telephone Encounter (Signed)
-----   Message from Thompson Grayer, MD sent at 06/26/2019 11:07 AM EDT ----- Please order bmet, and mg.  Also needs a 12 lead ekg on tikosyn.  Can you call her to see if its more convenient for her to do in [place] or [place]?  Thanks! JA

## 2019-07-04 ENCOUNTER — Other Ambulatory Visit: Payer: Self-pay

## 2019-07-04 ENCOUNTER — Ambulatory Visit (INDEPENDENT_AMBULATORY_CARE_PROVIDER_SITE_OTHER): Payer: Medicare Other | Admitting: *Deleted

## 2019-07-04 DIAGNOSIS — Z5181 Encounter for therapeutic drug level monitoring: Secondary | ICD-10-CM

## 2019-07-04 DIAGNOSIS — Z79899 Other long term (current) drug therapy: Secondary | ICD-10-CM | POA: Diagnosis not present

## 2019-07-04 DIAGNOSIS — I480 Paroxysmal atrial fibrillation: Secondary | ICD-10-CM

## 2019-07-04 NOTE — Progress Notes (Signed)
Pt here for EKG - printed lab orders and pt will take them to [facility] - EKG done and wioll forward to provider - pt denies any complaints or symptoms at this time

## 2019-07-06 DIAGNOSIS — Z79899 Other long term (current) drug therapy: Secondary | ICD-10-CM | POA: Diagnosis not present

## 2019-07-06 DIAGNOSIS — I10 Essential (primary) hypertension: Secondary | ICD-10-CM | POA: Diagnosis not present

## 2019-07-06 DIAGNOSIS — Z5181 Encounter for therapeutic drug level monitoring: Secondary | ICD-10-CM | POA: Diagnosis not present

## 2019-07-12 ENCOUNTER — Telehealth: Payer: Self-pay | Admitting: Emergency Medicine

## 2019-07-12 ENCOUNTER — Telehealth: Payer: Self-pay | Admitting: Internal Medicine

## 2019-07-12 NOTE — Telephone Encounter (Signed)
-----   Message from Satira Sark, MD sent at 07/12/2019 12:19 PM EDT ----- Regarding: RE: clearance Yes, reasonable to hold Xarelto as requested for colonoscopy. ----- Message ----- From: Luanne Bras, CMA Sent: 07/12/2019  11:56 AM EDT To: Satira Sark, MD Subject: clearance                                      One of our provider anna boone would like clearance to hold xerelto x48 prior to tcs with propofol

## 2019-07-12 NOTE — Telephone Encounter (Signed)
Request sent to doctor [facility] today requesting clearance

## 2019-07-12 NOTE — Telephone Encounter (Signed)
Tried to call pt, no answer, LMOAM to inform her received ok to hold Xarelto and we will call her when [facility] August schedule is received to schedule her TCS.

## 2019-07-12 NOTE — Telephone Encounter (Signed)
Magda Paganini, per encounter form please verify with Dr. Domenic Polite if pt can hold Xarelto for 48 hours prior to procedure.

## 2019-07-12 NOTE — Telephone Encounter (Signed)
Pt said she saw AB on 5/13 and was told we were going to set her up a colonoscopy with RMR. She was following up because she hasn't heard from [place]. Please advise. [phone]

## 2019-07-12 NOTE — Telephone Encounter (Signed)
Cardiac clearance received.

## 2019-07-12 NOTE — Telephone Encounter (Signed)
Request Clearance was sent to doctor Domenic Polite  On 5/26. Waiting on response

## 2019-07-17 DIAGNOSIS — E1165 Type 2 diabetes mellitus with hyperglycemia: Secondary | ICD-10-CM | POA: Diagnosis not present

## 2019-07-17 DIAGNOSIS — I10 Essential (primary) hypertension: Secondary | ICD-10-CM | POA: Diagnosis not present

## 2019-07-17 DIAGNOSIS — I480 Paroxysmal atrial fibrillation: Secondary | ICD-10-CM | POA: Diagnosis not present

## 2019-07-17 DIAGNOSIS — Z7984 Long term (current) use of oral hypoglycemic drugs: Secondary | ICD-10-CM | POA: Diagnosis not present

## 2019-07-18 ENCOUNTER — Other Ambulatory Visit: Payer: Self-pay | Admitting: Cardiology

## 2019-07-24 NOTE — Telephone Encounter (Signed)
noted 

## 2019-07-24 NOTE — Telephone Encounter (Signed)
Pt called in to f/u on having procedure.  Informed her that Tretha Sciara called her to inform her that we received clearance to hold Xarelto 48 hours prior to TCS.  She was informed that we would be in contact with her once August procedure schedules are available.  Pt voiced understanding.

## 2019-08-16 ENCOUNTER — Telehealth: Payer: Self-pay | Admitting: *Deleted

## 2019-08-16 ENCOUNTER — Telehealth: Payer: Self-pay | Admitting: Internal Medicine

## 2019-08-16 DIAGNOSIS — I10 Essential (primary) hypertension: Secondary | ICD-10-CM | POA: Diagnosis not present

## 2019-08-16 DIAGNOSIS — Z7984 Long term (current) use of oral hypoglycemic drugs: Secondary | ICD-10-CM | POA: Diagnosis not present

## 2019-08-16 DIAGNOSIS — E1165 Type 2 diabetes mellitus with hyperglycemia: Secondary | ICD-10-CM | POA: Diagnosis not present

## 2019-08-16 DIAGNOSIS — I480 Paroxysmal atrial fibrillation: Secondary | ICD-10-CM | POA: Diagnosis not present

## 2019-08-16 MED ORDER — NA SULFATE-K SULFATE-MG SULF 17.5-3.13-1.6 GM/177ML PO SOLN: 1.0000 | Freq: Once | ORAL | 0 refills | Status: AC

## 2019-08-16 NOTE — Telephone Encounter (Signed)
Pt was returning call to [place]. I told her ME was scheduling another patient and would have to call her back. [phone]

## 2019-08-16 NOTE — Telephone Encounter (Signed)
LMOVM to schedule TCS with propofol with RMR

## 2019-08-16 NOTE — Addendum Note (Signed)
Addended by: Cheron Every on: 08/16/2019 04:33 PM   Modules accepted: Orders

## 2019-08-16 NOTE — Telephone Encounter (Signed)
See prior note

## 2019-08-16 NOTE — Telephone Encounter (Signed)
Called pt. She is scheduled for 8/2 at 1:00pm. Pt aware will need pre-op/covid test as well. Will mail this apt with her instructions. Confirmed mailing address. Confirmed pharmacy. Rx for prep sent in. Orders entered.

## 2019-08-18 ENCOUNTER — Encounter: Payer: Self-pay | Admitting: *Deleted

## 2019-08-25 ENCOUNTER — Telehealth: Payer: Self-pay | Admitting: Internal Medicine

## 2019-08-25 NOTE — Telephone Encounter (Signed)
Routing to AB for advice. 

## 2019-08-25 NOTE — Telephone Encounter (Signed)
PATIENT CALLED AND SAID THAT SHE IS A DIABETIC AND WANTS TO KNOW IF HER MEDS NEED TO BE ADJUSTED FOR HER PROCEDURE

## 2019-08-25 NOTE — Telephone Encounter (Signed)
No oral diabetes medications day of procedure.

## 2019-08-28 ENCOUNTER — Encounter: Payer: Self-pay | Admitting: Gastroenterology

## 2019-08-28 NOTE — Telephone Encounter (Signed)
Tried to call pt, no answer, left detailed message on answering machine to inform pt not to take any diabetic medications the day of procedure.

## 2019-09-11 ENCOUNTER — Telehealth: Payer: Self-pay | Admitting: Emergency Medicine

## 2019-09-11 NOTE — Telephone Encounter (Signed)
Called pt. She did not want to r/s her procedure at this time. I called endo and advised to cancel. FYI to AB

## 2019-09-11 NOTE — Telephone Encounter (Signed)
Pt called to  cancel her procedure for colonoscopy with rr on 8/2.

## 2019-09-13 ENCOUNTER — Other Ambulatory Visit: Payer: Self-pay | Admitting: Cardiology

## 2019-09-14 ENCOUNTER — Other Ambulatory Visit (HOSPITAL_COMMUNITY): Payer: Medicare Other

## 2019-09-14 ENCOUNTER — Encounter (HOSPITAL_COMMUNITY): Admission: RE | Admit: 2019-09-14 | Payer: Medicare Other | Source: Ambulatory Visit

## 2019-09-18 ENCOUNTER — Encounter (HOSPITAL_COMMUNITY): Payer: Self-pay

## 2019-09-18 ENCOUNTER — Ambulatory Visit (HOSPITAL_COMMUNITY): Admit: 2019-09-18 | Payer: Medicare Other | Admitting: Internal Medicine

## 2019-09-18 SURGERY — COLONOSCOPY WITH PROPOFOL
Anesthesia: Monitor Anesthesia Care

## 2019-09-20 DIAGNOSIS — E782 Mixed hyperlipidemia: Secondary | ICD-10-CM | POA: Diagnosis not present

## 2019-09-20 DIAGNOSIS — M545 Low back pain: Secondary | ICD-10-CM | POA: Diagnosis not present

## 2019-09-20 DIAGNOSIS — Z0000 Encounter for general adult medical examination without abnormal findings: Secondary | ICD-10-CM | POA: Diagnosis not present

## 2019-09-20 DIAGNOSIS — E1165 Type 2 diabetes mellitus with hyperglycemia: Secondary | ICD-10-CM | POA: Diagnosis not present

## 2019-09-20 DIAGNOSIS — I10 Essential (primary) hypertension: Secondary | ICD-10-CM | POA: Diagnosis not present

## 2019-09-25 DIAGNOSIS — E538 Deficiency of other specified B group vitamins: Secondary | ICD-10-CM | POA: Diagnosis not present

## 2019-09-26 ENCOUNTER — Ambulatory Visit (INDEPENDENT_AMBULATORY_CARE_PROVIDER_SITE_OTHER): Payer: Medicare Other | Admitting: *Deleted

## 2019-09-26 DIAGNOSIS — I495 Sick sinus syndrome: Secondary | ICD-10-CM | POA: Diagnosis not present

## 2019-09-26 LAB — CUP PACEART REMOTE DEVICE CHECK
Battery Impedance: 2058 Ohm
Battery Remaining Longevity: 31 mo
Battery Voltage: 2.75 V
Brady Statistic AP VP Percent: 0 %
Brady Statistic AP VS Percent: 61 %
Brady Statistic AS VP Percent: 0 %
Brady Statistic AS VS Percent: 39 %
Date Time Interrogation Session: 20210810113027
Implantable Lead Implant Date: 20110510
Implantable Lead Implant Date: 20110510
Implantable Lead Location: 753859
Implantable Lead Location: 753860
Implantable Lead Model: 5076
Implantable Lead Model: 5092
Implantable Pulse Generator Implant Date: 20110510
Lead Channel Impedance Value: 467 Ohm
Lead Channel Impedance Value: 537 Ohm
Lead Channel Pacing Threshold Amplitude: 0.5 V
Lead Channel Pacing Threshold Amplitude: 1 V
Lead Channel Pacing Threshold Pulse Width: 0.4 ms
Lead Channel Pacing Threshold Pulse Width: 0.4 ms
Lead Channel Setting Pacing Amplitude: 2 V
Lead Channel Setting Pacing Amplitude: 2.5 V
Lead Channel Setting Pacing Pulse Width: 0.4 ms
Lead Channel Setting Sensing Sensitivity: 2.8 mV

## 2019-09-28 NOTE — Progress Notes (Signed)
Remote pacemaker transmission.   

## 2019-10-08 NOTE — Progress Notes (Signed)
Cardiology Office Note  Date: 10/09/2019   ID: [name] [name], DOB 05/01/1945, MRN [id number]  PCP:  Curlene Labrum, MD  Cardiologist:  Rozann Lesches, MD Electrophysiologist:  None   Chief Complaint  Patient presents with  . Cardiac follow-up    History of Present Illness: [name] [name] is a 74 y.o. female last seen in October 2020.  She presents for a follow-up visit.  She does not report any palpitations or chest pain with activities.  No dizziness or syncope.  She follows with [name], Medtronic pacemaker in place.  Recent device interrogation in August showed normal function with 4.7% AF burden.  I reviewed her recent lab work from May as outlined below.  We also went over her medications.  She does not describe any bleeding problems on Xarelto.  Today's blood pressure was elevated, I asked her to check it more regularly at home and report back in case further medication adjustments are necessary.  Past Medical History:  Diagnosis Date  . Atrial fibrillation ([place])   . Atrial flutter ([place])   . Coronary atherosclerosis of native coronary artery    BMS to circumflex 9/08  . HTN (hypertension)   . Hyperlipidemia   . Multiple thyroid nodules   . 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 Type 2 diabetes mellitus ([place])   . Urticaria     Past Surgical History:  Procedure Laterality Date  . BREAST LUMPECTOMY    . COLONOSCOPY  2014    Last colonoscopy 2014 by Dr. Britta Mccreedy. Internal hemorrhoids, sigmoid diverticulosis, multiple sessile polyps, tubular adenomas.   Marland Kitchen PACEMAKER PLACEMENT     Medtronic  . ROTATOR CUFF REPAIR    . VENTRAL HERNIA REPAIR    . VESICOVAGINAL FISTULA CLOSURE W/ TAH      Current Outpatient Medications  Medication Sig Dispense Refill  . APPLE CIDER VINEGAR PO Take 450 mg by mouth daily.     . Cholecalciferol (D-3-5) 125 MCG (5000 UT) capsule Take 5,000 Units by mouth daily.    Marland Kitchen diltiazem (DILACOR XR) 120 MG 24 hr capsule Take 240 mg  by mouth daily. In the morning    . diltiazem (DILACOR XR) 180 MG 24 hr capsule Take 180 mg by mouth at bedtime.    . dofetilide (TIKOSYN) 500 MCG capsule Take 1 capsule (500 mcg total) by mouth 2 (two) times daily. 180 capsule 3  . glipiZIDE (GLUCOTROL) 10 MG tablet Take 10 mg by mouth 2 (two) times daily before a meal.    . nitroGLYCERIN (NITROSTAT) 0.4 MG SL tablet Place 1 tablet (0.4 mg total) under the tongue every 5 (five) minutes x 3 doses as needed for chest pain (if no relief after 3 rd dose, proceed to ED for an evaluation). 75 tablet 3  . potassium chloride SA (K-DUR) 20 MEQ tablet TAKE 1 TABLET BY MOUTH  EVERY OTHER DAY (Patient taking differently: Take 20 mEq by mouth every other day. In the morning.) 45 tablet 3  . pravastatin (PRAVACHOL) 40 MG tablet TAKE 1 TABLET BY MOUTH IN  THE EVENING (Patient taking differently: Take 40 mg by mouth every evening. ) 90 tablet 3  . rivaroxaban (XARELTO) 20 MG TABS tablet Take 1 tablet (20 mg total) by mouth every evening. 90 tablet 3  . RYBELSUS 14 MG TABS Take 14 mg by mouth daily.    Manus Gunning BOWEL PREP KIT 17.5-3.13-1.6 GM/177ML SOLN Take 354 mLs by mouth once.  No current facility-administered medications for this visit.   Allergies:  Codeine, Doxycycline, Lisinopril, and Metformin and related   ROS:   No dizziness or syncope.  Physical Exam: VS:  BP (!) 160/62   Pulse 68   Ht 5' 3"  (1.6 m)   Wt 179 lb (81.2 kg)   SpO2 96%   BMI 31.71 kg/m , BMI Body mass index is 31.71 kg/m.  Wt Readings from Last 3 Encounters:  10/09/19 179 lb (81.2 kg)  06/29/19 179 lb (81.2 kg)  06/26/19 177 lb (80.3 kg)    General: Patient appears comfortable at rest. HEENT: Conjunctiva and lids normal, wearing a mask. Neck: Supple, no elevated JVP or carotid bruits, no thyromegaly. Lungs: Clear to auscultation, nonlabored breathing at rest. Cardiac: Regular rate and rhythm, no S3 or significant systolic murmur. Extremities: No pitting edema, distal  pulses 2+.  ECG:  An ECG dated 07/04/2019 was personally reviewed today and demonstrated:  Ventricular pacing with probable underlying atrial fibrillation, nonspecific ST-T changes.  Recent Labwork:  May 2021: BUN 19, creatinine 0.73, potassium 4.5, hemoglobin 16.7, platelets 166, hemoglobin A1c 7.3%, cholesterol 140, triglycerides 98, HDL 48, LDL 73  Other Studies Reviewed Today:  Echocardiogram 09/17/2017: Study Conclusions  - Left ventricle: The cavity size was normal. Wall thickness was increased in a pattern of moderate LVH. Systolic function was normal. The estimated ejection fraction was in the range of 60% to 65%. Wall motion was normal; there were no regional wall motion abnormalities. The study is not technically sufficient to allow evaluation of LV diastolic function. - Aortic valve: Valve area (VTI): 3.43 cm^2. Valve area (Vmax): 3.06 cm^2.  Assessment and Plan:  1.  Paroxysmal atrial fibrillation with CHA2DS2-VASc score of 6.  She remains asymptomatic in terms of palpitations and has relatively low rhythm burden based on device interrogations.  Continue Cardizem CD along with Xarelto.  Follow-up about lab work in May is outlined above.  2.  Essential hypertension, patient will check blood pressure daily for the next few weeks and report back.  3.  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.  4.  CAD status post BMS to the circumflex.  She does not report any angina symptoms at this time.  No aspirin given use of Xarelto.  Continue Pravachol.  Medication Adjustments/Labs and Tests Ordered: Current medicines are reviewed at length with the patient today.  Concerns regarding medicines are outlined above.   Tests Ordered: No orders of the defined types were placed in this encounter.   Medication Changes: No orders of the defined types were placed in this encounter.   Disposition:  Follow up 6  months in the [place] office.  Signed, Satira Sark, MD, [facility] 10/09/2019 9:12 AM    [place] [facility] [place], [place], [place] [address] Phone: [phone]; Fax: [phone]

## 2019-10-09 ENCOUNTER — Ambulatory Visit (INDEPENDENT_AMBULATORY_CARE_PROVIDER_SITE_OTHER): Payer: Medicare Other | Admitting: Cardiology

## 2019-10-09 ENCOUNTER — Other Ambulatory Visit: Payer: Self-pay

## 2019-10-09 ENCOUNTER — Encounter: Payer: Self-pay | Admitting: Cardiology

## 2019-10-09 VITALS — BP 160/62 | HR 68 | Ht 63.0 in | Wt 179.0 lb

## 2019-10-09 DIAGNOSIS — E782 Mixed hyperlipidemia: Secondary | ICD-10-CM | POA: Diagnosis not present

## 2019-10-09 DIAGNOSIS — I495 Sick sinus syndrome: Secondary | ICD-10-CM | POA: Diagnosis not present

## 2019-10-09 DIAGNOSIS — I25119 Atherosclerotic heart disease of native coronary artery with unspecified angina pectoris: Secondary | ICD-10-CM

## 2019-10-09 DIAGNOSIS — I480 Paroxysmal atrial fibrillation: Secondary | ICD-10-CM

## 2019-10-09 NOTE — Patient Instructions (Addendum)
Medication Instructions:   Your physician recommends that you continue on your current medications as directed. Please refer to the Current Medication list given to you today.  Labwork:  NONE  Testing/Procedures:  NONE  Follow-Up:  Your physician recommends that you schedule a follow-up appointment in: 6 months.  Any Other Special Instructions Will Be Listed Below (If Applicable).  Please monitor and record your home blood pressures each day. Please call our office if your home blood pressure readings are elevated.  If you need a refill on your cardiac medications before your next appointment, please call your pharmacy.

## 2019-10-13 ENCOUNTER — Ambulatory Visit: Payer: Medicare Other | Admitting: Cardiology

## 2019-10-17 DIAGNOSIS — E1165 Type 2 diabetes mellitus with hyperglycemia: Secondary | ICD-10-CM | POA: Diagnosis not present

## 2019-10-17 DIAGNOSIS — Z7984 Long term (current) use of oral hypoglycemic drugs: Secondary | ICD-10-CM | POA: Diagnosis not present

## 2019-10-17 DIAGNOSIS — I480 Paroxysmal atrial fibrillation: Secondary | ICD-10-CM | POA: Diagnosis not present

## 2019-10-17 DIAGNOSIS — I10 Essential (primary) hypertension: Secondary | ICD-10-CM | POA: Diagnosis not present

## 2019-10-19 ENCOUNTER — Telehealth: Payer: Self-pay | Admitting: *Deleted

## 2019-10-19 ENCOUNTER — Telehealth: Payer: Self-pay | Admitting: Cardiology

## 2019-10-19 DIAGNOSIS — I480 Paroxysmal atrial fibrillation: Secondary | ICD-10-CM

## 2019-10-19 MED ORDER — LOSARTAN POTASSIUM 25 MG PO TABS
25.0000 mg | ORAL_TABLET | Freq: Every day | ORAL | 0 refills | Status: DC
Start: 2019-10-19 — End: 2019-10-24

## 2019-10-19 NOTE — Telephone Encounter (Signed)
New message    Patient returning call to [facility]

## 2019-10-19 NOTE — Telephone Encounter (Signed)
Pt dropped off BP readings   8/25 123/107 and 143/90 8/27 123/107 and 139/69 8/30 152/81 and 128/82 8/31 161/91 and 139/91

## 2019-10-19 NOTE — Telephone Encounter (Signed)
Pt voiced understanding - will come by office to pick up lab orders - losartan send to [facility]

## 2019-10-19 NOTE — Telephone Encounter (Signed)
New message    Ms Zanella needs a call back regarding the instructions she was given about her medication

## 2019-10-19 NOTE — Telephone Encounter (Signed)
Called to get medication instructions again r/e losartan  I would suggest that we start losartan 25 mg daily.  Chart lists lisinopril as an allergy but looks to be related to intolerance from cough, would clarify this with her.  Check BMET in 7 to 10 days from starting losartan.  Patient informed and verbalized understanding of plan.

## 2019-10-19 NOTE — Addendum Note (Signed)
Addended by: Julian Hy T on: 10/19/2019 02:23 PM   Modules accepted: Orders

## 2019-10-19 NOTE — Telephone Encounter (Signed)
Noted, thank you.  I would suggest that we start losartan 25 mg daily.  Chart lists lisinopril as an allergy but looks to be related to intolerance from cough, would clarify this with her.  Check BMET in 7 to 10 days from starting losartan.

## 2019-10-19 NOTE — Telephone Encounter (Signed)
Pt verified that lisinopril did cause pt cough was only side affect

## 2019-10-24 ENCOUNTER — Other Ambulatory Visit: Payer: Self-pay | Admitting: Cardiology

## 2019-10-24 MED ORDER — LOSARTAN POTASSIUM 25 MG PO TABS: 25.0000 mg | ORAL_TABLET | Freq: Every day | ORAL | 3 refills | Status: DC

## 2019-10-26 DIAGNOSIS — E538 Deficiency of other specified B group vitamins: Secondary | ICD-10-CM | POA: Diagnosis not present

## 2019-10-31 ENCOUNTER — Telehealth: Payer: Self-pay | Admitting: Internal Medicine

## 2019-10-31 DIAGNOSIS — I480 Paroxysmal atrial fibrillation: Secondary | ICD-10-CM | POA: Diagnosis not present

## 2019-10-31 NOTE — Telephone Encounter (Signed)
Patient called stating that for the past week she is having pains in the area of her pacemaker.

## 2019-10-31 NOTE — Telephone Encounter (Signed)
Attempted to return phne call, no answer, LVM on identified VM with device clinic phone number and hours to return call if assistance is still needed.

## 2019-11-02 ENCOUNTER — Telehealth: Payer: Self-pay

## 2019-11-02 ENCOUNTER — Other Ambulatory Visit: Payer: Self-pay | Admitting: *Deleted

## 2019-11-02 MED ORDER — DILTIAZEM HCL ER 180 MG PO CP24: 180.0000 mg | ORAL_CAPSULE | Freq: Every day | ORAL | 3 refills | Status: DC

## 2019-11-02 NOTE — Telephone Encounter (Signed)
Patient called back and states when she lifts her arm and when she lies down at night its pain sometimes and she is just a little concerned. Patient was asked if she can send a transmission and she states that she has bad service and she can try a little later on today

## 2019-11-02 NOTE — Telephone Encounter (Signed)
Spoke with pt.  She reports soreness lasting 2-3 min at [facility] site every other day, notices discomfort specifically with raising left arm or lying on left side.  Rarely occurs when lying on right side.  Pt reports she is concerned because discomfort is occurring more frequently than it used to.  Nothing seems to make it better or worse.  Reports she has full ROM with her arm and shoulder, denies any discomfort at this time with arm/shoulder rotation.  Denies signs/symptoms of infection, including drainage, redness, swelling, tenderness, fever, or chills.   Pt plans to send a PPM transmission for review later today or tomorrow.  Advised will let her know when transmission is received and forward message to [name] for recommendations at that time.  Pt in agreement with plan.

## 2019-11-02 NOTE — Telephone Encounter (Signed)
Called patient and LVM for her to call back about her having pain at her pacemaker site. Left DC direct #

## 2019-11-03 NOTE — Telephone Encounter (Signed)
See duplicate phone note opened 11/02/19.

## 2019-11-03 NOTE — Telephone Encounter (Signed)
Transmission received.  Normal PPM function. 4.6% AF burden since 02/2018, on Xarelto.  No HVR episodes.  Lead trends stable.  Advised pt that transmission was received.  Advised will call back with any recommendations from [name] regarding intermittent discomfort at [facility] site.  Pt in agreement with plan.

## 2019-11-08 DIAGNOSIS — W19XXXA Unspecified fall, initial encounter: Secondary | ICD-10-CM | POA: Diagnosis not present

## 2019-11-08 DIAGNOSIS — R0781 Pleurodynia: Secondary | ICD-10-CM | POA: Diagnosis not present

## 2019-11-09 DIAGNOSIS — M47814 Spondylosis without myelopathy or radiculopathy, thoracic region: Secondary | ICD-10-CM | POA: Diagnosis not present

## 2019-11-09 DIAGNOSIS — S3992XA Unspecified injury of lower back, initial encounter: Secondary | ICD-10-CM | POA: Diagnosis not present

## 2019-11-09 DIAGNOSIS — I700 Atherosclerosis of aorta: Secondary | ICD-10-CM | POA: Diagnosis not present

## 2019-11-09 DIAGNOSIS — M47816 Spondylosis without myelopathy or radiculopathy, lumbar region: Secondary | ICD-10-CM | POA: Diagnosis not present

## 2019-11-09 DIAGNOSIS — R0781 Pleurodynia: Secondary | ICD-10-CM | POA: Diagnosis not present

## 2019-11-09 DIAGNOSIS — S299XXA Unspecified injury of thorax, initial encounter: Secondary | ICD-10-CM | POA: Diagnosis not present

## 2019-11-09 DIAGNOSIS — M5136 Other intervertebral disc degeneration, lumbar region: Secondary | ICD-10-CM | POA: Diagnosis not present

## 2019-11-09 DIAGNOSIS — Z9181 History of falling: Secondary | ICD-10-CM | POA: Diagnosis not present

## 2019-11-10 NOTE — Telephone Encounter (Signed)
Discussed with [name].  Plan to offer appointment with [name] in [place] on 11/17/19.  Spoke with pt.  She continues to deny any signs/symptoms of infection.  Pt accepted appointment 11/17/19 at 11:30am.  She is aware to call back with any new concerns in the interim.

## 2019-11-16 DIAGNOSIS — Z7984 Long term (current) use of oral hypoglycemic drugs: Secondary | ICD-10-CM | POA: Diagnosis not present

## 2019-11-16 DIAGNOSIS — I10 Essential (primary) hypertension: Secondary | ICD-10-CM | POA: Diagnosis not present

## 2019-11-16 DIAGNOSIS — I480 Paroxysmal atrial fibrillation: Secondary | ICD-10-CM | POA: Diagnosis not present

## 2019-11-16 DIAGNOSIS — E1165 Type 2 diabetes mellitus with hyperglycemia: Secondary | ICD-10-CM | POA: Diagnosis not present

## 2019-11-17 ENCOUNTER — Ambulatory Visit (INDEPENDENT_AMBULATORY_CARE_PROVIDER_SITE_OTHER): Payer: Medicare Other | Admitting: Internal Medicine

## 2019-11-17 ENCOUNTER — Encounter: Payer: Self-pay | Admitting: Internal Medicine

## 2019-11-17 VITALS — BP 150/82 | HR 86 | Ht 63.0 in | Wt 183.2 lb

## 2019-11-17 DIAGNOSIS — D6869 Other thrombophilia: Secondary | ICD-10-CM

## 2019-11-17 DIAGNOSIS — I495 Sick sinus syndrome: Secondary | ICD-10-CM

## 2019-11-17 DIAGNOSIS — I480 Paroxysmal atrial fibrillation: Secondary | ICD-10-CM

## 2019-11-17 DIAGNOSIS — I10 Essential (primary) hypertension: Secondary | ICD-10-CM

## 2019-11-17 NOTE — Patient Instructions (Signed)
Medication Instructions:  Continue all current medications.  Labwork: none  Testing/Procedures: none  Follow-Up: 1 year   Any Other Special Instructions Will Be Listed Below (If Applicable).  If you need a refill on your cardiac medications before your next appointment, please call your pharmacy.  

## 2019-11-17 NOTE — Progress Notes (Signed)
PCP: Curlene Labrum, MD Primary Cardiologist: Dr Domenic Polite Primary EP:  Dr Rayann Heman  [name] [name] is a 74 y.o. female who presents today for routine electrophysiology followup.  Since last being seen in our clinic, the patient reports doing very well.  She feel several weeks ago and hit her R lower back.  This is healing slowly.  She has had some pain over her PPM and is here today to look at this.  No redness/fevers/chills/ drainage.  Today, she denies symptoms of palpitations, chest pain, shortness of breath,  lower extremity edema, dizziness, presyncope, or syncope.  The patient is otherwise without complaint today.   Past Medical History:  Diagnosis Date  . Atrial fibrillation ([place])   . Atrial flutter ([place])   . Coronary atherosclerosis of native coronary artery    BMS to circumflex 9/08  . HTN (hypertension)   . Hyperlipidemia   . Multiple thyroid nodules   . 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 Type 2 diabetes mellitus ([place])   . Urticaria    Past Surgical History:  Procedure Laterality Date  . BREAST LUMPECTOMY    . COLONOSCOPY  2014    Last colonoscopy 2014 by Dr. Britta Mccreedy. Internal hemorrhoids, sigmoid diverticulosis, multiple sessile polyps, tubular adenomas.   Marland Kitchen PACEMAKER PLACEMENT     Medtronic  . ROTATOR CUFF REPAIR    . VENTRAL HERNIA REPAIR    . VESICOVAGINAL FISTULA CLOSURE W/ TAH      ROS- all systems are reviewed and negative except as per HPI above  Current Outpatient Medications  Medication Sig Dispense Refill  . APPLE CIDER VINEGAR PO Take 450 mg by mouth daily.     . Cholecalciferol (D-3-5) 125 MCG (5000 UT) capsule Take 5,000 Units by mouth daily.    Marland Kitchen diltiazem (DILACOR XR) 120 MG 24 hr capsule Take 240 mg by mouth daily. In the morning    . diltiazem (DILACOR XR) 180 MG 24 hr capsule Take 1 capsule (180 mg total) by mouth at bedtime. 90 capsule 3  . dofetilide (TIKOSYN) 500 MCG capsule Take 1 capsule (500 mcg total) by mouth 2 (two)  times daily. 180 capsule 3  . glipiZIDE (GLUCOTROL) 10 MG tablet Take 10 mg by mouth 2 (two) times daily before a meal.    . losartan (COZAAR) 25 MG tablet Take 1 tablet (25 mg total) by mouth daily. 90 tablet 3  . nitroGLYCERIN (NITROSTAT) 0.4 MG SL tablet Place 1 tablet (0.4 mg total) under the tongue every 5 (five) minutes x 3 doses as needed for chest pain (if no relief after 3 rd dose, proceed to ED for an evaluation). 75 tablet 3  . potassium chloride SA (KLOR-CON) 20 MEQ tablet TAKE 1 TABLET BY MOUTH  EVERY OTHER DAY 45 tablet 3  . pravastatin (PRAVACHOL) 40 MG tablet TAKE 1 TABLET BY MOUTH IN  THE EVENING (Patient taking differently: Take 40 mg by mouth every evening. ) 90 tablet 3  . rivaroxaban (XARELTO) 20 MG TABS tablet Take 1 tablet (20 mg total) by mouth every evening. 90 tablet 3  . RYBELSUS 14 MG TABS Take 14 mg by mouth daily.    Manus Gunning BOWEL PREP KIT 17.5-3.13-1.6 GM/177ML SOLN Take 354 mLs by mouth once.     No current facility-administered medications for this visit.    Physical Exam: Vitals:   11/17/19 1159  BP: (!) 150/82  Pulse: 86  SpO2: 94%  Weight: 183 lb  3.2 oz (83.1 kg)  Height: _0  (1.6 m)    GEN- The patient is well appearing, alert and oriented x 3 today.   Head- normocephalic, atraumatic Eyes-  Sclera clear, conjunctiva pink Ears- hearing intact Oropharynx- clear Lungs- C normal work of breathing Chest- pacemaker pocket is well healed, normal in appearance, no infectious signs Heart- Regular rate and rhythm  GI- soft  Extremities- no clubbing, cyanosis, or edema  Pacemaker interrogation- reviewed in detail today,  See PACEART report  ekg tracing ordered 5/21is personally reviewed and shows afib with stable qt  Assessment and Plan:  1. Symptomatic sinus bradycardia  Normal pacemaker function See Pace Art report No changes today she is not device dependant today  2. Paroxysmal atrial fibrillation Well controlled with tikosyn Labs 9/21  reviewed ekg 5/21 reviewed She will have repeat ekg with Dr Domenic Polite in November.  The importance of follow-up with Dr Domenic Polite twice per year to avoid toxicity with tikosyn were discussed  On xarelto for stroke prevention chads2vasc score is 6  3. CAD No ischemic symptoms  4. PPM pocket Well healed Normal in appearance Patient reassured today  5. HTN Stable No change required today  Return to see me in a year  Risks, benefits and potential toxicities for medications prescribed and/or refilled reviewed with patient today.   Thompson Grayer MD, [facility] 11/17/2019 12:20 PM

## 2019-11-27 DIAGNOSIS — E538 Deficiency of other specified B group vitamins: Secondary | ICD-10-CM | POA: Diagnosis not present

## 2019-12-14 ENCOUNTER — Other Ambulatory Visit: Payer: Self-pay | Admitting: Internal Medicine

## 2019-12-16 DIAGNOSIS — E1165 Type 2 diabetes mellitus with hyperglycemia: Secondary | ICD-10-CM | POA: Diagnosis not present

## 2019-12-16 DIAGNOSIS — Z7984 Long term (current) use of oral hypoglycemic drugs: Secondary | ICD-10-CM | POA: Diagnosis not present

## 2019-12-16 DIAGNOSIS — I10 Essential (primary) hypertension: Secondary | ICD-10-CM | POA: Diagnosis not present

## 2019-12-25 DIAGNOSIS — E538 Deficiency of other specified B group vitamins: Secondary | ICD-10-CM | POA: Diagnosis not present

## 2019-12-26 ENCOUNTER — Ambulatory Visit (INDEPENDENT_AMBULATORY_CARE_PROVIDER_SITE_OTHER): Payer: Medicare Other

## 2019-12-26 DIAGNOSIS — I495 Sick sinus syndrome: Secondary | ICD-10-CM

## 2019-12-27 DIAGNOSIS — R609 Edema, unspecified: Secondary | ICD-10-CM | POA: Diagnosis not present

## 2019-12-27 DIAGNOSIS — Z23 Encounter for immunization: Secondary | ICD-10-CM | POA: Diagnosis not present

## 2019-12-28 LAB — CUP PACEART REMOTE DEVICE CHECK
Battery Impedance: 2211 Ohm
Battery Remaining Longevity: 29 mo
Battery Voltage: 2.75 V
Brady Statistic AP VP Percent: 0 %
Brady Statistic AP VS Percent: 59 %
Brady Statistic AS VP Percent: 0 %
Brady Statistic AS VS Percent: 40 %
Date Time Interrogation Session: 20211110135026
Implantable Lead Implant Date: 20110510
Implantable Lead Implant Date: 20110510
Implantable Lead Location: 753859
Implantable Lead Location: 753860
Implantable Lead Model: 5076
Implantable Lead Model: 5092
Implantable Pulse Generator Implant Date: 20110510
Lead Channel Impedance Value: 472 Ohm
Lead Channel Impedance Value: 533 Ohm
Lead Channel Pacing Threshold Amplitude: 0.5 V
Lead Channel Pacing Threshold Amplitude: 1.125 V
Lead Channel Pacing Threshold Pulse Width: 0.4 ms
Lead Channel Pacing Threshold Pulse Width: 0.4 ms
Lead Channel Setting Pacing Amplitude: 2 V
Lead Channel Setting Pacing Amplitude: 2.5 V
Lead Channel Setting Pacing Pulse Width: 0.4 ms
Lead Channel Setting Sensing Sensitivity: 2.8 mV

## 2019-12-29 NOTE — Progress Notes (Signed)
Remote pacemaker transmission.   

## 2020-01-01 ENCOUNTER — Other Ambulatory Visit: Payer: Self-pay | Admitting: *Deleted

## 2020-01-01 MED ORDER — DILTIAZEM HCL ER 120 MG PO CP24: 240.0000 mg | ORAL_CAPSULE | Freq: Every day | ORAL | 2 refills | Status: DC

## 2020-01-16 ENCOUNTER — Other Ambulatory Visit: Payer: Self-pay | Admitting: *Deleted

## 2020-01-16 DIAGNOSIS — I10 Essential (primary) hypertension: Secondary | ICD-10-CM | POA: Diagnosis not present

## 2020-01-16 DIAGNOSIS — Z7984 Long term (current) use of oral hypoglycemic drugs: Secondary | ICD-10-CM | POA: Diagnosis not present

## 2020-01-16 DIAGNOSIS — E1165 Type 2 diabetes mellitus with hyperglycemia: Secondary | ICD-10-CM | POA: Diagnosis not present

## 2020-01-16 MED ORDER — DILTIAZEM HCL ER 180 MG PO CP24: 180.0000 mg | ORAL_CAPSULE | Freq: Every day | ORAL | 3 refills | Status: DC

## 2020-01-19 ENCOUNTER — Telehealth: Payer: Self-pay | Admitting: Internal Medicine

## 2020-01-19 NOTE — Telephone Encounter (Signed)
°  1. Has your device fired? NO   2. Is you device beeping? NO   3. Are you experiencing draining or swelling at device site? NO   4. Are you calling to see if we received your device transmission?  5. Have you passed out?   Patient called stating that she is having a lot of pain in the pacemaker site and her left arm.  On scale of 1-10 her pain is 9.      Please route to [place]

## 2020-01-19 NOTE — Telephone Encounter (Signed)
Spoke with pt, this is not a new issue.  She was seen in October for the same issue.  She reports she had not had pain for a while, then suddenly last night any time she moves her Left shoulder she has 10/10 pain.  She says she can still move her shoulder but it hurts to do so.  She does not experience the pain at rest or without movement.  Pt denies any trauma to her shoulder.    Pt has not done anything to alleviate the pain.    Educated pt on use of tylenol as directed according to labeled dosages and shoulder execises including walking fingers up the wall.  Advised of emergency room precautions if pain is severe and inhibits movement or does not resolve with treatment.

## 2020-01-22 DIAGNOSIS — K5900 Constipation, unspecified: Secondary | ICD-10-CM | POA: Diagnosis not present

## 2020-01-22 DIAGNOSIS — M545 Low back pain, unspecified: Secondary | ICD-10-CM | POA: Diagnosis not present

## 2020-01-24 DIAGNOSIS — E538 Deficiency of other specified B group vitamins: Secondary | ICD-10-CM | POA: Diagnosis not present

## 2020-01-31 DIAGNOSIS — H26493 Other secondary cataract, bilateral: Secondary | ICD-10-CM | POA: Diagnosis not present

## 2020-01-31 DIAGNOSIS — Z961 Presence of intraocular lens: Secondary | ICD-10-CM | POA: Diagnosis not present

## 2020-01-31 DIAGNOSIS — H1045 Other chronic allergic conjunctivitis: Secondary | ICD-10-CM | POA: Diagnosis not present

## 2020-02-16 DIAGNOSIS — Z7984 Long term (current) use of oral hypoglycemic drugs: Secondary | ICD-10-CM | POA: Diagnosis not present

## 2020-02-16 DIAGNOSIS — E1165 Type 2 diabetes mellitus with hyperglycemia: Secondary | ICD-10-CM | POA: Diagnosis not present

## 2020-02-16 DIAGNOSIS — I10 Essential (primary) hypertension: Secondary | ICD-10-CM | POA: Diagnosis not present

## 2020-02-16 DIAGNOSIS — I480 Paroxysmal atrial fibrillation: Secondary | ICD-10-CM | POA: Diagnosis not present

## 2020-02-26 DIAGNOSIS — M47816 Spondylosis without myelopathy or radiculopathy, lumbar region: Secondary | ICD-10-CM | POA: Diagnosis not present

## 2020-02-26 DIAGNOSIS — M5136 Other intervertebral disc degeneration, lumbar region: Secondary | ICD-10-CM | POA: Diagnosis not present

## 2020-02-26 DIAGNOSIS — M545 Low back pain, unspecified: Secondary | ICD-10-CM | POA: Diagnosis not present

## 2020-03-11 DIAGNOSIS — E1165 Type 2 diabetes mellitus with hyperglycemia: Secondary | ICD-10-CM | POA: Diagnosis not present

## 2020-03-11 DIAGNOSIS — E7849 Other hyperlipidemia: Secondary | ICD-10-CM | POA: Diagnosis not present

## 2020-03-11 DIAGNOSIS — E042 Nontoxic multinodular goiter: Secondary | ICD-10-CM | POA: Diagnosis not present

## 2020-03-11 DIAGNOSIS — K649 Unspecified hemorrhoids: Secondary | ICD-10-CM | POA: Diagnosis not present

## 2020-03-11 DIAGNOSIS — I480 Paroxysmal atrial fibrillation: Secondary | ICD-10-CM | POA: Diagnosis not present

## 2020-03-11 DIAGNOSIS — I10 Essential (primary) hypertension: Secondary | ICD-10-CM | POA: Diagnosis not present

## 2020-03-11 DIAGNOSIS — R609 Edema, unspecified: Secondary | ICD-10-CM | POA: Diagnosis not present

## 2020-03-16 DIAGNOSIS — I10 Essential (primary) hypertension: Secondary | ICD-10-CM | POA: Diagnosis not present

## 2020-03-16 DIAGNOSIS — I480 Paroxysmal atrial fibrillation: Secondary | ICD-10-CM | POA: Diagnosis not present

## 2020-03-16 DIAGNOSIS — Z7984 Long term (current) use of oral hypoglycemic drugs: Secondary | ICD-10-CM | POA: Diagnosis not present

## 2020-03-16 DIAGNOSIS — E1165 Type 2 diabetes mellitus with hyperglycemia: Secondary | ICD-10-CM | POA: Diagnosis not present

## 2020-03-26 ENCOUNTER — Ambulatory Visit (INDEPENDENT_AMBULATORY_CARE_PROVIDER_SITE_OTHER): Payer: Medicare Other

## 2020-03-26 DIAGNOSIS — I495 Sick sinus syndrome: Secondary | ICD-10-CM

## 2020-03-27 DIAGNOSIS — Z961 Presence of intraocular lens: Secondary | ICD-10-CM | POA: Diagnosis not present

## 2020-03-27 DIAGNOSIS — Z7984 Long term (current) use of oral hypoglycemic drugs: Secondary | ICD-10-CM | POA: Diagnosis not present

## 2020-03-27 DIAGNOSIS — Z794 Long term (current) use of insulin: Secondary | ICD-10-CM | POA: Diagnosis not present

## 2020-03-27 DIAGNOSIS — E119 Type 2 diabetes mellitus without complications: Secondary | ICD-10-CM | POA: Diagnosis not present

## 2020-03-27 DIAGNOSIS — H524 Presbyopia: Secondary | ICD-10-CM | POA: Diagnosis not present

## 2020-03-27 DIAGNOSIS — H16223 Keratoconjunctivitis sicca, not specified as Sjogren's, bilateral: Secondary | ICD-10-CM | POA: Diagnosis not present

## 2020-03-27 DIAGNOSIS — H02204 Unspecified lagophthalmos left upper eyelid: Secondary | ICD-10-CM | POA: Diagnosis not present

## 2020-03-27 DIAGNOSIS — H02201 Unspecified lagophthalmos right upper eyelid: Secondary | ICD-10-CM | POA: Diagnosis not present

## 2020-03-28 LAB — CUP PACEART REMOTE DEVICE CHECK
Battery Impedance: 2373 Ohm
Battery Remaining Longevity: 28 mo
Battery Voltage: 2.74 V
Brady Statistic AP VP Percent: 0 %
Brady Statistic AP VS Percent: 57 %
Brady Statistic AS VP Percent: 0 %
Brady Statistic AS VS Percent: 42 %
Date Time Interrogation Session: 20220209143155
Implantable Lead Implant Date: 20110510
Implantable Lead Implant Date: 20110510
Implantable Lead Location: 753859
Implantable Lead Location: 753860
Implantable Lead Model: 5076
Implantable Lead Model: 5092
Implantable Pulse Generator Implant Date: 20110510
Lead Channel Impedance Value: 484 Ohm
Lead Channel Impedance Value: 552 Ohm
Lead Channel Pacing Threshold Amplitude: 0.5 V
Lead Channel Pacing Threshold Amplitude: 1.125 V
Lead Channel Pacing Threshold Pulse Width: 0.4 ms
Lead Channel Pacing Threshold Pulse Width: 0.4 ms
Lead Channel Setting Pacing Amplitude: 2 V
Lead Channel Setting Pacing Amplitude: 2.5 V
Lead Channel Setting Pacing Pulse Width: 0.4 ms
Lead Channel Setting Sensing Sensitivity: 2.8 mV

## 2020-04-01 NOTE — Progress Notes (Signed)
Remote pacemaker transmission.   

## 2020-04-02 ENCOUNTER — Encounter: Payer: Self-pay | Admitting: Cardiology

## 2020-04-02 ENCOUNTER — Ambulatory Visit (INDEPENDENT_AMBULATORY_CARE_PROVIDER_SITE_OTHER): Payer: Medicare Other | Admitting: Cardiology

## 2020-04-02 VITALS — BP 132/68 | HR 83 | Ht 63.0 in | Wt 179.0 lb

## 2020-04-02 DIAGNOSIS — I480 Paroxysmal atrial fibrillation: Secondary | ICD-10-CM

## 2020-04-02 DIAGNOSIS — I25119 Atherosclerotic heart disease of native coronary artery with unspecified angina pectoris: Secondary | ICD-10-CM

## 2020-04-02 DIAGNOSIS — I495 Sick sinus syndrome: Secondary | ICD-10-CM

## 2020-04-02 NOTE — Patient Instructions (Signed)

## 2020-04-02 NOTE — Progress Notes (Signed)
Cardiology Office Note  Date: 04/02/2020   ID: [name] [name], DOB 08/31/1945, MRN [id number]  PCP:  Curlene Labrum, MD  Cardiologist:  Rozann Lesches, MD Electrophysiologist:  Thompson Grayer, MD   Chief Complaint  Patient presents with  . Cardiac follow-up    History of Present Illness: [name] [name] is a 75 y.o. female last seen in August 2021.  She is here for a routine visit.  Reports no interval palpitations or chest pain, remains functional with ADLs.  She sees [name], Medtronic pacemaker in place.  Recent device check indicated normal function.  We went over her medications which are listed below.  She does not report any spontaneous bleeding problems on Xarelto.  She will have lab work with PCP at physical in the next few months.  Past Medical History:  Diagnosis Date  . Atrial fibrillation ([place])   . Atrial flutter ([place])   . Coronary atherosclerosis of native coronary artery    BMS to circumflex 9/08  . HTN (hypertension)   . Hyperlipidemia   . Multiple thyroid nodules   . 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 Type 2 diabetes mellitus ([place])   . Urticaria     Past Surgical History:  Procedure Laterality Date  . BREAST LUMPECTOMY    . COLONOSCOPY  2014    Last colonoscopy 2014 by Dr. Britta Mccreedy. Internal hemorrhoids, sigmoid diverticulosis, multiple sessile polyps, tubular adenomas.   Marland Kitchen PACEMAKER PLACEMENT     Medtronic  . ROTATOR CUFF REPAIR    . VENTRAL HERNIA REPAIR    . VESICOVAGINAL FISTULA CLOSURE W/ TAH      Current Outpatient Medications  Medication Sig Dispense Refill  . Cholecalciferol (D-3-5) 125 MCG (5000 UT) capsule Take 5,000 Units by mouth daily.    Marland Kitchen diltiazem (DILACOR XR) 120 MG 24 hr capsule Take 2 capsules (240 mg total) by mouth daily. In the morning 180 capsule 2  . diltiazem (DILACOR XR) 180 MG 24 hr capsule Take 1 capsule (180 mg total) by mouth at bedtime. 90 capsule 3  . dofetilide (TIKOSYN) 500 MCG capsule TAKE 1  CAPSULE BY MOUTH  TWICE DAILY 180 capsule 3  . empagliflozin (JARDIANCE) 10 MG TABS tablet Take 10 mg by mouth daily.    . nitroGLYCERIN (NITROSTAT) 0.4 MG SL tablet Place 1 tablet (0.4 mg total) under the tongue every 5 (five) minutes x 3 doses as needed for chest pain (if no relief after 3 rd dose, proceed to ED for an evaluation). 75 tablet 3  . potassium chloride SA (KLOR-CON) 20 MEQ tablet TAKE 1 TABLET BY MOUTH  EVERY OTHER DAY 45 tablet 3  . pravastatin (PRAVACHOL) 40 MG tablet TAKE 1 TABLET BY MOUTH IN  THE EVENING (Patient taking differently: Take 40 mg by mouth every evening.) 90 tablet 3  . rivaroxaban (XARELTO) 20 MG TABS tablet Take 1 tablet (20 mg total) by mouth every evening. 90 tablet 3  . RYBELSUS 14 MG TABS Take 14 mg by mouth daily.    Marland Kitchen losartan (COZAAR) 25 MG tablet Take 1 tablet (25 mg total) by mouth daily. 90 tablet 3   No current facility-administered medications for this visit.   Allergies:  Codeine, Doxycycline, Lisinopril, and Metformin and related   ROS: No syncope.  Physical Exam: VS:  BP 132/68   Pulse 83   Ht 5\' 3"  (1.6 m)   Wt 179 lb (81.2 kg)   SpO2 94%  BMI 31.71 kg/m , BMI Body mass index is 31.71 kg/m.  Wt Readings from Last 3 Encounters:  04/02/20 179 lb (81.2 kg)  11/17/19 183 lb 3.2 oz (83.1 kg)  10/09/19 179 lb (81.2 kg)    General: Patient appears comfortable at rest. HEENT: Conjunctiva and lids normal, wearing a mask. Neck: Supple, no elevated JVP or carotid bruits, no thyromegaly. Lungs: Clear to auscultation, nonlabored breathing at rest. Cardiac: Regular rate and rhythm, no S3 or significant systolic murmur, no pericardial rub. Extremities: No pitting edema.  ECG:  An ECG dated 07/04/2019 was personally reviewed today and demonstrated:  Ventricular pacing with probable underlying atrial fibrillation, nonspecific ST-T changes.  Recent Labwork:  September 2021: Potassium 4.6, BUN 20, creatinine 0.98  Other Studies Reviewed  Today:  Echocardiogram 09/17/2017: Study Conclusions  - Left ventricle: The cavity size was normal. Wall thickness was increased in a pattern of moderate LVH. Systolic function was normal. The estimated ejection fraction was in the range of 60% to 65%. Wall motion was normal; there were no regional wall motion abnormalities. The study is not technically sufficient to allow evaluation of LV diastolic function. - Aortic valve: Valve area (VTI): 3.43 cm^2. Valve area (Vmax): 3.06 cm^2.  Assessment and Plan:  1.  Paroxysmal atrial fibrillation, CHA2DS2-VASc score is 6.  Rhythm has been well controlled on current regimen including Cardizem CD, Tikosyn, and Xarelto for stroke prophylaxis.  No reported spontaneous bleeding problems.  She will have follow-up lab work with PCP in the next few months.  2.  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  She continues to follow with [name].  Device function normal on recent interrogation.  3.  CAD status post BMS to the circumflex, no active angina on medical therapy.  Continue Pravachol.  Medication Adjustments/Labs and Tests Ordered: Current medicines are reviewed at length with the patient today.  Concerns regarding medicines are outlined above.   Tests Ordered: No orders of the defined types were placed in this encounter.   Medication Changes: No orders of the defined types were placed in this encounter.   Disposition:  Follow up 6 months in the [place] office.  Signed, Satira Sark, MD, [facility] 04/02/2020 2:15 PM    [facility] [place], [place], [place] [address] Phone: [phone]; Fax: [phone]

## 2020-04-14 ENCOUNTER — Other Ambulatory Visit: Payer: Self-pay | Admitting: Cardiology

## 2020-04-15 ENCOUNTER — Ambulatory Visit: Payer: Medicare Other | Admitting: Cardiology

## 2020-04-15 DIAGNOSIS — I480 Paroxysmal atrial fibrillation: Secondary | ICD-10-CM | POA: Diagnosis not present

## 2020-04-15 DIAGNOSIS — E1165 Type 2 diabetes mellitus with hyperglycemia: Secondary | ICD-10-CM | POA: Diagnosis not present

## 2020-04-15 DIAGNOSIS — Z7984 Long term (current) use of oral hypoglycemic drugs: Secondary | ICD-10-CM | POA: Diagnosis not present

## 2020-04-15 DIAGNOSIS — I10 Essential (primary) hypertension: Secondary | ICD-10-CM | POA: Diagnosis not present

## 2020-04-24 DIAGNOSIS — E876 Hypokalemia: Secondary | ICD-10-CM | POA: Diagnosis not present

## 2020-04-24 DIAGNOSIS — E7849 Other hyperlipidemia: Secondary | ICD-10-CM | POA: Diagnosis not present

## 2020-04-24 DIAGNOSIS — E782 Mixed hyperlipidemia: Secondary | ICD-10-CM | POA: Diagnosis not present

## 2020-04-24 DIAGNOSIS — R5383 Other fatigue: Secondary | ICD-10-CM | POA: Diagnosis not present

## 2020-04-24 DIAGNOSIS — E1165 Type 2 diabetes mellitus with hyperglycemia: Secondary | ICD-10-CM | POA: Diagnosis not present

## 2020-04-25 DIAGNOSIS — Q181 Preauricular sinus and cyst: Secondary | ICD-10-CM | POA: Diagnosis not present

## 2020-04-25 DIAGNOSIS — J209 Acute bronchitis, unspecified: Secondary | ICD-10-CM | POA: Diagnosis not present

## 2020-04-29 DIAGNOSIS — J019 Acute sinusitis, unspecified: Secondary | ICD-10-CM | POA: Diagnosis not present

## 2020-04-29 DIAGNOSIS — J209 Acute bronchitis, unspecified: Secondary | ICD-10-CM | POA: Diagnosis not present

## 2020-05-01 DIAGNOSIS — Z7984 Long term (current) use of oral hypoglycemic drugs: Secondary | ICD-10-CM | POA: Diagnosis not present

## 2020-05-01 DIAGNOSIS — E1165 Type 2 diabetes mellitus with hyperglycemia: Secondary | ICD-10-CM | POA: Diagnosis not present

## 2020-05-01 DIAGNOSIS — I480 Paroxysmal atrial fibrillation: Secondary | ICD-10-CM | POA: Diagnosis not present

## 2020-05-01 DIAGNOSIS — I10 Essential (primary) hypertension: Secondary | ICD-10-CM | POA: Diagnosis not present

## 2020-05-06 DIAGNOSIS — E7800 Pure hypercholesterolemia, unspecified: Secondary | ICD-10-CM | POA: Diagnosis not present

## 2020-05-06 DIAGNOSIS — D751 Secondary polycythemia: Secondary | ICD-10-CM | POA: Diagnosis not present

## 2020-05-06 DIAGNOSIS — I110 Hypertensive heart disease with heart failure: Secondary | ICD-10-CM | POA: Diagnosis not present

## 2020-05-06 DIAGNOSIS — Z7901 Long term (current) use of anticoagulants: Secondary | ICD-10-CM | POA: Diagnosis not present

## 2020-05-06 DIAGNOSIS — Z8249 Family history of ischemic heart disease and other diseases of the circulatory system: Secondary | ICD-10-CM | POA: Diagnosis not present

## 2020-05-06 DIAGNOSIS — I252 Old myocardial infarction: Secondary | ICD-10-CM | POA: Diagnosis not present

## 2020-05-06 DIAGNOSIS — Z950 Presence of cardiac pacemaker: Secondary | ICD-10-CM | POA: Diagnosis not present

## 2020-05-06 DIAGNOSIS — I2510 Atherosclerotic heart disease of native coronary artery without angina pectoris: Secondary | ICD-10-CM | POA: Diagnosis not present

## 2020-05-06 DIAGNOSIS — Z7984 Long term (current) use of oral hypoglycemic drugs: Secondary | ICD-10-CM | POA: Diagnosis not present

## 2020-05-06 DIAGNOSIS — Z885 Allergy status to narcotic agent status: Secondary | ICD-10-CM | POA: Diagnosis not present

## 2020-05-06 DIAGNOSIS — Z888 Allergy status to other drugs, medicaments and biological substances status: Secondary | ICD-10-CM | POA: Diagnosis not present

## 2020-05-06 DIAGNOSIS — E0865 Diabetes mellitus due to underlying condition with hyperglycemia: Secondary | ICD-10-CM | POA: Diagnosis not present

## 2020-05-06 DIAGNOSIS — Z823 Family history of stroke: Secondary | ICD-10-CM | POA: Diagnosis not present

## 2020-05-06 DIAGNOSIS — I4891 Unspecified atrial fibrillation: Secondary | ICD-10-CM | POA: Diagnosis not present

## 2020-05-06 DIAGNOSIS — I509 Heart failure, unspecified: Secondary | ICD-10-CM | POA: Diagnosis not present

## 2020-05-06 DIAGNOSIS — Z7189 Other specified counseling: Secondary | ICD-10-CM | POA: Diagnosis not present

## 2020-05-06 DIAGNOSIS — I4811 Longstanding persistent atrial fibrillation: Secondary | ICD-10-CM | POA: Diagnosis not present

## 2020-05-06 DIAGNOSIS — Z79899 Other long term (current) drug therapy: Secondary | ICD-10-CM | POA: Diagnosis not present

## 2020-05-06 DIAGNOSIS — E119 Type 2 diabetes mellitus without complications: Secondary | ICD-10-CM | POA: Diagnosis not present

## 2020-05-15 DIAGNOSIS — I480 Paroxysmal atrial fibrillation: Secondary | ICD-10-CM | POA: Diagnosis not present

## 2020-05-15 DIAGNOSIS — I10 Essential (primary) hypertension: Secondary | ICD-10-CM | POA: Diagnosis not present

## 2020-05-15 DIAGNOSIS — E1165 Type 2 diabetes mellitus with hyperglycemia: Secondary | ICD-10-CM | POA: Diagnosis not present

## 2020-05-15 DIAGNOSIS — Z7984 Long term (current) use of oral hypoglycemic drugs: Secondary | ICD-10-CM | POA: Diagnosis not present

## 2020-05-20 DIAGNOSIS — Z7901 Long term (current) use of anticoagulants: Secondary | ICD-10-CM | POA: Diagnosis not present

## 2020-05-20 DIAGNOSIS — E0865 Diabetes mellitus due to underlying condition with hyperglycemia: Secondary | ICD-10-CM | POA: Diagnosis not present

## 2020-05-20 DIAGNOSIS — D751 Secondary polycythemia: Secondary | ICD-10-CM | POA: Diagnosis not present

## 2020-06-03 DIAGNOSIS — D751 Secondary polycythemia: Secondary | ICD-10-CM | POA: Diagnosis not present

## 2020-06-15 DIAGNOSIS — E1165 Type 2 diabetes mellitus with hyperglycemia: Secondary | ICD-10-CM | POA: Diagnosis not present

## 2020-06-15 DIAGNOSIS — I10 Essential (primary) hypertension: Secondary | ICD-10-CM | POA: Diagnosis not present

## 2020-06-15 DIAGNOSIS — I480 Paroxysmal atrial fibrillation: Secondary | ICD-10-CM | POA: Diagnosis not present

## 2020-06-15 DIAGNOSIS — Z7984 Long term (current) use of oral hypoglycemic drugs: Secondary | ICD-10-CM | POA: Diagnosis not present

## 2020-06-17 DIAGNOSIS — Z7901 Long term (current) use of anticoagulants: Secondary | ICD-10-CM | POA: Diagnosis not present

## 2020-06-17 DIAGNOSIS — E0865 Diabetes mellitus due to underlying condition with hyperglycemia: Secondary | ICD-10-CM | POA: Diagnosis not present

## 2020-06-17 DIAGNOSIS — D751 Secondary polycythemia: Secondary | ICD-10-CM | POA: Diagnosis not present

## 2020-06-17 DIAGNOSIS — Z7189 Other specified counseling: Secondary | ICD-10-CM | POA: Diagnosis not present

## 2020-06-21 ENCOUNTER — Encounter: Payer: Medicare Other | Admitting: Internal Medicine

## 2020-06-24 DIAGNOSIS — Z23 Encounter for immunization: Secondary | ICD-10-CM | POA: Diagnosis not present

## 2020-06-25 ENCOUNTER — Ambulatory Visit (INDEPENDENT_AMBULATORY_CARE_PROVIDER_SITE_OTHER): Payer: Medicare Other

## 2020-06-25 DIAGNOSIS — I495 Sick sinus syndrome: Secondary | ICD-10-CM | POA: Diagnosis not present

## 2020-06-25 LAB — CUP PACEART REMOTE DEVICE CHECK
Battery Impedance: 2521 Ohm
Battery Remaining Longevity: 27 mo
Battery Voltage: 2.74 V
Brady Statistic AP VP Percent: 0 %
Brady Statistic AP VS Percent: 59 %
Brady Statistic AS VP Percent: 0 %
Brady Statistic AS VS Percent: 41 %
Date Time Interrogation Session: 20220510101931
Implantable Lead Implant Date: 20110510
Implantable Lead Implant Date: 20110510
Implantable Lead Location: 753859
Implantable Lead Location: 753860
Implantable Lead Model: 5076
Implantable Lead Model: 5092
Implantable Pulse Generator Implant Date: 20110510
Lead Channel Impedance Value: 463 Ohm
Lead Channel Impedance Value: 505 Ohm
Lead Channel Pacing Threshold Amplitude: 0.5 V
Lead Channel Pacing Threshold Amplitude: 1.5 V
Lead Channel Pacing Threshold Pulse Width: 0.4 ms
Lead Channel Pacing Threshold Pulse Width: 0.4 ms
Lead Channel Setting Pacing Amplitude: 2 V
Lead Channel Setting Pacing Amplitude: 3 V
Lead Channel Setting Pacing Pulse Width: 0.4 ms
Lead Channel Setting Sensing Sensitivity: 2.8 mV

## 2020-06-26 DIAGNOSIS — D751 Secondary polycythemia: Secondary | ICD-10-CM | POA: Diagnosis not present

## 2020-07-08 DIAGNOSIS — G8929 Other chronic pain: Secondary | ICD-10-CM | POA: Diagnosis not present

## 2020-07-08 DIAGNOSIS — D751 Secondary polycythemia: Secondary | ICD-10-CM | POA: Diagnosis not present

## 2020-07-08 DIAGNOSIS — E1100 Type 2 diabetes mellitus with hyperosmolarity without nonketotic hyperglycemic-hyperosmolar coma (NKHHC): Secondary | ICD-10-CM | POA: Diagnosis not present

## 2020-07-08 DIAGNOSIS — Z7901 Long term (current) use of anticoagulants: Secondary | ICD-10-CM | POA: Diagnosis not present

## 2020-07-08 DIAGNOSIS — R109 Unspecified abdominal pain: Secondary | ICD-10-CM | POA: Diagnosis not present

## 2020-07-08 DIAGNOSIS — E559 Vitamin D deficiency, unspecified: Secondary | ICD-10-CM | POA: Diagnosis not present

## 2020-07-10 DIAGNOSIS — E1165 Type 2 diabetes mellitus with hyperglycemia: Secondary | ICD-10-CM | POA: Diagnosis not present

## 2020-07-10 DIAGNOSIS — I480 Paroxysmal atrial fibrillation: Secondary | ICD-10-CM | POA: Diagnosis not present

## 2020-07-10 DIAGNOSIS — K649 Unspecified hemorrhoids: Secondary | ICD-10-CM | POA: Diagnosis not present

## 2020-07-10 DIAGNOSIS — I10 Essential (primary) hypertension: Secondary | ICD-10-CM | POA: Diagnosis not present

## 2020-07-10 DIAGNOSIS — E042 Nontoxic multinodular goiter: Secondary | ICD-10-CM | POA: Diagnosis not present

## 2020-07-10 DIAGNOSIS — E7849 Other hyperlipidemia: Secondary | ICD-10-CM | POA: Diagnosis not present

## 2020-07-10 DIAGNOSIS — R609 Edema, unspecified: Secondary | ICD-10-CM | POA: Diagnosis not present

## 2020-07-15 DIAGNOSIS — E1165 Type 2 diabetes mellitus with hyperglycemia: Secondary | ICD-10-CM | POA: Diagnosis not present

## 2020-07-15 DIAGNOSIS — Z7984 Long term (current) use of oral hypoglycemic drugs: Secondary | ICD-10-CM | POA: Diagnosis not present

## 2020-07-15 DIAGNOSIS — I10 Essential (primary) hypertension: Secondary | ICD-10-CM | POA: Diagnosis not present

## 2020-07-15 DIAGNOSIS — I480 Paroxysmal atrial fibrillation: Secondary | ICD-10-CM | POA: Diagnosis not present

## 2020-07-17 NOTE — Progress Notes (Signed)
Remote pacemaker transmission.   

## 2020-07-18 DIAGNOSIS — D751 Secondary polycythemia: Secondary | ICD-10-CM | POA: Diagnosis not present

## 2020-08-11 ENCOUNTER — Other Ambulatory Visit: Payer: Self-pay | Admitting: Cardiology

## 2020-08-12 NOTE — Telephone Encounter (Signed)
Prescription refill request for Xarelto received.  Indication: Atrial fib Last office visit: 04/02/20  [name] [name] Weight: 81.2kg Age: 75 Scr: 0.97 CrCl: 62.26  Based on above findings Xarelto 20mg  once daily is the appropriate dose.  Refill approved.

## 2020-08-15 DIAGNOSIS — Z7984 Long term (current) use of oral hypoglycemic drugs: Secondary | ICD-10-CM | POA: Diagnosis not present

## 2020-08-15 DIAGNOSIS — E1165 Type 2 diabetes mellitus with hyperglycemia: Secondary | ICD-10-CM | POA: Diagnosis not present

## 2020-08-15 DIAGNOSIS — I480 Paroxysmal atrial fibrillation: Secondary | ICD-10-CM | POA: Diagnosis not present

## 2020-08-15 DIAGNOSIS — I10 Essential (primary) hypertension: Secondary | ICD-10-CM | POA: Diagnosis not present

## 2020-09-10 DIAGNOSIS — Z7901 Long term (current) use of anticoagulants: Secondary | ICD-10-CM | POA: Diagnosis not present

## 2020-09-10 DIAGNOSIS — R718 Other abnormality of red blood cells: Secondary | ICD-10-CM | POA: Diagnosis not present

## 2020-09-10 DIAGNOSIS — E559 Vitamin D deficiency, unspecified: Secondary | ICD-10-CM | POA: Diagnosis not present

## 2020-09-10 DIAGNOSIS — D751 Secondary polycythemia: Secondary | ICD-10-CM | POA: Diagnosis not present

## 2020-09-15 DIAGNOSIS — Z7984 Long term (current) use of oral hypoglycemic drugs: Secondary | ICD-10-CM | POA: Diagnosis not present

## 2020-09-15 DIAGNOSIS — E1165 Type 2 diabetes mellitus with hyperglycemia: Secondary | ICD-10-CM | POA: Diagnosis not present

## 2020-09-15 DIAGNOSIS — I10 Essential (primary) hypertension: Secondary | ICD-10-CM | POA: Diagnosis not present

## 2020-09-15 DIAGNOSIS — I480 Paroxysmal atrial fibrillation: Secondary | ICD-10-CM | POA: Diagnosis not present

## 2020-09-17 DIAGNOSIS — R799 Abnormal finding of blood chemistry, unspecified: Secondary | ICD-10-CM | POA: Diagnosis not present

## 2020-09-17 DIAGNOSIS — D751 Secondary polycythemia: Secondary | ICD-10-CM | POA: Diagnosis not present

## 2020-09-23 DIAGNOSIS — D751 Secondary polycythemia: Secondary | ICD-10-CM | POA: Diagnosis not present

## 2020-09-23 DIAGNOSIS — Z7901 Long term (current) use of anticoagulants: Secondary | ICD-10-CM | POA: Diagnosis not present

## 2020-09-23 DIAGNOSIS — E0865 Diabetes mellitus due to underlying condition with hyperglycemia: Secondary | ICD-10-CM | POA: Diagnosis not present

## 2020-09-24 ENCOUNTER — Ambulatory Visit (INDEPENDENT_AMBULATORY_CARE_PROVIDER_SITE_OTHER): Payer: Medicare Other

## 2020-09-24 DIAGNOSIS — I495 Sick sinus syndrome: Secondary | ICD-10-CM

## 2020-09-24 LAB — CUP PACEART REMOTE DEVICE CHECK
Battery Impedance: 2687 Ohm
Battery Remaining Longevity: 25 mo
Battery Voltage: 2.73 V
Brady Statistic AP VP Percent: 0 %
Brady Statistic AP VS Percent: 59 %
Brady Statistic AS VP Percent: 0 %
Brady Statistic AS VS Percent: 40 %
Date Time Interrogation Session: 20220809132346
Implantable Lead Implant Date: 20110510
Implantable Lead Implant Date: 20110510
Implantable Lead Location: 753859
Implantable Lead Location: 753860
Implantable Lead Model: 5076
Implantable Lead Model: 5092
Implantable Pulse Generator Implant Date: 20110510
Lead Channel Impedance Value: 441 Ohm
Lead Channel Impedance Value: 573 Ohm
Lead Channel Pacing Threshold Amplitude: 0.625 V
Lead Channel Pacing Threshold Amplitude: 1.25 V
Lead Channel Pacing Threshold Pulse Width: 0.4 ms
Lead Channel Pacing Threshold Pulse Width: 0.4 ms
Lead Channel Setting Pacing Amplitude: 2 V
Lead Channel Setting Pacing Amplitude: 2.75 V
Lead Channel Setting Pacing Pulse Width: 0.46 ms
Lead Channel Setting Sensing Sensitivity: 2.8 mV

## 2020-09-26 ENCOUNTER — Other Ambulatory Visit: Payer: Self-pay | Admitting: Internal Medicine

## 2020-10-11 ENCOUNTER — Encounter: Payer: Self-pay | Admitting: Cardiology

## 2020-10-11 ENCOUNTER — Other Ambulatory Visit: Payer: Self-pay

## 2020-10-11 ENCOUNTER — Ambulatory Visit (INDEPENDENT_AMBULATORY_CARE_PROVIDER_SITE_OTHER): Payer: Medicare Other | Admitting: Cardiology

## 2020-10-11 VITALS — BP 130/60 | HR 67 | Ht 63.0 in | Wt 162.8 lb

## 2020-10-11 DIAGNOSIS — I495 Sick sinus syndrome: Secondary | ICD-10-CM | POA: Diagnosis not present

## 2020-10-11 DIAGNOSIS — I25119 Atherosclerotic heart disease of native coronary artery with unspecified angina pectoris: Secondary | ICD-10-CM

## 2020-10-11 DIAGNOSIS — I480 Paroxysmal atrial fibrillation: Secondary | ICD-10-CM | POA: Diagnosis not present

## 2020-10-11 NOTE — Patient Instructions (Addendum)

## 2020-10-11 NOTE — Progress Notes (Signed)
Cardiology Office Note  Date: 10/11/2020   ID: [name] [name], DOB February 11, 1946, MRN [id number]  PCP:  Curlene Labrum, MD  Cardiologist:  Rozann Lesches, MD Electrophysiologist:  Thompson Grayer, MD   Chief Complaint  Patient presents with   Cardiac follow-up    History of Present Illness: [name] [name] is a 75 y.o. female last seen in February.  She is here for a routine visit.  Reports no exertional chest pain, no significant sense of palpitations.  She has a Medtronic pacemaker in place with followed by [name].  Recent device check revealed normal function with 4.8% AF burden.  We went over her medications which are stable from a cardiac perspective.  She does not report any bleeding problems on Xarelto.  I personally reviewed her ECG today which shows an atrial paced rhythm, QTc 458 ms.  Recent lab work as noted below.  Past Medical History:  Diagnosis Date   Atrial fibrillation [facility])    Atrial flutter ([place])    Coronary atherosclerosis of native coronary artery    BMS to circumflex 9/08   HTN (hypertension)    Hyperlipidemia    Multiple thyroid nodules    Tachy-brady syndrome (HCC)    PPM - Medtronic   Type 2 diabetes mellitus ([place])    Urticaria     Past Surgical History:  Procedure Laterality Date   BREAST LUMPECTOMY     COLONOSCOPY  2014    Last colonoscopy 2014 by Dr. Britta Mccreedy. Internal hemorrhoids, sigmoid diverticulosis, multiple sessile polyps, tubular adenomas.    PACEMAKER PLACEMENT     Medtronic   ROTATOR CUFF REPAIR     VENTRAL HERNIA REPAIR     VESICOVAGINAL FISTULA CLOSURE W/ TAH      Current Outpatient Medications  Medication Sig Dispense Refill   Cholecalciferol (D-3-5) 125 MCG (5000 UT) capsule Take 5,000 Units by mouth daily.     diltiazem (DILACOR XR) 120 MG 24 hr capsule Take 2 capsules (240 mg total) by mouth daily. In the morning 180 capsule 2   diltiazem (DILACOR XR) 180 MG 24 hr capsule Take 1 capsule (180 mg total) by mouth at bedtime.  90 capsule 3   dofetilide (TIKOSYN) 500 MCG capsule TAKE 1 CAPSULE BY MOUTH  TWICE DAILY 180 capsule 0   JARDIANCE 25 MG TABS tablet Take 25 mg by mouth daily.     losartan (COZAAR) 25 MG tablet Take 1 tablet (25 mg total) by mouth daily. 90 tablet 3   nitroGLYCERIN (NITROSTAT) 0.4 MG SL tablet Place 1 tablet (0.4 mg total) under the tongue every 5 (five) minutes x 3 doses as needed for chest pain (if no relief after 3 rd dose, proceed to ED for an evaluation). 75 tablet 3   potassium chloride SA (KLOR-CON) 20 MEQ tablet TAKE 1 TABLET BY MOUTH  EVERY OTHER DAY 45 tablet 3   pravastatin (PRAVACHOL) 40 MG tablet TAKE 1 TABLET BY MOUTH IN  THE EVENING 90 tablet 3   rivaroxaban (XARELTO) 20 MG TABS tablet TAKE 1 TABLET BY MOUTH IN  THE EVENING 90 tablet 1   RYBELSUS 14 MG TABS Take 14 mg by mouth daily.     No current facility-administered medications for this visit.   Allergies:  Benzonatate, Codeine, Doxycycline, Lisinopril, and Metformin and related   ROS: No syncope.  Physical Exam: VS:  BP 130/60   Pulse 67   Ht '5\' 3"'$  (1.6 m)   Wt 162 lb 12.8 oz (73.8  kg)   SpO2 95%   BMI 28.84 kg/m , BMI Body mass index is 28.84 kg/m.  Wt Readings from Last 3 Encounters:  10/11/20 162 lb 12.8 oz (73.8 kg)  04/02/20 179 lb (81.2 kg)  11/17/19 183 lb 3.2 oz (83.1 kg)    General: Patient appears comfortable at rest. HEENT: Conjunctiva and lids normal, wearing a mask. Neck: Supple, no elevated JVP or carotid bruits, no thyromegaly. Lungs: Clear to auscultation, nonlabored breathing at rest. Cardiac: Regular rate and rhythm, no S3 or significant systolic murmur. Extremities: No pitting edema.  ECG:  An ECG dated 07/04/2019 was personally reviewed today and demonstrated:  Ventricular pacing with probable underlying atrial fibrillation, nonspecific ST-T changes.  Recent Labwork:  May 2022: hemoglobin 15.6, platelets 210 July 2022: Hemoglobin 16.1, platelets 189, potassium 4.4, BUN 20, creatinine  0.81, AST 22, ALT 21  Other Studies Reviewed Today:  Echocardiogram 09/17/2017: Study Conclusions   - Left ventricle: The cavity size was normal. Wall thickness was   increased in a pattern of moderate LVH. Systolic function was   normal. The estimated ejection fraction was in the range of 60%   to 65%. Wall motion was normal; there were no regional wall   motion abnormalities. The study is not technically sufficient to   allow evaluation of LV diastolic function. - Aortic valve: Valve area (VTI): 3.43 cm^2. Valve area (Vmax):   3.06 cm^2.  Assessment and Plan:  1.  Paroxysmal atrial fibrillation with low rhythm burden, CHA2DS2-VASc score is 6.  Plan to continue Cardizem CD and Tikosyn.  She is tolerating Xarelto without any significant bleeding problems, I reviewed her recent lab work.  2.  Tachycardia-bradycardia syndrome, Medtronic pacemaker in place with followed by [name].  3.  CAD status post BMS to the circumflex.  We will continue to follow her expectantly on medical therapy in the absence of angina symptoms.  Continue Pravachol and Cozaar.  Medication Adjustments/Labs and Tests Ordered: Current medicines are reviewed at length with the patient today.  Concerns regarding medicines are outlined above.   Tests Ordered: Orders Placed This Encounter  Procedures   EKG 12-Lead     Medication Changes: No orders of the defined types were placed in this encounter.   Disposition:  Follow up  6 months.  Signed, Satira Sark, MD, [facility] 10/11/2020 2:25 PM    [facility] at [place], [place], [place] [address] Phone: [phone]; Fax: [phone]

## 2020-10-15 ENCOUNTER — Other Ambulatory Visit: Payer: Self-pay | Admitting: Cardiology

## 2020-10-17 NOTE — Progress Notes (Signed)
Remote pacemaker transmission.   

## 2020-10-18 DIAGNOSIS — D751 Secondary polycythemia: Secondary | ICD-10-CM | POA: Diagnosis not present

## 2020-10-18 DIAGNOSIS — K625 Hemorrhage of anus and rectum: Secondary | ICD-10-CM | POA: Diagnosis not present

## 2020-10-18 DIAGNOSIS — Z7901 Long term (current) use of anticoagulants: Secondary | ICD-10-CM | POA: Diagnosis not present

## 2020-10-18 DIAGNOSIS — E559 Vitamin D deficiency, unspecified: Secondary | ICD-10-CM | POA: Diagnosis not present

## 2020-11-06 ENCOUNTER — Other Ambulatory Visit: Payer: Self-pay | Admitting: Cardiology

## 2020-11-11 DIAGNOSIS — R059 Cough, unspecified: Secondary | ICD-10-CM | POA: Diagnosis not present

## 2020-11-22 ENCOUNTER — Ambulatory Visit (INDEPENDENT_AMBULATORY_CARE_PROVIDER_SITE_OTHER): Payer: Medicare Other | Admitting: Internal Medicine

## 2020-11-22 VITALS — BP 122/64 | HR 64 | Ht 63.0 in | Wt 159.6 lb

## 2020-11-22 DIAGNOSIS — I480 Paroxysmal atrial fibrillation: Secondary | ICD-10-CM | POA: Diagnosis not present

## 2020-11-22 DIAGNOSIS — Z23 Encounter for immunization: Secondary | ICD-10-CM | POA: Diagnosis not present

## 2020-11-22 DIAGNOSIS — I495 Sick sinus syndrome: Secondary | ICD-10-CM

## 2020-11-22 DIAGNOSIS — I10 Essential (primary) hypertension: Secondary | ICD-10-CM | POA: Diagnosis not present

## 2020-11-22 NOTE — Progress Notes (Signed)
PCP: Curlene Labrum, MD Primary Cardiologist: Dr Domenic Polite Primary EP:  Dr Rayann Heman  [name] [name] is a 75 y.o. female who presents today for routine electrophysiology followup.  Since last being seen in our clinic, the patient reports doing very well.  Today, she denies symptoms of palpitations, chest pain, shortness of breath,  lower extremity edema, dizziness, presyncope, or syncope.  The patient is otherwise without complaint today.   Past Medical History:  Diagnosis Date   Atrial fibrillation [facility])    Atrial flutter ([place])    Coronary atherosclerosis of native coronary artery    BMS to circumflex 9/08   HTN (hypertension)    Hyperlipidemia    Multiple thyroid nodules    Tachy-brady syndrome (HCC)    PPM - Medtronic   Type 2 diabetes mellitus ([place])    Urticaria    Past Surgical History:  Procedure Laterality Date   BREAST LUMPECTOMY     COLONOSCOPY  2014    Last colonoscopy 2014 by Dr. Britta Mccreedy. Internal hemorrhoids, sigmoid diverticulosis, multiple sessile polyps, tubular adenomas.    PACEMAKER PLACEMENT     Medtronic   ROTATOR CUFF REPAIR     VENTRAL HERNIA REPAIR     VESICOVAGINAL FISTULA CLOSURE W/ TAH      ROS- all systems are reviewed and negative except as per HPI above  Current Outpatient Medications  Medication Sig Dispense Refill   Cholecalciferol (D-3-5) 125 MCG (5000 UT) capsule Take 5,000 Units by mouth daily.     DILT-XR 120 MG 24 hr capsule TAKE 2 CAPSULES BY MOUTH  DAILY IN THE MORNING 180 capsule 3   diltiazem (DILACOR XR) 180 MG 24 hr capsule Take 1 capsule (180 mg total) by mouth at bedtime. 90 capsule 3   dofetilide (TIKOSYN) 500 MCG capsule TAKE 1 CAPSULE BY MOUTH  TWICE DAILY 180 capsule 0   JARDIANCE 25 MG TABS tablet Take 25 mg by mouth daily.     losartan (COZAAR) 25 MG tablet Take 1 tablet (25 mg total) by mouth daily. 90 tablet 3   nitroGLYCERIN (NITROSTAT) 0.4 MG SL tablet Place 1 tablet (0.4 mg total) under the tongue every 5 (five)  minutes x 3 doses as needed for chest pain (if no relief after 3 rd dose, proceed to ED for an evaluation). 75 tablet 3   nitroGLYCERIN (NITROSTAT) 0.4 MG SL tablet See admin instructions.     potassium chloride SA (KLOR-CON) 20 MEQ tablet TAKE 1 TABLET BY MOUTH  EVERY OTHER DAY 45 tablet 3   pravastatin (PRAVACHOL) 40 MG tablet TAKE 1 TABLET BY MOUTH IN  THE EVENING 90 tablet 3   rivaroxaban (XARELTO) 20 MG TABS tablet TAKE 1 TABLET BY MOUTH IN  THE EVENING 90 tablet 1   RYBELSUS 14 MG TABS Take 14 mg by mouth daily.     No current facility-administered medications for this visit.    Physical Exam: Vitals:   11/22/20 1131  BP: 122/64  Pulse: 64  SpO2: 97%  Weight: 159 lb 9.6 oz (72.4 kg)  Height: 5\' 3"  (1.6 m)    GEN- The patient is well appearing, alert and oriented x 3 today.   Head- normocephalic, atraumatic Eyes-  Sclera clear, conjunctiva pink Ears- hearing intact Oropharynx- clear Lungs- Clear to ausculation bilaterally, normal work of breathing Chest- pacemaker pocket is well healed Heart- Regular rate and rhythm, no murmurs, rubs or gallops, PMI not laterally displaced GI- soft, NT, ND, + BS Extremities- no clubbing, cyanosis,  or edema  Pacemaker interrogation- reviewed in detail today,  See PACEART report  ekg tracing 10/11/20 reveals atrial pacing, first degree AV block, stable qtc  Assessment and Plan:  1. Symptomatic sinus bradycardia  Normal pacemaker function See Pace Art report No changes today she is not device dependant today  2. Paroxysmal atrial fibrillation Well controlled with tikosyn Labs 9/22 reviewed, 8/22 ekg reviewed.  Qt is stable. Chads2vasc score is 6.  Continue xarelto  3. CAD No ischemic symptoms  4. HTN Stable No change required today   Risks, benefits and potential toxicities for medications prescribed and/or refilled reviewed with patient today.   Follow-up 2/23 with Dr Domenic Polite as scheduled I will see in a year  Thompson Grayer MD, [facility] 11/22/2020 11:52 AM

## 2020-11-22 NOTE — Patient Instructions (Signed)
Medication Instructions:  Continue all current medications.  Labwork: none  Testing/Procedures: none  Follow-Up: 1 year - Dr.  Allred   Any Other Special Instructions Will Be Listed Below (If Applicable).   If you need a refill on your cardiac medications before your next appointment, please call your pharmacy.  

## 2020-12-16 DIAGNOSIS — E1165 Type 2 diabetes mellitus with hyperglycemia: Secondary | ICD-10-CM | POA: Diagnosis not present

## 2020-12-16 DIAGNOSIS — Z7984 Long term (current) use of oral hypoglycemic drugs: Secondary | ICD-10-CM | POA: Diagnosis not present

## 2020-12-16 DIAGNOSIS — I480 Paroxysmal atrial fibrillation: Secondary | ICD-10-CM | POA: Diagnosis not present

## 2020-12-16 DIAGNOSIS — I10 Essential (primary) hypertension: Secondary | ICD-10-CM | POA: Diagnosis not present

## 2020-12-24 ENCOUNTER — Ambulatory Visit (INDEPENDENT_AMBULATORY_CARE_PROVIDER_SITE_OTHER): Payer: Medicare Other

## 2020-12-24 DIAGNOSIS — I480 Paroxysmal atrial fibrillation: Secondary | ICD-10-CM | POA: Diagnosis not present

## 2020-12-24 DIAGNOSIS — E782 Mixed hyperlipidemia: Secondary | ICD-10-CM | POA: Diagnosis not present

## 2020-12-24 DIAGNOSIS — R609 Edema, unspecified: Secondary | ICD-10-CM | POA: Diagnosis not present

## 2020-12-24 DIAGNOSIS — I495 Sick sinus syndrome: Secondary | ICD-10-CM

## 2020-12-24 DIAGNOSIS — Z1389 Encounter for screening for other disorder: Secondary | ICD-10-CM | POA: Diagnosis not present

## 2020-12-24 DIAGNOSIS — E1165 Type 2 diabetes mellitus with hyperglycemia: Secondary | ICD-10-CM | POA: Diagnosis not present

## 2020-12-24 DIAGNOSIS — M545 Low back pain, unspecified: Secondary | ICD-10-CM | POA: Diagnosis not present

## 2020-12-24 DIAGNOSIS — E042 Nontoxic multinodular goiter: Secondary | ICD-10-CM | POA: Diagnosis not present

## 2020-12-24 DIAGNOSIS — I10 Essential (primary) hypertension: Secondary | ICD-10-CM | POA: Diagnosis not present

## 2020-12-25 LAB — CUP PACEART REMOTE DEVICE CHECK
Battery Impedance: 3278 Ohm
Battery Remaining Longevity: 18 mo
Battery Voltage: 2.71 V
Brady Statistic AP VP Percent: 0 %
Brady Statistic AP VS Percent: 68 %
Brady Statistic AS VP Percent: 0 %
Brady Statistic AS VS Percent: 32 %
Date Time Interrogation Session: 20221108205157
Implantable Lead Implant Date: 20110510
Implantable Lead Implant Date: 20110510
Implantable Lead Location: 753859
Implantable Lead Location: 753860
Implantable Lead Model: 5076
Implantable Lead Model: 5092
Implantable Pulse Generator Implant Date: 20110510
Lead Channel Impedance Value: 475 Ohm
Lead Channel Impedance Value: 540 Ohm
Lead Channel Pacing Threshold Amplitude: 0.5 V
Lead Channel Pacing Threshold Amplitude: 1.125 V
Lead Channel Pacing Threshold Pulse Width: 0.4 ms
Lead Channel Pacing Threshold Pulse Width: 0.4 ms
Lead Channel Setting Pacing Amplitude: 2 V
Lead Channel Setting Pacing Amplitude: 2.5 V
Lead Channel Setting Pacing Pulse Width: 0.4 ms
Lead Channel Setting Sensing Sensitivity: 2.8 mV

## 2021-01-01 NOTE — Progress Notes (Signed)
Remote pacemaker transmission.   

## 2021-01-14 ENCOUNTER — Other Ambulatory Visit: Payer: Self-pay | Admitting: Internal Medicine

## 2021-01-14 DIAGNOSIS — E042 Nontoxic multinodular goiter: Secondary | ICD-10-CM | POA: Diagnosis not present

## 2021-01-14 DIAGNOSIS — R609 Edema, unspecified: Secondary | ICD-10-CM | POA: Diagnosis not present

## 2021-01-14 DIAGNOSIS — Z0000 Encounter for general adult medical examination without abnormal findings: Secondary | ICD-10-CM | POA: Diagnosis not present

## 2021-01-14 DIAGNOSIS — I480 Paroxysmal atrial fibrillation: Secondary | ICD-10-CM | POA: Diagnosis not present

## 2021-01-14 NOTE — Telephone Encounter (Signed)
This is a [place] pt °

## 2021-01-22 DIAGNOSIS — K625 Hemorrhage of anus and rectum: Secondary | ICD-10-CM | POA: Diagnosis not present

## 2021-01-22 DIAGNOSIS — E559 Vitamin D deficiency, unspecified: Secondary | ICD-10-CM | POA: Diagnosis not present

## 2021-01-22 DIAGNOSIS — D751 Secondary polycythemia: Secondary | ICD-10-CM | POA: Diagnosis not present

## 2021-01-22 DIAGNOSIS — Z7901 Long term (current) use of anticoagulants: Secondary | ICD-10-CM | POA: Diagnosis not present

## 2021-01-29 ENCOUNTER — Other Ambulatory Visit: Payer: Self-pay | Admitting: Cardiology

## 2021-01-29 DIAGNOSIS — R799 Abnormal finding of blood chemistry, unspecified: Secondary | ICD-10-CM | POA: Diagnosis not present

## 2021-01-29 DIAGNOSIS — D751 Secondary polycythemia: Secondary | ICD-10-CM | POA: Diagnosis not present

## 2021-02-14 DIAGNOSIS — Z7984 Long term (current) use of oral hypoglycemic drugs: Secondary | ICD-10-CM | POA: Diagnosis not present

## 2021-02-14 DIAGNOSIS — E1165 Type 2 diabetes mellitus with hyperglycemia: Secondary | ICD-10-CM | POA: Diagnosis not present

## 2021-02-14 DIAGNOSIS — I10 Essential (primary) hypertension: Secondary | ICD-10-CM | POA: Diagnosis not present

## 2021-02-14 DIAGNOSIS — I480 Paroxysmal atrial fibrillation: Secondary | ICD-10-CM | POA: Diagnosis not present

## 2021-04-01 ENCOUNTER — Other Ambulatory Visit: Payer: Self-pay | Admitting: Cardiology

## 2021-04-01 NOTE — Telephone Encounter (Signed)
Prescription refill request for Xarelto received.  Indication: PAF Last office visit: 11/22/20  Clearnce Hasten MD Weight: 72.4kg Age: 76 Scr: 0.88 on 01/22/21 CrCl: 60.22  Based on above findings Xarelto 20mg  daily is the appropriate dose.  Refill approved.

## 2021-04-07 ENCOUNTER — Ambulatory Visit (INDEPENDENT_AMBULATORY_CARE_PROVIDER_SITE_OTHER): Payer: Medicare Other

## 2021-04-07 DIAGNOSIS — I495 Sick sinus syndrome: Secondary | ICD-10-CM | POA: Diagnosis not present

## 2021-04-08 LAB — CUP PACEART REMOTE DEVICE CHECK
Battery Impedance: 4457 Ohm
Battery Remaining Longevity: 10 mo
Battery Voltage: 2.67 V
Brady Statistic AP VP Percent: 0 %
Brady Statistic AP VS Percent: 77 %
Brady Statistic AS VP Percent: 0 %
Brady Statistic AS VS Percent: 22 %
Date Time Interrogation Session: 20230220093830
Implantable Lead Implant Date: 20110510
Implantable Lead Implant Date: 20110510
Implantable Lead Location: 753859
Implantable Lead Location: 753860
Implantable Lead Model: 5076
Implantable Lead Model: 5092
Implantable Pulse Generator Implant Date: 20110510
Lead Channel Impedance Value: 502 Ohm
Lead Channel Impedance Value: 568 Ohm
Lead Channel Pacing Threshold Amplitude: 0.625 V
Lead Channel Pacing Threshold Amplitude: 1.25 V
Lead Channel Pacing Threshold Pulse Width: 0.4 ms
Lead Channel Pacing Threshold Pulse Width: 0.4 ms
Lead Channel Setting Pacing Amplitude: 2 V
Lead Channel Setting Pacing Amplitude: 2.5 V
Lead Channel Setting Pacing Pulse Width: 0.4 ms
Lead Channel Setting Sensing Sensitivity: 2.8 mV

## 2021-04-12 ENCOUNTER — Other Ambulatory Visit: Payer: Self-pay | Admitting: Cardiology

## 2021-04-14 ENCOUNTER — Encounter: Payer: Self-pay | Admitting: Cardiology

## 2021-04-14 ENCOUNTER — Ambulatory Visit (INDEPENDENT_AMBULATORY_CARE_PROVIDER_SITE_OTHER): Payer: Medicare Other | Admitting: Cardiology

## 2021-04-14 ENCOUNTER — Other Ambulatory Visit: Payer: Self-pay

## 2021-04-14 VITALS — BP 144/78 | HR 74 | Ht 63.0 in | Wt 159.0 lb

## 2021-04-14 DIAGNOSIS — I480 Paroxysmal atrial fibrillation: Secondary | ICD-10-CM | POA: Diagnosis not present

## 2021-04-14 DIAGNOSIS — I495 Sick sinus syndrome: Secondary | ICD-10-CM | POA: Diagnosis not present

## 2021-04-14 DIAGNOSIS — I25119 Atherosclerotic heart disease of native coronary artery with unspecified angina pectoris: Secondary | ICD-10-CM

## 2021-04-14 NOTE — Progress Notes (Signed)
Remote pacemaker transmission.   

## 2021-04-14 NOTE — Patient Instructions (Signed)
Medication Instructions:  Continue all current medications.   Labwork: none  Testing/Procedures: none  Follow-Up: 6 months   Any Other Special Instructions Will Be Listed Below (If Applicable).   If you need a refill on your cardiac medications before your next appointment, please call your pharmacy.  

## 2021-04-14 NOTE — Progress Notes (Signed)
Cardiology Office Note  Date: 04/14/2021   ID: [name], DOB [birth year], MRN [id number]  PCP:  Curlene Labrum, MD  Cardiologist:  Rozann Lesches, MD Electrophysiologist:  Thompson Grayer, MD   Chief Complaint  Patient presents with   Cardiac follow-up    History of Present Illness: [name] [name] is a 76 y.o. female last seen in August 2022.  She is here for a follow-up visit.  Reports no progressive sense of palpitations, no angina symptoms.  She states that she has been compliant with therapy.  She has a Medtronic pacemaker in place with follow-up by [name].  Recent device check showed normal function with 1.2% AF burden.  I reviewed her medications which are outlined below.  She has had no spontaneous bleeding problems on Xarelto, lab work from December 2022 reviewed.  She continues on Tikosyn and diltiazem CD.  Past Medical History:  Diagnosis Date   Atrial fibrillation [facility])    Atrial flutter ([place])    Coronary atherosclerosis of native coronary artery    BMS to circumflex 9/08   HTN (hypertension)    Hyperlipidemia    Multiple thyroid nodules    Tachy-brady syndrome (HCC)    PPM - Medtronic   Type 2 diabetes mellitus ([place])    Urticaria     Past Surgical History:  Procedure Laterality Date   BREAST LUMPECTOMY     COLONOSCOPY  2014    Last colonoscopy 2014 by Dr. Britta Mccreedy. Internal hemorrhoids, sigmoid diverticulosis, multiple sessile polyps, tubular adenomas.    PACEMAKER PLACEMENT     Medtronic   ROTATOR CUFF REPAIR     VENTRAL HERNIA REPAIR     VESICOVAGINAL FISTULA CLOSURE W/ TAH      Current Outpatient Medications  Medication Sig Dispense Refill   Cholecalciferol (D-3-5) 125 MCG (5000 UT) capsule Take 5,000 Units by mouth daily.     DILT-XR 120 MG 24 hr capsule TAKE 2 CAPSULES BY MOUTH  DAILY IN THE MORNING 180 capsule 3   DILT-XR 180 MG 24 hr capsule TAKE 1 CAPSULE BY MOUTH AT  BEDTIME 90 capsule 3   dofetilide (TIKOSYN) 500 MCG capsule TAKE 1  CAPSULE BY MOUTH  TWICE DAILY 180 capsule 3   JARDIANCE 25 MG TABS tablet Take 25 mg by mouth daily.     MOUNJARO 2.5 MG/0.5ML Pen Inject 0.5 mLs into the skin once a week.     nitroGLYCERIN (NITROSTAT) 0.4 MG SL tablet Place 1 tablet (0.4 mg total) under the tongue every 5 (five) minutes x 3 doses as needed for chest pain (if no relief after 3 rd dose, proceed to ED for an evaluation). 75 tablet 3   nitroGLYCERIN (NITROSTAT) 0.4 MG SL tablet See admin instructions.     potassium chloride SA (KLOR-CON) 20 MEQ tablet TAKE 1 TABLET BY MOUTH  EVERY OTHER DAY 45 tablet 3   pravastatin (PRAVACHOL) 40 MG tablet TAKE 1 TABLET BY MOUTH IN  THE EVENING 90 tablet 1   rivaroxaban (XARELTO) 20 MG TABS tablet TAKE 1 TABLET BY MOUTH IN  THE EVENING 90 tablet 1   losartan (COZAAR) 25 MG tablet Take 1 tablet (25 mg total) by mouth daily. 90 tablet 3   No current facility-administered medications for this visit.   Allergies:  Benzonatate, Codeine, Doxycycline, Lisinopril, and Metformin and related   ROS: No orthopnea or PND.  Physical Exam: VS:  BP (!) 144/78    Pulse 74    Ht 5\' 3"  (  1.6 m)    Wt 159 lb (72.1 kg)    SpO2 98%    BMI 28.17 kg/m , BMI Body mass index is 28.17 kg/m.  Wt Readings from Last 3 Encounters:  04/14/21 159 lb (72.1 kg)  11/22/20 159 lb 9.6 oz (72.4 kg)  10/11/20 162 lb 12.8 oz (73.8 kg)    General: Patient appears comfortable at rest. HEENT: Conjunctiva and lids normal, wearing a mask. Neck: Supple, no elevated JVP or carotid bruits, no thyromegaly. Lungs: Clear to auscultation, nonlabored breathing at rest. Cardiac: Regular rate and rhythm, no S3 or significant systolic murmur, no pericardial rub. Extremities: No pitting edema.  ECG:  An ECG dated 10/11/2020 was personally reviewed today and demonstrated:  Atrial paced rhythm, QTc 458 ms.  Recent Labwork:  December 2022: Hemoglobin 15.8, platelets 204, hemoglobin 4.3, BUN 19, creatinine 0.88, AST 19, ALT 16  Other Studies  Reviewed Today:  Echocardiogram 09/17/2017: Study Conclusions   - Left ventricle: The cavity size was normal. Wall thickness was   increased in a pattern of moderate LVH. Systolic function was   normal. The estimated ejection fraction was in the range of 60%   to 65%. Wall motion was normal; there were no regional wall   motion abnormalities. The study is not technically sufficient to   allow evaluation of LV diastolic function. - Aortic valve: Valve area (VTI): 3.43 cm^2. Valve area (Vmax):   3.06 cm^2.  Assessment and Plan:  1.  Paroxysmal atrial fibrillation with CHA2DS2-VASc score of 6.  She continues to do well with low rhythm burden.  Continue Tikosyn, Cardizem CD, and Xarelto.  2.  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  Keep follow-up with [name].  3.  CAD status post BMS to the circumflex in 2008.  She is doing well without active angina.  Currently on Jardiance, losartan, and Pravachol.  Medication Adjustments/Labs and Tests Ordered: Current medicines are reviewed at length with the patient today.  Concerns regarding medicines are outlined above.   Tests Ordered: No orders of the defined types were placed in this encounter.   Medication Changes: No orders of the defined types were placed in this encounter.   Disposition:  Follow up  6 months.  Signed, Satira Sark, MD, [facility] 04/14/2021 3:06 PM    [facility] [place], [place], [place] [address] Phone: [phone]; Fax: [phone]

## 2021-04-15 ENCOUNTER — Ambulatory Visit: Payer: Medicare Other | Admitting: Cardiology

## 2021-05-16 DIAGNOSIS — I10 Essential (primary) hypertension: Secondary | ICD-10-CM | POA: Diagnosis not present

## 2021-05-16 DIAGNOSIS — E1165 Type 2 diabetes mellitus with hyperglycemia: Secondary | ICD-10-CM | POA: Diagnosis not present

## 2021-06-24 ENCOUNTER — Other Ambulatory Visit: Payer: Self-pay | Admitting: Cardiology

## 2021-06-24 NOTE — Telephone Encounter (Signed)
Prescription refill request for Xarelto received.  ?Indication: PAF ?Last office visit: 04/14/21  Myles Gip MD ?Weight: 72.1kg  ?Age: 76 ?Scr: 0.88 on 01/22/21 ?CrCl: 59.97 ? ?Based on above findings Xarelto '20mg'$  daily is the appropriate dose.  Refill approved. ? ?

## 2021-06-25 DIAGNOSIS — R21 Rash and other nonspecific skin eruption: Secondary | ICD-10-CM | POA: Diagnosis not present

## 2021-06-27 DIAGNOSIS — I10 Essential (primary) hypertension: Secondary | ICD-10-CM | POA: Diagnosis not present

## 2021-06-27 DIAGNOSIS — I480 Paroxysmal atrial fibrillation: Secondary | ICD-10-CM | POA: Diagnosis not present

## 2021-06-27 DIAGNOSIS — E042 Nontoxic multinodular goiter: Secondary | ICD-10-CM | POA: Diagnosis not present

## 2021-06-27 DIAGNOSIS — R609 Edema, unspecified: Secondary | ICD-10-CM | POA: Diagnosis not present

## 2021-06-27 DIAGNOSIS — E7849 Other hyperlipidemia: Secondary | ICD-10-CM | POA: Diagnosis not present

## 2021-06-27 DIAGNOSIS — E1165 Type 2 diabetes mellitus with hyperglycemia: Secondary | ICD-10-CM | POA: Diagnosis not present

## 2021-06-27 DIAGNOSIS — K649 Unspecified hemorrhoids: Secondary | ICD-10-CM | POA: Diagnosis not present

## 2021-07-07 ENCOUNTER — Ambulatory Visit (INDEPENDENT_AMBULATORY_CARE_PROVIDER_SITE_OTHER): Payer: Medicare Other

## 2021-07-07 DIAGNOSIS — I495 Sick sinus syndrome: Secondary | ICD-10-CM | POA: Diagnosis not present

## 2021-07-08 LAB — CUP PACEART REMOTE DEVICE CHECK
Battery Impedance: 6857 Ohm
Battery Remaining Longevity: 1 mo — CL
Battery Voltage: 2.61 V
Brady Statistic AP VP Percent: 0 %
Brady Statistic AP VS Percent: 80 %
Brady Statistic AS VP Percent: 0 %
Brady Statistic AS VS Percent: 19 %
Date Time Interrogation Session: 20230522101205
Implantable Lead Implant Date: 20110510
Implantable Lead Implant Date: 20110510
Implantable Lead Location: 753859
Implantable Lead Location: 753860
Implantable Lead Model: 5076
Implantable Lead Model: 5092
Implantable Pulse Generator Implant Date: 20110510
Lead Channel Impedance Value: 496 Ohm
Lead Channel Impedance Value: 597 Ohm
Lead Channel Pacing Threshold Amplitude: 0.5 V
Lead Channel Pacing Threshold Amplitude: 1.125 V
Lead Channel Pacing Threshold Pulse Width: 0.4 ms
Lead Channel Pacing Threshold Pulse Width: 0.4 ms
Lead Channel Setting Pacing Amplitude: 2 V
Lead Channel Setting Pacing Amplitude: 2.5 V
Lead Channel Setting Pacing Pulse Width: 0.4 ms
Lead Channel Setting Sensing Sensitivity: 2.8 mV

## 2021-07-09 ENCOUNTER — Telehealth: Payer: Self-pay

## 2021-07-09 NOTE — Telephone Encounter (Signed)
Scheduled remote reviewed. Normal device function.   14 AHR, markers suggest AF, longest duration 85mn 59sec, overall controllled ventricular rates Burden 1.3%, Xarelto, Tikosyn, Diltiazem Battery estimated <173mo2/20 remote battery 10101mooute to triage  [facility]

## 2021-07-09 NOTE — Telephone Encounter (Signed)
Pt aware of appointment 

## 2021-07-23 DIAGNOSIS — Z7901 Long term (current) use of anticoagulants: Secondary | ICD-10-CM | POA: Diagnosis not present

## 2021-07-23 DIAGNOSIS — D751 Secondary polycythemia: Secondary | ICD-10-CM | POA: Diagnosis not present

## 2021-07-23 NOTE — Progress Notes (Signed)
Remote pacemaker transmission.   

## 2021-07-25 ENCOUNTER — Encounter: Payer: Self-pay | Admitting: Internal Medicine

## 2021-07-25 ENCOUNTER — Ambulatory Visit (INDEPENDENT_AMBULATORY_CARE_PROVIDER_SITE_OTHER): Payer: Medicare Other | Admitting: Internal Medicine

## 2021-07-25 VITALS — BP 127/80 | HR 74 | Ht 63.0 in | Wt 158.0 lb

## 2021-07-25 DIAGNOSIS — I480 Paroxysmal atrial fibrillation: Secondary | ICD-10-CM | POA: Diagnosis not present

## 2021-07-25 DIAGNOSIS — I25119 Atherosclerotic heart disease of native coronary artery with unspecified angina pectoris: Secondary | ICD-10-CM

## 2021-07-25 DIAGNOSIS — I495 Sick sinus syndrome: Secondary | ICD-10-CM

## 2021-07-25 DIAGNOSIS — I10 Essential (primary) hypertension: Secondary | ICD-10-CM | POA: Diagnosis not present

## 2021-07-25 DIAGNOSIS — R001 Bradycardia, unspecified: Secondary | ICD-10-CM

## 2021-07-25 LAB — CUP PACEART INCLINIC DEVICE CHECK
Battery Impedance: 7454 Ohm
Battery Remaining Longevity: 1 mo — CL
Battery Voltage: 2.6 V
Brady Statistic AP VP Percent: 0 %
Brady Statistic AP VS Percent: 80 %
Brady Statistic AS VP Percent: 0 %
Brady Statistic AS VS Percent: 20 %
Date Time Interrogation Session: 20230609155638
Implantable Lead Implant Date: 20110510
Implantable Lead Implant Date: 20110510
Implantable Lead Location: 753859
Implantable Lead Location: 753860
Implantable Lead Model: 5076
Implantable Lead Model: 5092
Implantable Pulse Generator Implant Date: 20110510
Lead Channel Impedance Value: 485 Ohm
Lead Channel Impedance Value: 546 Ohm
Lead Channel Pacing Threshold Amplitude: 0.5 V
Lead Channel Pacing Threshold Amplitude: 0.5 V
Lead Channel Pacing Threshold Amplitude: 1.25 V
Lead Channel Pacing Threshold Amplitude: 1.25 V
Lead Channel Pacing Threshold Pulse Width: 0.4 ms
Lead Channel Pacing Threshold Pulse Width: 0.4 ms
Lead Channel Pacing Threshold Pulse Width: 0.4 ms
Lead Channel Pacing Threshold Pulse Width: 0.4 ms
Lead Channel Sensing Intrinsic Amplitude: 2 mV
Lead Channel Sensing Intrinsic Amplitude: 8 mV
Lead Channel Setting Pacing Amplitude: 2 V
Lead Channel Setting Pacing Amplitude: 2.5 V
Lead Channel Setting Pacing Pulse Width: 0.4 ms
Lead Channel Setting Sensing Sensitivity: 2.8 mV

## 2021-07-25 NOTE — Patient Instructions (Signed)
Medication Instructions:  Your physician recommends that you continue on your current medications as directed. Please refer to the Current Medication list given to you today.  *If you need a refill on your cardiac medications before your next appointment, please call your pharmacy*   Lab Work: None ordered.  If you have labs (blood work) drawn today and your tests are completely normal, you will receive your results only by: [place] (if you have MyChart) OR A paper copy in the mail If you have any lab test that is abnormal or we need to change your treatment, we will call you to review the results.   Testing/Procedures: None ordered.    Follow-Up: At [facility], you and your health needs are our priority.  As part of our continuing mission to provide you with exceptional heart care, we have created designated Provider Care Teams.  These Care Teams include your primary Cardiologist (physician) and Advanced Practice Providers (APPs -  Physician Assistants and Nurse Practitioners) who all work together to provide you with the care you need, when you need it.  We recommend signing up for the patient portal called "MyChart".  Sign up information is provided on this After Visit Summary.  MyChart is used to connect with patients for Virtual Visits (Telemedicine).  Patients are able to view lab/test results, encounter notes, upcoming appointments, etc.  Non-urgent messages can be sent to your provider as well.   To learn more about what you can do with MyChart, go to [email or web address].    Your next appointment:   6 months with Dr Rayann Heman    Important Information About Sugar

## 2021-07-25 NOTE — Progress Notes (Signed)
PCP: Curlene Labrum, MD Primary Cardiologist: Dr Domenic Polite Primary EP:  Dr Rayann Heman  [name] [name] is a 76 y.o. female who presents today for routine electrophysiology followup.  Since last being seen in our clinic, the patient reports doing very well.  Today, she denies symptoms of palpitations, chest pain, shortness of breath,  lower extremity edema, dizziness, presyncope, or syncope.  The patient is otherwise without complaint today.   Past Medical History:  Diagnosis Date   Atrial fibrillation [facility])    Atrial flutter ([place])    Coronary atherosclerosis of native coronary artery    BMS to circumflex 9/08   HTN (hypertension)    Hyperlipidemia    Multiple thyroid nodules    Tachy-brady syndrome (HCC)    PPM - Medtronic   Type 2 diabetes mellitus ([place])    Urticaria    Past Surgical History:  Procedure Laterality Date   BREAST LUMPECTOMY     COLONOSCOPY  2014    Last colonoscopy 2014 by Dr. Britta Mccreedy. Internal hemorrhoids, sigmoid diverticulosis, multiple sessile polyps, tubular adenomas.    PACEMAKER PLACEMENT     Medtronic   ROTATOR CUFF REPAIR     VENTRAL HERNIA REPAIR     VESICOVAGINAL FISTULA CLOSURE W/ TAH      ROS- all systems are reviewed and negative except as per HPI above  Current Outpatient Medications  Medication Sig Dispense Refill   Cholecalciferol (D-3-5) 125 MCG (5000 UT) capsule Take 5,000 Units by mouth daily.     DILT-XR 120 MG 24 hr capsule TAKE 2 CAPSULES BY MOUTH  DAILY IN THE MORNING 180 capsule 3   DILT-XR 180 MG 24 hr capsule TAKE 1 CAPSULE BY MOUTH AT  BEDTIME 90 capsule 3   dofetilide (TIKOSYN) 500 MCG capsule TAKE 1 CAPSULE BY MOUTH  TWICE DAILY 180 capsule 3   JARDIANCE 25 MG TABS tablet Take 25 mg by mouth daily.     losartan (COZAAR) 25 MG tablet Take 1 tablet (25 mg total) by mouth daily. 90 tablet 3   MOUNJARO 2.5 MG/0.5ML Pen Inject 0.5 mLs into the skin once a week.     MOUNJARO 7.5 MG/0.5ML Pen Inject 7.5 mg into the skin once a week.      nitroGLYCERIN (NITROSTAT) 0.4 MG SL tablet Place 1 tablet (0.4 mg total) under the tongue every 5 (five) minutes x 3 doses as needed for chest pain (if no relief after 3 rd dose, proceed to ED for an evaluation). 75 tablet 3   potassium chloride SA (KLOR-CON) 20 MEQ tablet TAKE 1 TABLET BY MOUTH  EVERY OTHER DAY 45 tablet 3   pravastatin (PRAVACHOL) 40 MG tablet TAKE 1 TABLET BY MOUTH IN  THE EVENING 90 tablet 1   rivaroxaban (XARELTO) 20 MG TABS tablet TAKE 1 TABLET BY MOUTH IN THE  EVENING 100 tablet 3   No current facility-administered medications for this visit.    Physical Exam: Vitals:   07/25/21 1316  BP: 127/80  Pulse: 74  SpO2: 95%  Weight: 158 lb (71.7 kg)  Height: '5\' 3"'$  (1.6 m)    GEN- The patient is well appearing, alert and oriented x 3 today.   Head- normocephalic, atraumatic Eyes-  Sclera clear, conjunctiva pink Ears- hearing intact Oropharynx- clear Lungs- Clear to ausculation bilaterally, normal work of breathing Chest- pacemaker pocket is well healed Heart- Regular rate and rhythm, no murmurs, rubs or gallops, PMI not laterally displaced GI- soft, NT, ND, + BS Extremities- no clubbing,  cyanosis, or edema  Pacemaker interrogation- reviewed in detail today,  See PACEART report  ekg tracing ordered today is personally reviewed and shows A pacing, PR 270 msec,  stable qtc  Assessment and Plan:  1. Symptomatic sinus bradycardia  Normal pacemaker function See Pace Art report No changes today she is not device dependant today Approaching ERI.   Risks, benefits, and alternatives to PPM pulse generator replacement were discussed in detail today.  The patient understands that risks include but are not limited to bleeding, infection, pneumothorax, perforation, tamponade, vascular damage, renal failure, MI, stroke, death, damage to his existing leads, and lead dislodgement and wishes to proceed once ERI Once ERI, ok to schedule the procedure with me.  Hold xarelto  24 hours prior to the procedure.  2. Paroxysmal atrial fibrillation Well controlled with tikosyn Qt is stable Continue xarelto for chads2vasc score of 6 Labs 07/23/21 reviewed  3. CAD No ischemic symptoms  4. HTN Stable No change required today   Risks, benefits and potential toxicities for medications prescribed and/or refilled reviewed with patient today.   Return in 6 months if she does not reach ERI in the interim.  Thompson Grayer MD, [facility] 07/25/2021 1:26 PM

## 2021-07-31 DIAGNOSIS — G4730 Sleep apnea, unspecified: Secondary | ICD-10-CM | POA: Diagnosis not present

## 2021-07-31 DIAGNOSIS — I10 Essential (primary) hypertension: Secondary | ICD-10-CM | POA: Diagnosis not present

## 2021-07-31 DIAGNOSIS — D751 Secondary polycythemia: Secondary | ICD-10-CM | POA: Diagnosis not present

## 2021-07-31 DIAGNOSIS — Z950 Presence of cardiac pacemaker: Secondary | ICD-10-CM | POA: Diagnosis not present

## 2021-07-31 DIAGNOSIS — I2510 Atherosclerotic heart disease of native coronary artery without angina pectoris: Secondary | ICD-10-CM | POA: Diagnosis not present

## 2021-09-02 ENCOUNTER — Other Ambulatory Visit: Payer: Self-pay

## 2021-09-02 MED ORDER — PRAVASTATIN SODIUM 40 MG PO TABS: 40.0000 mg | ORAL_TABLET | Freq: Every evening | ORAL | 3 refills | Status: DC

## 2021-09-02 MED ORDER — DILTIAZEM HCL ER 120 MG PO CP24: ORAL_CAPSULE | ORAL | 3 refills | Status: DC

## 2021-09-02 MED ORDER — DILTIAZEM HCL ER 180 MG PO CP24: 180.0000 mg | ORAL_CAPSULE | Freq: Every day | ORAL | 3 refills | Status: DC

## 2021-09-05 ENCOUNTER — Telehealth: Payer: Self-pay

## 2021-09-05 ENCOUNTER — Ambulatory Visit (INDEPENDENT_AMBULATORY_CARE_PROVIDER_SITE_OTHER): Payer: Medicare Other

## 2021-09-05 DIAGNOSIS — I495 Sick sinus syndrome: Secondary | ICD-10-CM

## 2021-09-05 LAB — CUP PACEART REMOTE DEVICE CHECK
Battery Impedance: 9656 Ohm
Battery Voltage: 2.57 V
Brady Statistic RV Percent Paced: 5 %
Date Time Interrogation Session: 20230721143230
Implantable Lead Implant Date: 20110510
Implantable Lead Implant Date: 20110510
Implantable Lead Location: 753859
Implantable Lead Location: 753860
Implantable Lead Model: 5076
Implantable Lead Model: 5092
Implantable Pulse Generator Implant Date: 20110510
Lead Channel Impedance Value: 572 Ohm
Lead Channel Impedance Value: 67 Ohm
Lead Channel Setting Pacing Amplitude: 2.75 V
Lead Channel Setting Pacing Pulse Width: 0.4 ms
Lead Channel Setting Sensing Sensitivity: 2 mV

## 2021-09-05 NOTE — Telephone Encounter (Signed)
Scheduled remote reviewed.   Battery triggered ERI on 07/28/21.  Clinic notified.  No episodes recorded.  VP 5.1%.

## 2021-09-10 ENCOUNTER — Telehealth: Payer: Self-pay

## 2021-09-10 NOTE — Telephone Encounter (Signed)
The patient agreed to come see Dr. Lovena Le in [place] on 09/18/2021 at 4:15 pm.

## 2021-09-10 NOTE — Telephone Encounter (Signed)
Unsuccessful telephone encounter to patient to discuss PPM at [facility]. Patient previously advised that Dr. Thompson Grayer would perform procedure however he is no longer scheduling implants or gen changes. Patient will be offered in clinic appointment with another EP provider to discuss or provided the option to go ahead and schedule procedure with Dr. Lovena Le. [employer] compliant VM message left requesting call back to [phone].

## 2021-09-11 DIAGNOSIS — D751 Secondary polycythemia: Secondary | ICD-10-CM | POA: Diagnosis not present

## 2021-09-12 DIAGNOSIS — Z5189 Encounter for other specified aftercare: Secondary | ICD-10-CM | POA: Diagnosis not present

## 2021-09-18 ENCOUNTER — Encounter: Payer: Self-pay | Admitting: Internal Medicine

## 2021-09-18 ENCOUNTER — Ambulatory Visit (INDEPENDENT_AMBULATORY_CARE_PROVIDER_SITE_OTHER): Payer: Medicare Other | Admitting: Internal Medicine

## 2021-09-18 VITALS — BP 116/62 | HR 65 | Ht 63.0 in | Wt 161.0 lb

## 2021-09-18 DIAGNOSIS — I480 Paroxysmal atrial fibrillation: Secondary | ICD-10-CM | POA: Diagnosis not present

## 2021-09-18 NOTE — Progress Notes (Signed)
HPI [name] [name] returns today for followup. She is a pleasant 76 yo woman with symptomatic tachy-brady syndrome s/p PPM by Dr. Greggory Brandy over 11 years ago. The patient has PAF. She has reached ERI on her PPM. She has rare palpitations. She has been placed on dofetilide and her atrial fib has been fairly well controlled.  Allergies  Allergen Reactions   Benzonatate Cough   Codeine Nausea And Vomiting   Doxycycline Itching   Lisinopril Cough   Metformin And Related Rash     Current Outpatient Medications  Medication Sig Dispense Refill   Cholecalciferol (D-3-5) 125 MCG (5000 UT) capsule Take 5,000 Units by mouth daily.     diltiazem (DILT-XR) 120 MG 24 hr capsule TAKE 2 CAPSULES BY MOUTH  DAILY IN THE MORNING 180 capsule 3   diltiazem (DILT-XR) 180 MG 24 hr capsule Take 1 capsule (180 mg total) by mouth at bedtime. 90 capsule 3   dofetilide (TIKOSYN) 500 MCG capsule TAKE 1 CAPSULE BY MOUTH  TWICE DAILY 180 capsule 3   JARDIANCE 25 MG TABS tablet Take 25 mg by mouth daily.     losartan (COZAAR) 25 MG tablet Take 1 tablet (25 mg total) by mouth daily. 90 tablet 3   MOUNJARO 7.5 MG/0.5ML Pen Inject 7.5 mg into the skin once a week.     nitroGLYCERIN (NITROSTAT) 0.4 MG SL tablet Place 1 tablet (0.4 mg total) under the tongue every 5 (five) minutes x 3 doses as needed for chest pain (if no relief after 3 rd dose, proceed to ED for an evaluation). 75 tablet 3   potassium chloride SA (KLOR-CON) 20 MEQ tablet TAKE 1 TABLET BY MOUTH  EVERY OTHER DAY 45 tablet 3   pravastatin (PRAVACHOL) 40 MG tablet Take 1 tablet (40 mg total) by mouth every evening. 90 tablet 3   rivaroxaban (XARELTO) 20 MG TABS tablet TAKE 1 TABLET BY MOUTH IN THE  EVENING 100 tablet 3   No current facility-administered medications for this visit.     Past Medical History:  Diagnosis Date   Atrial fibrillation [facility])    Atrial flutter ([place])    Coronary atherosclerosis of native coronary artery    BMS to circumflex 9/08    HTN (hypertension)    Hyperlipidemia    Multiple thyroid nodules    Tachy-brady syndrome (HCC)    PPM - Medtronic   Type 2 diabetes mellitus (HCC)    Urticaria     ROS:   All systems reviewed and negative except as noted in the HPI.   Past Surgical History:  Procedure Laterality Date   BREAST LUMPECTOMY     COLONOSCOPY  2014    Last colonoscopy 2014 by Dr. Britta Mccreedy. Internal hemorrhoids, sigmoid diverticulosis, multiple sessile polyps, tubular adenomas.    PACEMAKER PLACEMENT     Medtronic   ROTATOR CUFF REPAIR     VENTRAL HERNIA REPAIR     VESICOVAGINAL FISTULA CLOSURE W/ TAH       Family History  Problem Relation Age of Onset   Heart disease Father    Heart disease Other        one sibling   Colon cancer Neg Hx    Colon polyps Neg Hx      Social History   Socioeconomic History   Marital status: Married    Spouse name: Not on file   Number of children: Not on file   Years of education: Not on file   Highest education  level: Not on file  Occupational History   Occupation: retired    Comment: [employer]  Tobacco Use   Smoking status: Never   Smokeless tobacco: Never  Vaping Use   Vaping Use: Never used  Substance and Sexual Activity   Alcohol use: Never    Alcohol/week: 0.0 standard drinks of alcohol   Drug use: Never   Sexual activity: Not on file  Other Topics Concern   Not on file  Social History Narrative   Not on file   Social Determinants of Health   Financial Resource Strain: Not on file  Food Insecurity: Not on file  Transportation Needs: Not on file  Physical Activity: Not on file  Stress: Not on file  Social Connections: Not on file  Intimate Partner Violence: Not on file     BP 116/62   Pulse 65   Ht '5\' 3"'$  (1.6 m)   Wt 161 lb (73 kg)   SpO2 94%   BMI 28.52 kg/m   Physical Exam:  Well appearing NAD HEENT: Unremarkable Neck:  No JVD, no thyromegally Lymphatics:  No adenopathy Back:  No CVA tenderness Lungs:  Clear with no  wheezes HEART:  Regular rate rhythm, no murmurs, no rubs, no clicks Abd:  soft, positive bowel sounds, no organomegally, no rebound, no guarding Ext:  2 plus pulses, no edema, no cyanosis, no clubbing Skin:  No rashes no nodules Neuro:  CN II through XII intact, motor grossly intact  EKG- NSR with AV pacing and P synchronous ventricular pacing  DEVICE  Normal device function.  See PaceArt for details.   Assess/Plan:  Sinus node dysfunction - she is asymptomatic s/p PPM insertion. She is atrial pacing about 80% of the time. PAF - she will continue dofetilide. She is out of rhythm about 1% of the time.  CAD - she denies anginal symptoms. HTN - her bp is well controlled. No change in her meds.  Teodora Baumgarten,MD

## 2021-09-18 NOTE — H&P (View-Only) (Signed)
HPI [name] [name] returns today for followup. She is a pleasant 76 yo woman with symptomatic tachy-brady syndrome s/p PPM by Dr. Greggory Brandy over 11 years ago. The patient has PAF. She has reached ERI on her PPM. She has rare palpitations. She has been placed on dofetilide and her atrial fib has been fairly well controlled.  Allergies  Allergen Reactions   Benzonatate Cough   Codeine Nausea And Vomiting   Doxycycline Itching   Lisinopril Cough   Metformin And Related Rash     Current Outpatient Medications  Medication Sig Dispense Refill   Cholecalciferol (D-3-5) 125 MCG (5000 UT) capsule Take 5,000 Units by mouth daily.     diltiazem (DILT-XR) 120 MG 24 hr capsule TAKE 2 CAPSULES BY MOUTH  DAILY IN THE MORNING 180 capsule 3   diltiazem (DILT-XR) 180 MG 24 hr capsule Take 1 capsule (180 mg total) by mouth at bedtime. 90 capsule 3   dofetilide (TIKOSYN) 500 MCG capsule TAKE 1 CAPSULE BY MOUTH  TWICE DAILY 180 capsule 3   JARDIANCE 25 MG TABS tablet Take 25 mg by mouth daily.     losartan (COZAAR) 25 MG tablet Take 1 tablet (25 mg total) by mouth daily. 90 tablet 3   MOUNJARO 7.5 MG/0.5ML Pen Inject 7.5 mg into the skin once a week.     nitroGLYCERIN (NITROSTAT) 0.4 MG SL tablet Place 1 tablet (0.4 mg total) under the tongue every 5 (five) minutes x 3 doses as needed for chest pain (if no relief after 3 rd dose, proceed to ED for an evaluation). 75 tablet 3   potassium chloride SA (KLOR-CON) 20 MEQ tablet TAKE 1 TABLET BY MOUTH  EVERY OTHER DAY 45 tablet 3   pravastatin (PRAVACHOL) 40 MG tablet Take 1 tablet (40 mg total) by mouth every evening. 90 tablet 3   rivaroxaban (XARELTO) 20 MG TABS tablet TAKE 1 TABLET BY MOUTH IN THE  EVENING 100 tablet 3   No current facility-administered medications for this visit.     Past Medical History:  Diagnosis Date   Atrial fibrillation [facility])    Atrial flutter ([place])    Coronary atherosclerosis of native coronary artery    BMS to circumflex 9/08    HTN (hypertension)    Hyperlipidemia    Multiple thyroid nodules    Tachy-brady syndrome (HCC)    PPM - Medtronic   Type 2 diabetes mellitus (HCC)    Urticaria     ROS:   All systems reviewed and negative except as noted in the HPI.   Past Surgical History:  Procedure Laterality Date   BREAST LUMPECTOMY     COLONOSCOPY  2014    Last colonoscopy 2014 by Dr. Britta Mccreedy. Internal hemorrhoids, sigmoid diverticulosis, multiple sessile polyps, tubular adenomas.    PACEMAKER PLACEMENT     Medtronic   ROTATOR CUFF REPAIR     VENTRAL HERNIA REPAIR     VESICOVAGINAL FISTULA CLOSURE W/ TAH       Family History  Problem Relation Age of Onset   Heart disease Father    Heart disease Other        one sibling   Colon cancer Neg Hx    Colon polyps Neg Hx      Social History   Socioeconomic History   Marital status: Married    Spouse name: Not on file   Number of children: Not on file   Years of education: Not on file   Highest education  level: Not on file  Occupational History   Occupation: retired    Comment: [employer]  Tobacco Use   Smoking status: Never   Smokeless tobacco: Never  Vaping Use   Vaping Use: Never used  Substance and Sexual Activity   Alcohol use: Never    Alcohol/week: 0.0 standard drinks of alcohol   Drug use: Never   Sexual activity: Not on file  Other Topics Concern   Not on file  Social History Narrative   Not on file   Social Determinants of Health   Financial Resource Strain: Not on file  Food Insecurity: Not on file  Transportation Needs: Not on file  Physical Activity: Not on file  Stress: Not on file  Social Connections: Not on file  Intimate Partner Violence: Not on file     BP 116/62   Pulse 65   Ht '5\' 3"'$  (1.6 m)   Wt 161 lb (73 kg)   SpO2 94%   BMI 28.52 kg/m   Physical Exam:  Well appearing NAD HEENT: Unremarkable Neck:  No JVD, no thyromegally Lymphatics:  No adenopathy Back:  No CVA tenderness Lungs:  Clear with no  wheezes HEART:  Regular rate rhythm, no murmurs, no rubs, no clicks Abd:  soft, positive bowel sounds, no organomegally, no rebound, no guarding Ext:  2 plus pulses, no edema, no cyanosis, no clubbing Skin:  No rashes no nodules Neuro:  CN II through XII intact, motor grossly intact  EKG- NSR with AV pacing and P synchronous ventricular pacing  DEVICE  Normal device function.  See PaceArt for details.   Assess/Plan:  Sinus node dysfunction - she is asymptomatic s/p PPM insertion. She is atrial pacing about 80% of the time. PAF - she will continue dofetilide. She is out of rhythm about 1% of the time.  CAD - she denies anginal symptoms. HTN - her bp is well controlled. No change in her meds.  [name] Tavano,MD

## 2021-09-18 NOTE — Patient Instructions (Signed)
Medication Instructions:  Your physician recommends that you continue on your current medications as directed. Please refer to the Current Medication list given to you today.  *If you need a refill on your cardiac medications before your next appointment, please call your pharmacy*  Hold Xarelto for 2 days prior to procedure   Lab Work: Your physician recommends that you return for lab work in: [place], [facility]   If you have labs (blood work) drawn today and your tests are completely normal, you will receive your results only by: [place] (if you have [place]) OR A paper copy in the mail If you have any lab test that is abnormal or we need to change your treatment, we will call you to review the results.   Testing/Procedures: Your physician has recommended that you have a pacemaker inserted. A pacemaker is a small device that is placed under the skin of your chest or abdomen to help control abnormal heart rhythms. This device uses electrical pulses to prompt the heart to beat at a normal rate. Pacemakers are used to treat heart rhythms that are too slow. Wire (leads) are attached to the pacemaker that goes into the chambers of you heart. This is done in the hospital and usually requires and overnight stay. Please see the instruction sheet given to you today for more information.    Follow-Up: At [facility], you and your health needs are our priority.  As part of our continuing mission to provide you with exceptional heart care, we have created designated Provider Care Teams.  These Care Teams include your primary Cardiologist (physician) and Advanced Practice Providers (APPs -  Physician Assistants and Nurse Practitioners) who all work together to provide you with the care you need, when you need it.  We recommend signing up for the patient portal called "MyChart".  Sign up information is provided on this After Visit Summary.  MyChart is used to connect with patients for Virtual Visits  (Telemedicine).  Patients are able to view lab/test results, encounter notes, upcoming appointments, etc.  Non-urgent messages can be sent to your provider as well.   To learn more about what you can do with MyChart, go to [email or web address].    Your next appointment:   3 month(s)  The format for your next appointment:   In Person  Provider:   Cristopher Peru, MD    Other Instructions Thank you for choosing [facility]!     Implantable Device Instructions    [name] [name]  09/18/2021  You are scheduled for a PPM generator change on Friday, September 1 with Dr. Cristopher Peru.  1. Pre procedure Lab testing:  [facility]     2. Please arrive at the Main Entrance A at [facility]: [place], [place] [address] on September 1 at 12:00 PM (This time is two hours before your procedure to ensure your preparation). Free valet parking service is available. You will check in at ADMITTING. The support person will be asked to wait in the waiting room.  It is OK to have someone drop you off and come back when you are ready to be discharged.        Special note: Every effort is made to have your procedure done on time. Please understand that emergencies sometimes delay  scheduled procedures.  3.  No eating or drinking after midnight prior to procedure.     4.  Medication instructions:  On the morning of your procedure  hold your Xarelto (Rivaroxaban) for 2 day(s) prior to your procedure. Your last dose will be Tuesday, August 29, PM dose.   5.  The night before your procedure and the morning of your procedure scrub your neck/chest with CHG surgical scrub.  See instruction letter.  6. Plan to go home the same day, you will only stay overnight if medically necessary. 7.  You MUST have a responsible adult to drive you home. 8.   An adult MUST be with you the first 24 hours after you arrive home. 36..  Bring a current list of your medications, and the last  time and date medication taken. 10. Bring ID and current insurance cards. 11. .Please wear clothes that are easy to get on and off and wear slip-on shoes.    You will follow up with the [facility] 10-14 days after your procedure.  You will follow up with Dr. Cristopher Peru 91 days after your procedure.  These appointments will be made for you.   * If you have ANY questions after you get home, please call the office at [phone] or send a MyChart message.  FYI: For your safety, and to allow [place] to monitor your vital signs accurately during the surgery/procedure we request that if you have artificial nails, gel coating, SNS etc. Please have those removed prior to your surgery/procedure. Not having the nail coverings /polish removed may result in cancellation or delay of your surgery/procedure.    [place] - Preparing For Surgery    Before surgery, you can play an important role. Because skin is not sterile, your skin needs to be as free of germs as possible. You can reduce the number of germs on your skin by washing with CHG (chlorahexidine gluconate) Soap before surgery.  CHG is an antiseptic cleaner which kills germs and bonds with the skin to continue killing germs even after washing.  Please do not use if you have an allergy to CHG or antibacterial soaps.  If your skin becomes reddened/irritated stop using the CHG.   Do not shave (including legs and underarms) for at least 48 hours prior to first CHG shower.  It is OK to shave your face.  Please follow these instructions carefully:  1.  Shower the night before surgery and the morning of surgery with CHG.  2.  If you choose to wash your hair, wash your hair first as usual with your normal shampoo.  3.  After you shampoo, rinse your hair and body thoroughly to remove the shampoo.  4.  Use CHG as you would any other liquid soap.  You can apply CHG directly to the skin and wash gently with a clean washcloth. 5.  Apply the CHG  Soap to your body ONLY FROM THE NECK DOWN.  Do not use on open wounds or open sores.  Avoid contact with your eyes, ears, mouth and genitals (private parts).  Wash genitals (private parts) with your normal soap.  6.  Wash thoroughly, paying special attention to the area where your surgery will be performed.  7.  Thoroughly rinse your body with warm water from the neck down.   8.  DO NOT shower/wash with your normal soap after using and rinsing off the CHG soap.  9.  Pat yourself dry with a clean towel.           10.  Wear clean pajamas.           11.  Place clean  sheets on your bed the night of your first shower and do not sleep with pets.  Day of Surgery: Do not apply any deodorants/lotions.  Please wear clean clothes to the hospital/surgery center.   Important Information About Sugar

## 2021-09-19 ENCOUNTER — Telehealth: Payer: Self-pay | Admitting: Internal Medicine

## 2021-09-19 NOTE — Telephone Encounter (Signed)
Patient will eat a light breakfast before 8 am

## 2021-09-19 NOTE — Progress Notes (Signed)
Remote pacemaker transmission.   

## 2021-09-19 NOTE — Telephone Encounter (Signed)
Patient calling with concerns with no being able to eat prior to her procedure on 9/1. She is worry that her blood sugar will drop. Please advise

## 2021-09-26 NOTE — Telephone Encounter (Signed)
Attempted to contact Pt.  Phone line is busy.  Need to advise Pt her arrival time for her procedure has changed to 10:00 am.  She cannot have a light breakfast.

## 2021-09-26 NOTE — Telephone Encounter (Signed)
Called pt to notify of procedure time change and that she can not have breakfast that morning. Phone line busy X 2.

## 2021-09-29 ENCOUNTER — Telehealth: Payer: Self-pay | Admitting: Internal Medicine

## 2021-09-29 NOTE — Telephone Encounter (Signed)
Attempt to reach,line busy  Left message with son asking him to contact his mother and asked that she call us.

## 2021-09-29 NOTE — Telephone Encounter (Signed)
Need to advise Pt her arrival time for her procedure has changed to 10:00 am.   She cannot have a light breakfast.     Patient notified and verbalized message back to me.

## 2021-09-29 NOTE — Telephone Encounter (Signed)
Follow Up:     Patient is returning a call from today. 

## 2021-09-30 NOTE — Telephone Encounter (Signed)
Pt notified and voiced understanding 

## 2021-10-03 DIAGNOSIS — R5383 Other fatigue: Secondary | ICD-10-CM | POA: Diagnosis not present

## 2021-10-03 DIAGNOSIS — E1165 Type 2 diabetes mellitus with hyperglycemia: Secondary | ICD-10-CM | POA: Diagnosis not present

## 2021-10-03 DIAGNOSIS — E7849 Other hyperlipidemia: Secondary | ICD-10-CM | POA: Diagnosis not present

## 2021-10-03 DIAGNOSIS — I10 Essential (primary) hypertension: Secondary | ICD-10-CM | POA: Diagnosis not present

## 2021-10-06 ENCOUNTER — Ambulatory Visit (INDEPENDENT_AMBULATORY_CARE_PROVIDER_SITE_OTHER): Payer: Medicare Other

## 2021-10-06 DIAGNOSIS — I480 Paroxysmal atrial fibrillation: Secondary | ICD-10-CM

## 2021-10-07 LAB — CUP PACEART REMOTE DEVICE CHECK
Battery Impedance: 11280 Ohm
Battery Voltage: 2.56 V
Brady Statistic RV Percent Paced: 5 %
Date Time Interrogation Session: 20230822111616
Implantable Lead Implant Date: 20110510
Implantable Lead Implant Date: 20110510
Implantable Lead Location: 753859
Implantable Lead Location: 753860
Implantable Lead Model: 5076
Implantable Lead Model: 5092
Implantable Pulse Generator Implant Date: 20110510
Lead Channel Impedance Value: 529 Ohm
Lead Channel Impedance Value: 67 Ohm
Lead Channel Setting Pacing Amplitude: 2.75 V
Lead Channel Setting Pacing Pulse Width: 0.4 ms
Lead Channel Setting Sensing Sensitivity: 2 mV

## 2021-10-08 DIAGNOSIS — I10 Essential (primary) hypertension: Secondary | ICD-10-CM | POA: Diagnosis not present

## 2021-10-08 DIAGNOSIS — E1165 Type 2 diabetes mellitus with hyperglycemia: Secondary | ICD-10-CM | POA: Diagnosis not present

## 2021-10-08 DIAGNOSIS — K649 Unspecified hemorrhoids: Secondary | ICD-10-CM | POA: Diagnosis not present

## 2021-10-08 DIAGNOSIS — R609 Edema, unspecified: Secondary | ICD-10-CM | POA: Diagnosis not present

## 2021-10-08 DIAGNOSIS — M545 Low back pain, unspecified: Secondary | ICD-10-CM | POA: Diagnosis not present

## 2021-10-08 DIAGNOSIS — E042 Nontoxic multinodular goiter: Secondary | ICD-10-CM | POA: Diagnosis not present

## 2021-10-08 DIAGNOSIS — I480 Paroxysmal atrial fibrillation: Secondary | ICD-10-CM | POA: Diagnosis not present

## 2021-10-08 DIAGNOSIS — E7849 Other hyperlipidemia: Secondary | ICD-10-CM | POA: Diagnosis not present

## 2021-10-16 NOTE — Pre-Procedure Instructions (Signed)
Attempted to call patient regarding procedure instructions for tomorrow.  Left voice mail on on the following items: Procedure time being moved to 2:00 Arrival time is now 1200, procedure is 2:00.   Nothing to eat or drink after midnight No meds AM of procedure Responsible person to drive you home and stay with you for 24 hrs Wash with special soap night before and morning of procedure

## 2021-10-17 ENCOUNTER — Encounter (HOSPITAL_COMMUNITY)
Admission: RE | Disposition: A | Discharge status: Home / Self Care | Payer: Medicare Other | Source: Home / Self Care | Attending: Internal Medicine

## 2021-10-17 ENCOUNTER — Ambulatory Visit (HOSPITAL_COMMUNITY)
Admission: RE | Admit: 2021-10-17 | Discharge: 2021-10-17 | Disposition: A | Discharge status: Home / Self Care | Payer: Medicare Other | Attending: Internal Medicine | Admitting: Internal Medicine

## 2021-10-17 DIAGNOSIS — I2510 Atherosclerotic heart disease of native coronary artery without angina pectoris: Secondary | ICD-10-CM | POA: Insufficient documentation

## 2021-10-17 DIAGNOSIS — I480 Paroxysmal atrial fibrillation: Secondary | ICD-10-CM | POA: Insufficient documentation

## 2021-10-17 DIAGNOSIS — Z79899 Other long term (current) drug therapy: Secondary | ICD-10-CM | POA: Insufficient documentation

## 2021-10-17 DIAGNOSIS — I10 Essential (primary) hypertension: Secondary | ICD-10-CM | POA: Insufficient documentation

## 2021-10-17 DIAGNOSIS — Z45010 Encounter for checking and testing of cardiac pacemaker pulse generator [battery]: Secondary | ICD-10-CM | POA: Insufficient documentation

## 2021-10-17 DIAGNOSIS — I495 Sick sinus syndrome: Secondary | ICD-10-CM | POA: Insufficient documentation

## 2021-10-17 DIAGNOSIS — Z950 Presence of cardiac pacemaker: Secondary | ICD-10-CM

## 2021-10-17 HISTORY — PX: PPM GENERATOR CHANGEOUT: EP1233

## 2021-10-17 LAB — CBC
HCT: 49.4 % — ABNORMAL HIGH (ref 36.0–46.0)
Hemoglobin: 16.7 g/dL — ABNORMAL HIGH (ref 12.0–15.0)
MCH: 31.1 pg (ref 26.0–34.0)
MCHC: 33.8 g/dL (ref 30.0–36.0)
MCV: 92 fL (ref 80.0–100.0)
Platelets: 238 10*3/uL (ref 150–400)
RBC: 5.37 MIL/uL — ABNORMAL HIGH (ref 3.87–5.11)
RDW: 12.6 % (ref 11.5–15.5)
WBC: 9 10*3/uL (ref 4.0–10.5)
nRBC: 0 % (ref 0.0–0.2)

## 2021-10-17 LAB — BASIC METABOLIC PANEL
Anion gap: 8 (ref 5–15)
BUN: 16 mg/dL (ref 8–23)
CO2: 24 mmol/L (ref 22–32)
Calcium: 9.9 mg/dL (ref 8.9–10.3)
Chloride: 109 mmol/L (ref 98–111)
Creatinine, Ser: 0.73 mg/dL (ref 0.44–1.00)
GFR, Estimated: >60 mL/min (ref >60)
Glucose, Bld: 133 mg/dL — ABNORMAL HIGH (ref 70–99)
Potassium: 4 mmol/L (ref 3.5–5.1)
Sodium: 141 mmol/L (ref 135–145)

## 2021-10-17 LAB — GLUCOSE, CAPILLARY
Glucose-Capillary: 131 mg/dL — ABNORMAL HIGH (ref 70–99)
Glucose-Capillary: 106 mg/dL — ABNORMAL HIGH (ref 70–99)

## 2021-10-17 SURGERY — PPM GENERATOR CHANGEOUT

## 2021-10-17 MED ORDER — LIDOCAINE HCL (PF) 1 % IJ SOLN
INTRAMUSCULAR | Status: DC | PRN
  Administered 2021-10-17: 60 mL

## 2021-10-17 MED ORDER — CHLORHEXIDINE GLUCONATE 4 % EX LIQD
4.0000 | Freq: Once | CUTANEOUS | Status: DC
  Filled 2021-10-17: qty 60

## 2021-10-17 MED ORDER — CEFAZOLIN SODIUM-DEXTROSE 2-4 GM/100ML-% IV SOLN
2.0000 g | INTRAVENOUS | Status: AC
  Administered 2021-10-17: 2 g via INTRAVENOUS

## 2021-10-17 MED ORDER — POVIDONE-IODINE 10 % EX SWAB
2.0000 | Freq: Once | CUTANEOUS | Status: AC
  Administered 2021-10-17: 2 via TOPICAL

## 2021-10-17 MED ORDER — SODIUM CHLORIDE 0.9 % IV SOLN
INTRAVENOUS | Status: AC
  Filled 2021-10-17: qty 2

## 2021-10-17 MED ORDER — LIDOCAINE HCL (PF) 1 % IJ SOLN
INTRAMUSCULAR | Status: AC
  Filled 2021-10-17: qty 60

## 2021-10-17 MED ORDER — ONDANSETRON HCL 4 MG/2ML IJ SOLN: 4.0000 mg | Freq: Four times a day (QID) | INTRAMUSCULAR | Status: DC | PRN

## 2021-10-17 MED ORDER — ACETAMINOPHEN 325 MG PO TABS: 325.0000 mg | ORAL_TABLET | ORAL | Status: DC | PRN

## 2021-10-17 MED ORDER — SODIUM CHLORIDE 0.9 % IV SOLN: INTRAVENOUS | Status: DC

## 2021-10-17 MED ORDER — SODIUM CHLORIDE 0.9 % IV SOLN
80.0000 mg | INTRAVENOUS | Status: AC
  Administered 2021-10-17: 80 mg

## 2021-10-17 MED ORDER — CEFAZOLIN SODIUM-DEXTROSE 2-4 GM/100ML-% IV SOLN
INTRAVENOUS | Status: AC
  Filled 2021-10-17: qty 100

## 2021-10-17 SURGICAL SUPPLY — 5 items
CABLE SURGICAL S-101-97-12 (CABLE) ×1 IMPLANT
IPG PACE AZUR XT DR MRI W1DR01 (Pacemaker) IMPLANT
PACE AZURE XT DR MRI W1DR01 (Pacemaker) ×1 IMPLANT
PAD DEFIB RADIO PHYSIO CONN (PAD) ×1 IMPLANT
TRAY PACEMAKER INSERTION (PACKS) ×1 IMPLANT

## 2021-10-17 NOTE — Discharge Instructions (Signed)

## 2021-10-17 NOTE — Interval H&P Note (Signed)
History and Physical Interval Note:  10/17/2021 12:51 PM  [name] [name]  has presented today for surgery, with the diagnosis of ERI.  The various methods of treatment have been discussed with the patient and family. After consideration of risks, benefits and other options for treatment, the patient has consented to  Procedure(s): PPM GENERATOR CHANGEOUT (N/A) as a surgical intervention.  The patient's history has been reviewed, patient examined, no change in status, stable for surgery.  I have reviewed the patient's chart and labs.  Questions were answered to the patient's satisfaction.     Cristopher Peru

## 2021-10-21 ENCOUNTER — Encounter (HOSPITAL_COMMUNITY): Payer: Self-pay | Admitting: Internal Medicine

## 2021-10-21 NOTE — Progress Notes (Unsigned)
Cardiology Office Note  Date: 10/22/2021   ID: [name] [name], DOB Jun 17, 1945, MRN [id number]  PCP:  Curlene Labrum, MD  Cardiologist:  Rozann Lesches, MD Electrophysiologist:  Thompson Grayer, MD   Chief Complaint  Patient presents with   Cardiac follow-up    History of Present Illness: [name] [name] is a 76 y.o. female last seen in February.  She is here for a routine visit.  Reports no palpitations or chest pain on current cardiac regimen.  She is now following with Dr. Lovena Le with history of tachycardia-bradycardia syndrome.  Just recently underwent placement of Medtronic dual-chamber pacemaker due to prior device being at [facility].  Wound check is pending.  I reviewed her medications which are noted below.  She reports compliance with therapy.  She was temporarily off Xarelto for generator change.  I also reviewed her recent lab work.  Past Medical History:  Diagnosis Date   Atrial fibrillation [facility])    Atrial flutter ([place])    Coronary atherosclerosis of native coronary artery    BMS to circumflex 9/08   HTN (hypertension)    Hyperlipidemia    Multiple thyroid nodules    Tachy-brady syndrome (HCC)    PPM - Medtronic   Type 2 diabetes mellitus ([place])    Urticaria     Past Surgical History:  Procedure Laterality Date   BREAST LUMPECTOMY     COLONOSCOPY  2014    Last colonoscopy 2014 by Dr. Britta Mccreedy. Internal hemorrhoids, sigmoid diverticulosis, multiple sessile polyps, tubular adenomas.    PACEMAKER PLACEMENT     Medtronic   PPM GENERATOR CHANGEOUT N/A 10/17/2021   Procedure: PPM GENERATOR CHANGEOUT;  Surgeon: Evans Lance, MD;  Location: [place] CV LAB;  Service: Cardiovascular;  Laterality: N/A;   ROTATOR CUFF REPAIR     VENTRAL HERNIA REPAIR     VESICOVAGINAL FISTULA CLOSURE W/ TAH      Current Outpatient Medications  Medication Sig Dispense Refill   Cholecalciferol (D-3-5) 125 MCG (5000 UT) capsule Take 5,000 Units by mouth daily.     cyanocobalamin  (VITAMIN B12) 1000 MCG tablet Take 1,000 mcg by mouth at bedtime.     diltiazem (DILT-XR) 120 MG 24 hr capsule TAKE 2 CAPSULES BY MOUTH  DAILY IN THE MORNING (Patient taking differently: Take 120 mg by mouth in the morning and at bedtime.) 180 capsule 3   diltiazem (DILT-XR) 180 MG 24 hr capsule Take 1 capsule (180 mg total) by mouth at bedtime. 90 capsule 3   dofetilide (TIKOSYN) 500 MCG capsule TAKE 1 CAPSULE BY MOUTH  TWICE DAILY 180 capsule 3   JARDIANCE 25 MG TABS tablet Take 25 mg by mouth daily.     losartan (COZAAR) 50 MG tablet Take 50 mg by mouth daily.     MOUNJARO 7.5 MG/0.5ML Pen Inject 7.5 mg into the skin every Wednesday.     nitroGLYCERIN (NITROSTAT) 0.4 MG SL tablet Place 1 tablet (0.4 mg total) under the tongue every 5 (five) minutes x 3 doses as needed for chest pain (if no relief after 3 rd dose, proceed to ED for an evaluation). 75 tablet 3   potassium chloride SA (KLOR-CON) 20 MEQ tablet TAKE 1 TABLET BY MOUTH  EVERY OTHER DAY 45 tablet 3   pravastatin (PRAVACHOL) 40 MG tablet Take 1 tablet (40 mg total) by mouth every evening. 90 tablet 3   rivaroxaban (XARELTO) 20 MG TABS tablet TAKE 1 TABLET BY MOUTH IN THE  EVENING 100 tablet  3   No current facility-administered medications for this visit.   Allergies:  Benzonatate, Codeine, Doxycycline, Lisinopril, and Metformin and related   ROS: No syncope.  Physical Exam: VS:  BP 120/62 (BP Location: Left Arm, Patient Position: Sitting, Cuff Size: Normal)   Pulse 74   Ht '5\' 3"'$  (1.6 m)   Wt 162 lb 12.8 oz (73.8 kg)   SpO2 95%   BMI 28.84 kg/m , BMI Body mass index is 28.84 kg/m.  Wt Readings from Last 3 Encounters:  10/22/21 162 lb 12.8 oz (73.8 kg)  10/17/21 159 lb (72.1 kg)  09/18/21 161 lb (73 kg)    General: Patient appears comfortable at rest. HEENT: Conjunctiva and lids normal. Neck: Supple, no elevated JVP or carotid bruits, no thyromegaly. Lungs: Clear to auscultation, nonlabored breathing at rest. Thorax:  Left upper chest pacer pocket with no induration, surrounding ecchymosis, Steri-Strips in place. Cardiac: Regular rate and rhythm, no S3 or significant systolic murmur, no pericardial rub. Extremities: No pitting edema.  ECG:  An ECG dated 09/18/2021 was personally reviewed today and demonstrated:  Dual chamber pacing.  Recent Labwork: 10/17/2021: BUN 16; Creatinine, Ser 0.73; Hemoglobin 16.7; Platelets 238; Potassium 4.0; Sodium 141   Other Studies Reviewed Today:  Echocardiogram 09/17/2017: Study Conclusions   - Left ventricle: The cavity size was normal. Wall thickness was   increased in a pattern of moderate LVH. Systolic function was   normal. The estimated ejection fraction was in the range of 60%   to 65%. Wall motion was normal; there were no regional wall   motion abnormalities. The study is not technically sufficient to   allow evaluation of LV diastolic function. - Aortic valve: Valve area (VTI): 3.43 cm^2. Valve area (Vmax):   3.06 cm^2.  Assessment and Plan:  1.  Paroxysmal atrial fibrillation with CHA2DS2-VASc score of 6.  She has done well in terms of rhythm control and continues on Tikosyn, Cardizem CD, and Xarelto.  I reviewed her recent lab work and ECG.  2.  Tachycardia-bradycardia syndrome status post recent Medtronic dual-chamber pacemaker generator change by Dr. Lovena Le with follow-up pending.  3.  CAD status post BMS to the circumflex in 2008.  She does not report any active angina.  Continue Jardiance, Pravachol, and as a nitroglycerin.  Medication Adjustments/Labs and Tests Ordered: Current medicines are reviewed at length with the patient today.  Concerns regarding medicines are outlined above.   Tests Ordered: No orders of the defined types were placed in this encounter.   Medication Changes: No orders of the defined types were placed in this encounter.   Disposition:  Follow up  6 months.  Signed, Satira Sark, MD, [facility] 10/22/2021 11:58 AM     [facility] [place], [place], [place] [address] Phone: [phone]; Fax: [phone]

## 2021-10-22 ENCOUNTER — Ambulatory Visit: Payer: Medicare Other | Attending: Cardiology | Admitting: Cardiology

## 2021-10-22 ENCOUNTER — Encounter: Payer: Self-pay | Admitting: Cardiology

## 2021-10-22 VITALS — BP 120/62 | HR 74 | Ht 63.0 in | Wt 162.8 lb

## 2021-10-22 DIAGNOSIS — I480 Paroxysmal atrial fibrillation: Secondary | ICD-10-CM

## 2021-10-22 DIAGNOSIS — I25119 Atherosclerotic heart disease of native coronary artery with unspecified angina pectoris: Secondary | ICD-10-CM

## 2021-10-22 DIAGNOSIS — I495 Sick sinus syndrome: Secondary | ICD-10-CM | POA: Diagnosis not present

## 2021-10-22 NOTE — Patient Instructions (Addendum)

## 2021-11-03 NOTE — Progress Notes (Signed)
Remote pacemaker transmission.   

## 2021-11-03 NOTE — Addendum Note (Signed)
Addended by: Douglass Rivers D on: 11/03/2021 10:16 AM   Modules accepted: Level of Service

## 2021-11-05 ENCOUNTER — Ambulatory Visit: Payer: Medicare Other | Attending: Cardiology

## 2021-11-05 DIAGNOSIS — I495 Sick sinus syndrome: Secondary | ICD-10-CM

## 2021-11-05 LAB — CUP PACEART INCLINIC DEVICE CHECK
Battery Remaining Longevity: 167 mo
Battery Voltage: 3.22 V
Brady Statistic AP VP Percent: 0.17 %
Brady Statistic AP VS Percent: 70.63 %
Brady Statistic AS VP Percent: 0.08 %
Brady Statistic AS VS Percent: 29.12 %
Brady Statistic RA Percent Paced: 67.04 %
Brady Statistic RV Percent Paced: 2.17 %
Date Time Interrogation Session: 20230920144700
Implantable Lead Implant Date: 20110510
Implantable Lead Implant Date: 20110510
Implantable Lead Location: 753859
Implantable Lead Location: 753860
Implantable Lead Model: 5076
Implantable Lead Model: 5092
Implantable Pulse Generator Implant Date: 20230901
Lead Channel Impedance Value: 361 Ohm
Lead Channel Impedance Value: 380 Ohm
Lead Channel Impedance Value: 399 Ohm
Lead Channel Impedance Value: 456 Ohm
Lead Channel Pacing Threshold Amplitude: 0.5 V
Lead Channel Pacing Threshold Amplitude: 1.375 V
Lead Channel Pacing Threshold Pulse Width: 0.4 ms
Lead Channel Pacing Threshold Pulse Width: 0.4 ms
Lead Channel Sensing Intrinsic Amplitude: 1.625 mV
Lead Channel Sensing Intrinsic Amplitude: 2 mV
Lead Channel Sensing Intrinsic Amplitude: 4 mV
Lead Channel Sensing Intrinsic Amplitude: 4.5 mV
Lead Channel Setting Pacing Amplitude: 1.5 V
Lead Channel Setting Pacing Amplitude: 2.75 V
Lead Channel Setting Pacing Pulse Width: 0.4 ms
Lead Channel Setting Sensing Sensitivity: 0.9 mV

## 2021-11-05 NOTE — Patient Instructions (Addendum)
After Your Pacemaker   Monitor your pacemaker site for redness, swelling, and drainage. Call the device clinic at [phone] if you experience these symptoms or fever/chills.  Your incision was closed with Steri-strips or staples:  You may shower 7 days after your procedure and wash your incision with soap and water. Avoid lotions, ointments, or perfumes over your incision until it is well-healed.  Wash your incision site well with antibacterial soap and warm/hot water 2 times a day and then leave the incision open to air.  You may use a hot tub or a pool after your wound check appointment if the incision is completely closed.   You may drive, unless driving has been restricted by your healthcare providers.  Remote monitoring is used to monitor your pacemaker from home. This monitoring is scheduled every 91 days by our office. It allows [place] to keep an eye on the functioning of your device to ensure it is working properly. You will routinely see your Electrophysiologist annually (more often if necessary).

## 2021-11-05 NOTE — Progress Notes (Signed)
Wound check appointment. Steri-strips removed. Wound without redness or edema. Small opening to distal incision.  Dr. Lovena Le assessed.  Advised to wash area 2 times daily with soap and hot water and continue to monitor. Normal device function. Thresholds, sensing, and impedances consistent with implant measurements. Histogram distribution appropriate for patient and level of activity. No mode switches or high ventricular rates noted. Patient educated about wound care. ROV in 3 months with [facility] EP.

## 2021-11-13 ENCOUNTER — Other Ambulatory Visit: Payer: Self-pay | Admitting: Internal Medicine

## 2021-11-21 ENCOUNTER — Encounter: Payer: Medicare Other | Admitting: Internal Medicine

## 2021-11-23 ENCOUNTER — Other Ambulatory Visit: Payer: Self-pay | Admitting: Cardiology

## 2021-12-08 DIAGNOSIS — D751 Secondary polycythemia: Secondary | ICD-10-CM | POA: Diagnosis not present

## 2021-12-30 ENCOUNTER — Ambulatory Visit: Payer: Medicare Other | Admitting: Internal Medicine

## 2022-01-15 DIAGNOSIS — E7849 Other hyperlipidemia: Secondary | ICD-10-CM | POA: Diagnosis not present

## 2022-01-15 DIAGNOSIS — E11628 Type 2 diabetes mellitus with other skin complications: Secondary | ICD-10-CM | POA: Diagnosis not present

## 2022-01-15 DIAGNOSIS — E876 Hypokalemia: Secondary | ICD-10-CM | POA: Diagnosis not present

## 2022-01-15 DIAGNOSIS — D751 Secondary polycythemia: Secondary | ICD-10-CM | POA: Diagnosis not present

## 2022-01-15 DIAGNOSIS — E1165 Type 2 diabetes mellitus with hyperglycemia: Secondary | ICD-10-CM | POA: Diagnosis not present

## 2022-01-15 DIAGNOSIS — E782 Mixed hyperlipidemia: Secondary | ICD-10-CM | POA: Diagnosis not present

## 2022-01-15 DIAGNOSIS — I10 Essential (primary) hypertension: Secondary | ICD-10-CM | POA: Diagnosis not present

## 2022-01-15 DIAGNOSIS — R221 Localized swelling, mass and lump, neck: Secondary | ICD-10-CM | POA: Diagnosis not present

## 2022-01-16 ENCOUNTER — Encounter: Payer: Medicare Other | Admitting: Internal Medicine

## 2022-01-20 DIAGNOSIS — E1165 Type 2 diabetes mellitus with hyperglycemia: Secondary | ICD-10-CM | POA: Diagnosis not present

## 2022-01-20 DIAGNOSIS — M545 Low back pain, unspecified: Secondary | ICD-10-CM | POA: Diagnosis not present

## 2022-01-20 DIAGNOSIS — R609 Edema, unspecified: Secondary | ICD-10-CM | POA: Diagnosis not present

## 2022-01-20 DIAGNOSIS — I10 Essential (primary) hypertension: Secondary | ICD-10-CM | POA: Diagnosis not present

## 2022-01-20 DIAGNOSIS — E7849 Other hyperlipidemia: Secondary | ICD-10-CM | POA: Diagnosis not present

## 2022-01-20 DIAGNOSIS — E042 Nontoxic multinodular goiter: Secondary | ICD-10-CM | POA: Diagnosis not present

## 2022-01-20 DIAGNOSIS — Z23 Encounter for immunization: Secondary | ICD-10-CM | POA: Diagnosis not present

## 2022-01-20 DIAGNOSIS — Z0001 Encounter for general adult medical examination with abnormal findings: Secondary | ICD-10-CM | POA: Diagnosis not present

## 2022-01-20 DIAGNOSIS — K649 Unspecified hemorrhoids: Secondary | ICD-10-CM | POA: Diagnosis not present

## 2022-01-20 DIAGNOSIS — I480 Paroxysmal atrial fibrillation: Secondary | ICD-10-CM | POA: Diagnosis not present

## 2022-01-23 ENCOUNTER — Encounter: Payer: Self-pay | Admitting: Cardiovascular Disease

## 2022-01-23 ENCOUNTER — Ambulatory Visit: Payer: Medicare Other | Attending: Cardiovascular Disease | Admitting: Cardiovascular Disease

## 2022-01-23 ENCOUNTER — Ambulatory Visit (INDEPENDENT_AMBULATORY_CARE_PROVIDER_SITE_OTHER): Payer: Medicare Other

## 2022-01-23 VITALS — BP 110/60 | HR 76 | Ht 63.0 in | Wt 158.2 lb

## 2022-01-23 DIAGNOSIS — I495 Sick sinus syndrome: Secondary | ICD-10-CM | POA: Diagnosis not present

## 2022-01-23 DIAGNOSIS — I480 Paroxysmal atrial fibrillation: Secondary | ICD-10-CM | POA: Diagnosis not present

## 2022-01-23 LAB — CUP PACEART REMOTE DEVICE CHECK
Battery Remaining Longevity: 161 mo
Battery Voltage: 3.21 V
Brady Statistic AP VP Percent: 0.74 %
Brady Statistic AP VS Percent: 77.13 %
Brady Statistic AS VP Percent: 0.09 %
Brady Statistic AS VS Percent: 22.04 %
Brady Statistic RA Percent Paced: 74.95 %
Brady Statistic RV Percent Paced: 2.46 %
Date Time Interrogation Session: 20231207202430
Implantable Lead Connection Status: 753985
Implantable Lead Connection Status: 753985
Implantable Lead Implant Date: 20110510
Implantable Lead Implant Date: 20110510
Implantable Lead Location: 753859
Implantable Lead Location: 753860
Implantable Lead Model: 5076
Implantable Lead Model: 5092
Implantable Pulse Generator Implant Date: 20230901
Lead Channel Impedance Value: 361 Ohm
Lead Channel Impedance Value: 361 Ohm
Lead Channel Impedance Value: 380 Ohm
Lead Channel Impedance Value: 418 Ohm
Lead Channel Pacing Threshold Amplitude: 0.5 V
Lead Channel Pacing Threshold Amplitude: 1.25 V
Lead Channel Pacing Threshold Pulse Width: 0.4 ms
Lead Channel Pacing Threshold Pulse Width: 0.4 ms
Lead Channel Sensing Intrinsic Amplitude: 1.875 mV
Lead Channel Sensing Intrinsic Amplitude: 1.875 mV
Lead Channel Sensing Intrinsic Amplitude: 4.25 mV
Lead Channel Sensing Intrinsic Amplitude: 4.25 mV
Lead Channel Setting Pacing Amplitude: 1.5 V
Lead Channel Setting Pacing Amplitude: 3.25 V
Lead Channel Setting Pacing Pulse Width: 0.4 ms
Lead Channel Setting Sensing Sensitivity: 0.9 mV
Zone Setting Status: 755011
Zone Setting Status: 755011

## 2022-01-23 NOTE — Progress Notes (Signed)
HPI [name] [name] returns today for followup. She is a pleasant 76 yo woman with symptomatic tachy-brady syndrome s/p PPM by Dr. Greggory Brandy over 11 years ago. She has rare palpitations. She has been placed on dofetilide and her atrial fib has been fairly well controlled.   She underwent generator change by dr. Lovena Le in September, 2023.   Allergies  Allergen Reactions   Benzonatate Cough   Codeine Nausea And Vomiting   Doxycycline Itching   Lisinopril Cough   Metformin And Related Rash     Current Outpatient Medications  Medication Sig Dispense Refill   Cholecalciferol (D-3-5) 125 MCG (5000 UT) capsule Take 5,000 Units by mouth daily.     cyanocobalamin (VITAMIN B12) 1000 MCG tablet Take 1,000 mcg by mouth at bedtime.     diltiazem (DILT-XR) 120 MG 24 hr capsule TAKE 2 CAPSULES BY MOUTH  DAILY IN THE MORNING (Patient taking differently: Take 120 mg by mouth in the morning and at bedtime.) 180 capsule 3   diltiazem (DILT-XR) 180 MG 24 hr capsule Take 1 capsule (180 mg total) by mouth at bedtime. 90 capsule 3   dofetilide (TIKOSYN) 500 MCG capsule TAKE 1 CAPSULE BY MOUTH TWICE  DAILY 200 capsule 2   JARDIANCE 25 MG TABS tablet Take 25 mg by mouth daily.     losartan (COZAAR) 50 MG tablet Take 50 mg by mouth daily.     MOUNJARO 7.5 MG/0.5ML Pen Inject 7.5 mg into the skin every Wednesday.     nitroGLYCERIN (NITROSTAT) 0.4 MG SL tablet Place 1 tablet (0.4 mg total) under the tongue every 5 (five) minutes x 3 doses as needed for chest pain (if no relief after 3 rd dose, proceed to ED for an evaluation). 75 tablet 3   potassium chloride SA (KLOR-CON M) 20 MEQ tablet TAKE 1 TABLET BY MOUTH  EVERY OTHER DAY 35 tablet 1   pravastatin (PRAVACHOL) 40 MG tablet Take 1 tablet (40 mg total) by mouth every evening. 90 tablet 3   rivaroxaban (XARELTO) 20 MG TABS tablet TAKE 1 TABLET BY MOUTH IN THE  EVENING 100 tablet 3   No current facility-administered medications for this visit.     Past Medical  History:  Diagnosis Date   Atrial fibrillation [facility])    Atrial flutter ([place])    Coronary atherosclerosis of native coronary artery    BMS to circumflex 9/08   HTN (hypertension)    Hyperlipidemia    Multiple thyroid nodules    Tachy-brady syndrome (HCC)    PPM - Medtronic   Type 2 diabetes mellitus (HCC)    Urticaria     ROS:   All systems reviewed and negative except as noted in the HPI.   Past Surgical History:  Procedure Laterality Date   BREAST LUMPECTOMY     COLONOSCOPY  2014    Last colonoscopy 2014 by Dr. Britta Mccreedy. Internal hemorrhoids, sigmoid diverticulosis, multiple sessile polyps, tubular adenomas.    PACEMAKER PLACEMENT     Medtronic   PPM GENERATOR CHANGEOUT N/A 10/17/2021   Procedure: PPM GENERATOR CHANGEOUT;  Surgeon: Evans Lance, MD;  Location: [facility] LAB;  Service: Cardiovascular;  Laterality: N/A;   ROTATOR CUFF REPAIR     VENTRAL HERNIA REPAIR     VESICOVAGINAL FISTULA CLOSURE W/ TAH       Family History  Problem Relation Age of Onset   Heart disease Father    Heart disease Other  one sibling   Colon cancer Neg Hx    Colon polyps Neg Hx      Social History   Socioeconomic History   Marital status: Married    Spouse name: Not on file   Number of children: Not on file   Years of education: Not on file   Highest education level: Not on file  Occupational History   Occupation: retired    Comment: [employer]  Tobacco Use   Smoking status: Never    Passive exposure: Never   Smokeless tobacco: Never  Vaping Use   Vaping Use: Never used  Substance and Sexual Activity   Alcohol use: Never    Alcohol/week: 0.0 standard drinks of alcohol   Drug use: Never   Sexual activity: Not on file  Other Topics Concern   Not on file  Social History Narrative   Not on file   Social Determinants of Health   Financial Resource Strain: Not on file  Food Insecurity: Not on file  Transportation Needs: Not on file  Physical Activity: Not  on file  Stress: Not on file  Social Connections: Not on file  Intimate Partner Violence: Not on file     BP 110/60   Pulse 76   Ht '5\' 3"'$  (1.6 m)   Wt 158 lb 3.2 oz (71.8 kg)   SpO2 96%   BMI 28.02 kg/m   Physical Exam:  Well appearing NAD Lungs: Normal work of breathing HEART:  Regular rate rhythm, no murmurs, no rubs, no clicks CHEST: The device site is normal -- no tenderness, edema, drainage, redness, threatened erosion. Ext:  2 plus pulses, no edema, no cyanosis, no clubbing    EKG- NSR   DEVICE  Normal device function.  See PaceArt for details.   Assess/Plan:  Sinus node dysfunction - she is asymptomatic s/p PPM insertion. She is atrial pacing about 75% of the time. PAF - she will continue dofetilide. She is out of rhythm about 4% of the time. Normal Cr. 11/2021 CAD - she denies anginal symptoms. HTN - her bp is well controlled. No change in her meds.  [name] Quitter, MD

## 2022-01-23 NOTE — Patient Instructions (Signed)
Medication Instructions:  Continue all current medications.  Labwork: none  Testing/Procedures: none  Follow-Up: 1 year - Dr.  Mealor    Any Other Special Instructions Will Be Listed Below (If Applicable).   If you need a refill on your cardiac medications before your next appointment, please call your pharmacy.  

## 2022-01-26 DIAGNOSIS — I2510 Atherosclerotic heart disease of native coronary artery without angina pectoris: Secondary | ICD-10-CM | POA: Diagnosis not present

## 2022-01-26 DIAGNOSIS — D751 Secondary polycythemia: Secondary | ICD-10-CM | POA: Diagnosis not present

## 2022-01-26 DIAGNOSIS — I10 Essential (primary) hypertension: Secondary | ICD-10-CM | POA: Diagnosis not present

## 2022-01-26 DIAGNOSIS — Z950 Presence of cardiac pacemaker: Secondary | ICD-10-CM | POA: Diagnosis not present

## 2022-01-26 DIAGNOSIS — Z7901 Long term (current) use of anticoagulants: Secondary | ICD-10-CM | POA: Diagnosis not present

## 2022-01-26 DIAGNOSIS — K625 Hemorrhage of anus and rectum: Secondary | ICD-10-CM | POA: Diagnosis not present

## 2022-01-27 ENCOUNTER — Ambulatory Visit: Payer: Medicare Other | Admitting: Internal Medicine

## 2022-01-27 DIAGNOSIS — Z23 Encounter for immunization: Secondary | ICD-10-CM | POA: Diagnosis not present

## 2022-02-04 ENCOUNTER — Telehealth: Payer: Self-pay | Admitting: Cardiology

## 2022-02-04 MED ORDER — DOFETILIDE 500 MCG PO CAPS: 500.0000 ug | ORAL_CAPSULE | Freq: Two times a day (BID) | ORAL | 2 refills | Status: DC

## 2022-02-04 NOTE — Telephone Encounter (Signed)
*  STAT* If patient is at the pharmacy, call can be transferred to refill team.   1. Which medications need to be refilled? (please list name of each medication and dose if known) dofetilide (TIKOSYN) 500 MCG capsule   2. Which pharmacy/location (including street and city if local pharmacy) is medication to be sent to? [place], [place]   3. Do they need a 30 day or 90 day supply? [place]

## 2022-02-13 NOTE — Progress Notes (Signed)
Remote pacemaker transmission.   

## 2022-03-29 ENCOUNTER — Other Ambulatory Visit: Payer: Self-pay | Admitting: Cardiology

## 2022-04-06 DIAGNOSIS — I10 Essential (primary) hypertension: Secondary | ICD-10-CM | POA: Diagnosis not present

## 2022-04-06 DIAGNOSIS — D751 Secondary polycythemia: Secondary | ICD-10-CM | POA: Diagnosis not present

## 2022-04-06 DIAGNOSIS — I2510 Atherosclerotic heart disease of native coronary artery without angina pectoris: Secondary | ICD-10-CM | POA: Diagnosis not present

## 2022-04-06 DIAGNOSIS — Z950 Presence of cardiac pacemaker: Secondary | ICD-10-CM | POA: Diagnosis not present

## 2022-04-21 DIAGNOSIS — E1165 Type 2 diabetes mellitus with hyperglycemia: Secondary | ICD-10-CM | POA: Diagnosis not present

## 2022-04-21 DIAGNOSIS — E7849 Other hyperlipidemia: Secondary | ICD-10-CM | POA: Diagnosis not present

## 2022-04-21 DIAGNOSIS — E876 Hypokalemia: Secondary | ICD-10-CM | POA: Diagnosis not present

## 2022-04-21 DIAGNOSIS — I10 Essential (primary) hypertension: Secondary | ICD-10-CM | POA: Diagnosis not present

## 2022-04-21 DIAGNOSIS — E11628 Type 2 diabetes mellitus with other skin complications: Secondary | ICD-10-CM | POA: Diagnosis not present

## 2022-04-21 DIAGNOSIS — E041 Nontoxic single thyroid nodule: Secondary | ICD-10-CM | POA: Diagnosis not present

## 2022-04-23 DIAGNOSIS — J029 Acute pharyngitis, unspecified: Secondary | ICD-10-CM | POA: Diagnosis not present

## 2022-04-24 ENCOUNTER — Ambulatory Visit: Payer: 59

## 2022-04-24 DIAGNOSIS — I495 Sick sinus syndrome: Secondary | ICD-10-CM | POA: Diagnosis not present

## 2022-04-24 LAB — CUP PACEART REMOTE DEVICE CHECK
Battery Remaining Longevity: 161 mo
Battery Voltage: 3.18 V
Brady Statistic AP VP Percent: 0.75 %
Brady Statistic AP VS Percent: 52 %
Brady Statistic AS VP Percent: 0.37 %
Brady Statistic AS VS Percent: 46.88 %
Brady Statistic RA Percent Paced: 50.94 %
Brady Statistic RV Percent Paced: 2.63 %
Date Time Interrogation Session: 20240308003107
Implantable Lead Connection Status: 753985
Implantable Lead Connection Status: 753985
Implantable Lead Implant Date: 20110510
Implantable Lead Implant Date: 20110510
Implantable Lead Location: 753859
Implantable Lead Location: 753860
Implantable Lead Model: 5076
Implantable Lead Model: 5092
Implantable Pulse Generator Implant Date: 20230901
Lead Channel Impedance Value: 361 Ohm
Lead Channel Impedance Value: 380 Ohm
Lead Channel Impedance Value: 399 Ohm
Lead Channel Impedance Value: 437 Ohm
Lead Channel Pacing Threshold Amplitude: 0.625 V
Lead Channel Pacing Threshold Amplitude: 1.625 V
Lead Channel Pacing Threshold Pulse Width: 0.4 ms
Lead Channel Pacing Threshold Pulse Width: 0.4 ms
Lead Channel Sensing Intrinsic Amplitude: 1.25 mV
Lead Channel Sensing Intrinsic Amplitude: 1.25 mV
Lead Channel Sensing Intrinsic Amplitude: 5 mV
Lead Channel Sensing Intrinsic Amplitude: 5 mV
Lead Channel Setting Pacing Amplitude: 1.5 V
Lead Channel Setting Pacing Amplitude: 3.25 V
Lead Channel Setting Pacing Pulse Width: 0.4 ms
Lead Channel Setting Sensing Sensitivity: 0.9 mV
Zone Setting Status: 755011
Zone Setting Status: 755011

## 2022-04-28 DIAGNOSIS — E042 Nontoxic multinodular goiter: Secondary | ICD-10-CM | POA: Diagnosis not present

## 2022-04-28 DIAGNOSIS — E7849 Other hyperlipidemia: Secondary | ICD-10-CM | POA: Diagnosis not present

## 2022-04-28 DIAGNOSIS — I480 Paroxysmal atrial fibrillation: Secondary | ICD-10-CM | POA: Diagnosis not present

## 2022-04-28 DIAGNOSIS — E1165 Type 2 diabetes mellitus with hyperglycemia: Secondary | ICD-10-CM | POA: Diagnosis not present

## 2022-04-28 DIAGNOSIS — J029 Acute pharyngitis, unspecified: Secondary | ICD-10-CM | POA: Diagnosis not present

## 2022-04-28 DIAGNOSIS — K649 Unspecified hemorrhoids: Secondary | ICD-10-CM | POA: Diagnosis not present

## 2022-04-28 DIAGNOSIS — M545 Low back pain, unspecified: Secondary | ICD-10-CM | POA: Diagnosis not present

## 2022-04-28 DIAGNOSIS — I10 Essential (primary) hypertension: Secondary | ICD-10-CM | POA: Diagnosis not present

## 2022-04-28 DIAGNOSIS — R609 Edema, unspecified: Secondary | ICD-10-CM | POA: Diagnosis not present

## 2022-05-05 NOTE — Progress Notes (Unsigned)
    Cardiology Office Note  Date: 05/06/2022   ID: [name] [name], DOB [birth year], MRN [id number]  History of Present Illness: [name] [name] is a 77 y.o. female last seen in September 2023.  She is here for a routine visit.  Doing well without any sense of palpitations, stable NYHA class II dyspnea.  Medtronic pacemaker in place, followed by Dr. Myles Gip. Recent device interrogation showed normal function with 3.2% AT/AT burden.  I reviewed her medications.  She reports no spontaneous bleeding problems on Xarelto.  Lab work from December 2023 showed normal hemoglobin and renal function.  She otherwise remains on Tikosyn and diltiazem as before.  Physical Exam: VS:  BP 122/62 (BP Location: Left Arm, Patient Position: Sitting, Cuff Size: Normal)   Pulse 82   Ht 5\' 3"  (1.6 m)   Wt 155 lb 12.8 oz (70.7 kg)   SpO2 96%   BMI 27.60 kg/m , BMI Body mass index is 27.6 kg/m.  Wt Readings from Last 3 Encounters:  05/06/22 155 lb 12.8 oz (70.7 kg)  01/23/22 158 lb 3.2 oz (71.8 kg)  10/22/21 162 lb 12.8 oz (73.8 kg)    General: Patient appears comfortable at rest. HEENT: Conjunctiva and lids normal. Neck: Supple, no elevated JVP or carotid bruits. Lungs: Clear to auscultation, nonlabored breathing at rest. Cardiac: Regular rate and rhythm, no S3 or significant systolic murmur. Extremities: No pitting edema.  ECG:  An ECG dated 01/23/2022 was personally reviewed today and demonstrated:  Sinus rhythm with prolonged PR interval, leftward axis, and LVH.  Labwork: November 2022: Cholesterol 138, triglycerides 89, HDL 51, LDL 70 10/17/2021: BUN 16; Creatinine, Ser 0.73; Hemoglobin 16.7; Platelets 238; Potassium 4.0; Sodium 141  December 2023: Hgb 15.5, platelets 207, potassium 4.0, BUN 20, creatinine 0.91  Other Studies Reviewed Today:  No interval cardiac testing for review today.  Assessment and Plan:  1. Paroxysmal atrial fibrillation with CHA2DS2-VASc score of 6.  She is symptomatically  stable on current regimen including Tikosyn, Cardizem CD, and Xarelto.  No reported bleeding problems.  I reviewed her lab work from December 2023.  She had only 3% arrhythmia burden by last device interrogation.  2. 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 Myles Gip.  3. CAD status post BMS to the circumflex in 2008.  No active angina.  She is on Pravachol and has as needed nitroglycerin available.  4. Essential hypertension.  Blood pressure is well-controlled today.  Continue Cozaar.  Disposition:  Follow up  6 months.  Signed, Satira Sark, M.D., F.A.C.C.

## 2022-05-06 ENCOUNTER — Ambulatory Visit: Payer: 59 | Attending: Cardiology | Admitting: Cardiology

## 2022-05-06 ENCOUNTER — Encounter: Payer: Self-pay | Admitting: Cardiology

## 2022-05-06 VITALS — BP 122/62 | HR 82 | Ht 63.0 in | Wt 155.8 lb

## 2022-05-06 DIAGNOSIS — I480 Paroxysmal atrial fibrillation: Secondary | ICD-10-CM

## 2022-05-06 DIAGNOSIS — I495 Sick sinus syndrome: Secondary | ICD-10-CM

## 2022-05-06 DIAGNOSIS — I25119 Atherosclerotic heart disease of native coronary artery with unspecified angina pectoris: Secondary | ICD-10-CM | POA: Diagnosis not present

## 2022-05-06 DIAGNOSIS — I10 Essential (primary) hypertension: Secondary | ICD-10-CM | POA: Diagnosis not present

## 2022-05-06 NOTE — Patient Instructions (Addendum)

## 2022-05-10 ENCOUNTER — Other Ambulatory Visit: Payer: Self-pay | Admitting: Cardiology

## 2022-05-22 NOTE — Progress Notes (Signed)
Remote pacemaker transmission.   

## 2022-06-02 DIAGNOSIS — E049 Nontoxic goiter, unspecified: Secondary | ICD-10-CM | POA: Diagnosis not present

## 2022-06-02 DIAGNOSIS — R1314 Dysphagia, pharyngoesophageal phase: Secondary | ICD-10-CM | POA: Diagnosis not present

## 2022-06-04 ENCOUNTER — Other Ambulatory Visit: Payer: Self-pay | Admitting: Otolaryngology

## 2022-06-04 DIAGNOSIS — E049 Nontoxic goiter, unspecified: Secondary | ICD-10-CM

## 2022-06-19 ENCOUNTER — Ambulatory Visit
Admission: RE | Admit: 2022-06-19 | Discharge: 2022-06-19 | Disposition: A | Discharge status: Home / Self Care | Payer: 59 | Source: Ambulatory Visit | Attending: Otolaryngology | Admitting: Otolaryngology

## 2022-06-19 DIAGNOSIS — R911 Solitary pulmonary nodule: Secondary | ICD-10-CM | POA: Diagnosis not present

## 2022-06-19 DIAGNOSIS — E049 Nontoxic goiter, unspecified: Secondary | ICD-10-CM

## 2022-07-02 ENCOUNTER — Other Ambulatory Visit: Payer: Self-pay | Admitting: Otolaryngology

## 2022-07-02 DIAGNOSIS — E049 Nontoxic goiter, unspecified: Secondary | ICD-10-CM

## 2022-07-24 ENCOUNTER — Ambulatory Visit (INDEPENDENT_AMBULATORY_CARE_PROVIDER_SITE_OTHER): Payer: 59

## 2022-07-24 DIAGNOSIS — I495 Sick sinus syndrome: Secondary | ICD-10-CM

## 2022-07-27 LAB — CUP PACEART REMOTE DEVICE CHECK
Battery Remaining Longevity: 159 mo
Battery Voltage: 3.15 V
Brady Statistic AP VP Percent: 0.58 %
Brady Statistic AP VS Percent: 51.76 %
Brady Statistic AS VP Percent: 0.19 %
Brady Statistic AS VS Percent: 47.46 %
Brady Statistic RA Percent Paced: 50.6 %
Brady Statistic RV Percent Paced: 2.05 %
Date Time Interrogation Session: 20240607012459
Implantable Lead Connection Status: 753985
Implantable Lead Connection Status: 753985
Implantable Lead Implant Date: 20110510
Implantable Lead Implant Date: 20110510
Implantable Lead Location: 753859
Implantable Lead Location: 753860
Implantable Lead Model: 5076
Implantable Lead Model: 5092
Implantable Pulse Generator Implant Date: 20230901
Lead Channel Impedance Value: 361 Ohm
Lead Channel Impedance Value: 380 Ohm
Lead Channel Impedance Value: 399 Ohm
Lead Channel Impedance Value: 456 Ohm
Lead Channel Pacing Threshold Amplitude: 0.625 V
Lead Channel Pacing Threshold Amplitude: 1.375 V
Lead Channel Pacing Threshold Pulse Width: 0.4 ms
Lead Channel Pacing Threshold Pulse Width: 0.4 ms
Lead Channel Sensing Intrinsic Amplitude: 2.125 mV
Lead Channel Sensing Intrinsic Amplitude: 2.125 mV
Lead Channel Sensing Intrinsic Amplitude: 4.625 mV
Lead Channel Sensing Intrinsic Amplitude: 4.625 mV
Lead Channel Setting Pacing Amplitude: 1.5 V
Lead Channel Setting Pacing Amplitude: 2.75 V
Lead Channel Setting Pacing Pulse Width: 0.4 ms
Lead Channel Setting Sensing Sensitivity: 0.9 mV
Zone Setting Status: 755011
Zone Setting Status: 755011

## 2022-07-28 ENCOUNTER — Other Ambulatory Visit: Payer: Self-pay | Admitting: Cardiology

## 2022-07-28 NOTE — Telephone Encounter (Signed)
Prescription refill request for Xarelto received.  Indication: PAF Last office visit: 05/06/22  Ival Bible MD Weight: 70.7kg Age: 77 Scr: 0.91 CrCl: 55.95  Based on above findings Xarelto 20mg  daily is the appropriate dose.  Refill approved.

## 2022-08-11 DIAGNOSIS — E041 Nontoxic single thyroid nodule: Secondary | ICD-10-CM | POA: Diagnosis not present

## 2022-08-11 DIAGNOSIS — E11628 Type 2 diabetes mellitus with other skin complications: Secondary | ICD-10-CM | POA: Diagnosis not present

## 2022-08-11 DIAGNOSIS — E7849 Other hyperlipidemia: Secondary | ICD-10-CM | POA: Diagnosis not present

## 2022-08-11 DIAGNOSIS — I10 Essential (primary) hypertension: Secondary | ICD-10-CM | POA: Diagnosis not present

## 2022-08-11 NOTE — Progress Notes (Signed)
Remote pacemaker transmission.   

## 2022-08-14 DIAGNOSIS — E11628 Type 2 diabetes mellitus with other skin complications: Secondary | ICD-10-CM | POA: Diagnosis not present

## 2022-08-14 DIAGNOSIS — I480 Paroxysmal atrial fibrillation: Secondary | ICD-10-CM | POA: Diagnosis not present

## 2022-08-14 DIAGNOSIS — R609 Edema, unspecified: Secondary | ICD-10-CM | POA: Diagnosis not present

## 2022-08-14 DIAGNOSIS — E042 Nontoxic multinodular goiter: Secondary | ICD-10-CM | POA: Diagnosis not present

## 2022-08-14 DIAGNOSIS — E041 Nontoxic single thyroid nodule: Secondary | ICD-10-CM | POA: Diagnosis not present

## 2022-08-14 DIAGNOSIS — K649 Unspecified hemorrhoids: Secondary | ICD-10-CM | POA: Diagnosis not present

## 2022-08-14 DIAGNOSIS — I10 Essential (primary) hypertension: Secondary | ICD-10-CM | POA: Diagnosis not present

## 2022-08-14 DIAGNOSIS — E7849 Other hyperlipidemia: Secondary | ICD-10-CM | POA: Diagnosis not present

## 2022-08-14 DIAGNOSIS — M545 Low back pain, unspecified: Secondary | ICD-10-CM | POA: Diagnosis not present

## 2022-08-31 ENCOUNTER — Other Ambulatory Visit: Payer: Self-pay | Admitting: Cardiology

## 2022-09-07 ENCOUNTER — Other Ambulatory Visit: Payer: Self-pay | Admitting: Cardiology

## 2022-09-28 DIAGNOSIS — D751 Secondary polycythemia: Secondary | ICD-10-CM | POA: Diagnosis not present

## 2022-10-23 ENCOUNTER — Ambulatory Visit (INDEPENDENT_AMBULATORY_CARE_PROVIDER_SITE_OTHER): Payer: 59

## 2022-10-23 DIAGNOSIS — I495 Sick sinus syndrome: Secondary | ICD-10-CM

## 2022-10-23 LAB — CUP PACEART REMOTE DEVICE CHECK
Battery Remaining Longevity: 155 mo
Battery Voltage: 3.1 V
Brady Statistic AP VP Percent: 0.8 %
Brady Statistic AP VS Percent: 76.61 %
Brady Statistic AS VP Percent: 0.08 %
Brady Statistic AS VS Percent: 22.51 %
Brady Statistic RA Percent Paced: 74.95 %
Brady Statistic RV Percent Paced: 2.27 %
Date Time Interrogation Session: 20240906000425
Implantable Lead Connection Status: 753985
Implantable Lead Connection Status: 753985
Implantable Lead Implant Date: 20110510
Implantable Lead Implant Date: 20110510
Implantable Lead Location: 753859
Implantable Lead Location: 753860
Implantable Lead Model: 5076
Implantable Lead Model: 5092
Implantable Pulse Generator Implant Date: 20230901
Lead Channel Impedance Value: 361 Ohm
Lead Channel Impedance Value: 361 Ohm
Lead Channel Impedance Value: 418 Ohm
Lead Channel Impedance Value: 456 Ohm
Lead Channel Pacing Threshold Amplitude: 0.5 V
Lead Channel Pacing Threshold Amplitude: 1.25 V
Lead Channel Pacing Threshold Pulse Width: 0.4 ms
Lead Channel Pacing Threshold Pulse Width: 0.4 ms
Lead Channel Sensing Intrinsic Amplitude: 1.5 mV
Lead Channel Sensing Intrinsic Amplitude: 1.5 mV
Lead Channel Sensing Intrinsic Amplitude: 4.625 mV
Lead Channel Sensing Intrinsic Amplitude: 4.625 mV
Lead Channel Setting Pacing Amplitude: 1.5 V
Lead Channel Setting Pacing Amplitude: 2.75 V
Lead Channel Setting Pacing Pulse Width: 0.4 ms
Lead Channel Setting Sensing Sensitivity: 0.9 mV
Zone Setting Status: 755011
Zone Setting Status: 755011

## 2022-10-27 NOTE — Progress Notes (Signed)
Remote pacemaker transmission.   

## 2022-11-13 ENCOUNTER — Encounter: Payer: Self-pay | Admitting: Cardiology

## 2022-11-13 ENCOUNTER — Ambulatory Visit: Payer: 59 | Attending: Cardiology | Admitting: Cardiology

## 2022-11-13 VITALS — BP 120/68 | HR 88 | Ht 63.0 in | Wt 154.0 lb

## 2022-11-13 DIAGNOSIS — I480 Paroxysmal atrial fibrillation: Secondary | ICD-10-CM | POA: Diagnosis not present

## 2022-11-13 DIAGNOSIS — I25119 Atherosclerotic heart disease of native coronary artery with unspecified angina pectoris: Secondary | ICD-10-CM

## 2022-11-13 DIAGNOSIS — I10 Essential (primary) hypertension: Secondary | ICD-10-CM | POA: Diagnosis not present

## 2022-11-13 DIAGNOSIS — I495 Sick sinus syndrome: Secondary | ICD-10-CM | POA: Diagnosis not present

## 2022-11-13 NOTE — Progress Notes (Signed)
    Cardiology Office Note  Date: 11/13/2022   ID: [name] [name], DOB [birth year], MRN [id number]  History of Present Illness: [name] [name] is a 77 y.o. female last seen in March.  She is here for a follow-up visit.  Her husband passed away recently, she is still grieving appropriately.  They were married 62 years.  She has other family support locally.  From a cardiac perspective, she does not describe any palpitations or chest discomfort.  I reviewed her medications.  She remains on diltiazem XR, Tikosyn, Cozaar, Xarelto, and Pravachol.  She does not report any spontaneous bleeding problems.  Medtronic pacemaker in place with follow-up with Dr. Nelly Laurence.  Recent device interrogation indicated 2.7% AF burden.  Physical Exam: VS:  BP 120/68 (BP Location: Left Arm)   Pulse 88   Ht 5\' 3"  (1.6 m)   Wt 154 lb (69.9 kg)   SpO2 96%   BMI 27.28 kg/m , BMI Body mass index is 27.28 kg/m.  Wt Readings from Last 3 Encounters:  11/13/22 154 lb (69.9 kg)  05/06/22 155 lb 12.8 oz (70.7 kg)  01/23/22 158 lb 3.2 oz (71.8 kg)    General: Patient appears comfortable at rest. HEENT: Conjunctiva and lids normal. Neck: Supple, no elevated JVP or carotid bruits. Lungs: Clear to auscultation, nonlabored breathing at rest. Cardiac: Regular rate and rhythm, no S3 or significant systolic murmur. Extremities: No pitting edema.  ECG:  An ECG dated 01/23/2022 was personally reviewed today and demonstrated:  Sinus rhythm with prolonged PR interval, leftward axis, LVH.  Labwork:  December 2023: Potassium 4.0, BUN 20, creatinine 0.91 August 2024: Hemoglobin 15.4, platelets 187  Other Studies Reviewed Today:  No interval cardiac testing for review today.  Assessment and Plan:  1. Paroxysmal atrial fibrillation with CHA2DS2-VASc score of 6.  She does not report any progressive palpitations and had only 2.7% arrhythmia burden based on most recent device interrogation.  Continue diltiazem XR and Tikosyn  along with Xarelto for stroke prophylaxis.   2. 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 Nelly Laurence.   3. CAD status post BMS to the circumflex in 2008.  No active angina.  She is not on aspirin given use of Xarelto.  Continue Pravachol and as needed nitroglycerin.  She is also on Jardiance and Ozempic as part of her type 2 diabetes regimen.   4. Essential hypertension.  Blood pressure is well-controlled today.  Disposition:  Follow up  6 months.  Signed, Jonelle Sidle, M.D., F.A.C.C. [facility]

## 2022-11-13 NOTE — Patient Instructions (Addendum)

## 2022-11-15 ENCOUNTER — Other Ambulatory Visit: Payer: Self-pay | Admitting: Cardiology

## 2022-11-17 DIAGNOSIS — E7849 Other hyperlipidemia: Secondary | ICD-10-CM | POA: Diagnosis not present

## 2022-11-17 DIAGNOSIS — I10 Essential (primary) hypertension: Secondary | ICD-10-CM | POA: Diagnosis not present

## 2022-11-17 DIAGNOSIS — E876 Hypokalemia: Secondary | ICD-10-CM | POA: Diagnosis not present

## 2022-11-17 DIAGNOSIS — E039 Hypothyroidism, unspecified: Secondary | ICD-10-CM | POA: Diagnosis not present

## 2022-11-17 DIAGNOSIS — E11628 Type 2 diabetes mellitus with other skin complications: Secondary | ICD-10-CM | POA: Diagnosis not present

## 2022-11-24 DIAGNOSIS — R609 Edema, unspecified: Secondary | ICD-10-CM | POA: Diagnosis not present

## 2022-11-24 DIAGNOSIS — Z23 Encounter for immunization: Secondary | ICD-10-CM | POA: Diagnosis not present

## 2022-11-24 DIAGNOSIS — E7849 Other hyperlipidemia: Secondary | ICD-10-CM | POA: Diagnosis not present

## 2022-11-24 DIAGNOSIS — M545 Low back pain, unspecified: Secondary | ICD-10-CM | POA: Diagnosis not present

## 2022-11-24 DIAGNOSIS — K649 Unspecified hemorrhoids: Secondary | ICD-10-CM | POA: Diagnosis not present

## 2022-11-24 DIAGNOSIS — I480 Paroxysmal atrial fibrillation: Secondary | ICD-10-CM | POA: Diagnosis not present

## 2022-11-24 DIAGNOSIS — I10 Essential (primary) hypertension: Secondary | ICD-10-CM | POA: Diagnosis not present

## 2022-11-24 DIAGNOSIS — E042 Nontoxic multinodular goiter: Secondary | ICD-10-CM | POA: Diagnosis not present

## 2022-11-24 DIAGNOSIS — E11628 Type 2 diabetes mellitus with other skin complications: Secondary | ICD-10-CM | POA: Diagnosis not present

## 2022-12-08 DIAGNOSIS — Z23 Encounter for immunization: Secondary | ICD-10-CM | POA: Diagnosis not present

## 2022-12-10 DIAGNOSIS — D751 Secondary polycythemia: Secondary | ICD-10-CM | POA: Diagnosis not present

## 2023-01-22 ENCOUNTER — Encounter: Payer: Self-pay | Admitting: Cardiovascular Disease

## 2023-01-22 ENCOUNTER — Ambulatory Visit (INDEPENDENT_AMBULATORY_CARE_PROVIDER_SITE_OTHER): Payer: 59

## 2023-01-22 ENCOUNTER — Ambulatory Visit: Payer: 59 | Attending: Cardiovascular Disease | Admitting: Cardiovascular Disease

## 2023-01-22 ENCOUNTER — Other Ambulatory Visit: Payer: Self-pay | Admitting: Cardiology

## 2023-01-22 VITALS — BP 122/72 | HR 80 | Ht 63.0 in | Wt 155.0 lb

## 2023-01-22 DIAGNOSIS — I495 Sick sinus syndrome: Secondary | ICD-10-CM | POA: Diagnosis not present

## 2023-01-22 DIAGNOSIS — I10 Essential (primary) hypertension: Secondary | ICD-10-CM | POA: Diagnosis not present

## 2023-01-22 DIAGNOSIS — Z79899 Other long term (current) drug therapy: Secondary | ICD-10-CM

## 2023-01-22 LAB — CUP PACEART INCLINIC DEVICE CHECK
Battery Remaining Longevity: 153 mo
Battery Voltage: 3.07 V
Brady Statistic AP VP Percent: 0.67 %
Brady Statistic AP VS Percent: 63.6 %
Brady Statistic AS VP Percent: 0.18 %
Brady Statistic AS VS Percent: 35.55 %
Brady Statistic RA Percent Paced: 61.85 %
Brady Statistic RV Percent Paced: 2.51 %
Date Time Interrogation Session: 20241206150053
Implantable Lead Connection Status: 753985
Implantable Lead Connection Status: 753985
Implantable Lead Implant Date: 20110510
Implantable Lead Implant Date: 20110510
Implantable Lead Location: 753859
Implantable Lead Location: 753860
Implantable Lead Model: 5076
Implantable Lead Model: 5092
Implantable Pulse Generator Implant Date: 20230901
Lead Channel Impedance Value: 399 Ohm
Lead Channel Impedance Value: 399 Ohm
Lead Channel Impedance Value: 437 Ohm
Lead Channel Impedance Value: 494 Ohm
Lead Channel Pacing Threshold Amplitude: 0.5 V
Lead Channel Pacing Threshold Amplitude: 1.125 V
Lead Channel Pacing Threshold Pulse Width: 0.4 ms
Lead Channel Pacing Threshold Pulse Width: 0.4 ms
Lead Channel Sensing Intrinsic Amplitude: 1.5 mV
Lead Channel Sensing Intrinsic Amplitude: 1.875 mV
Lead Channel Sensing Intrinsic Amplitude: 4.375 mV
Lead Channel Sensing Intrinsic Amplitude: 5.125 mV
Lead Channel Setting Pacing Amplitude: 1.5 V
Lead Channel Setting Pacing Amplitude: 2.25 V
Lead Channel Setting Pacing Pulse Width: 0.4 ms
Lead Channel Setting Sensing Sensitivity: 0.9 mV
Zone Setting Status: 755011
Zone Setting Status: 755011

## 2023-01-22 LAB — CUP PACEART REMOTE DEVICE CHECK
Battery Remaining Longevity: 153 mo
Battery Voltage: 3.07 V
Brady Statistic AP VP Percent: 0.56 %
Brady Statistic AP VS Percent: 74.15 %
Brady Statistic AS VP Percent: 0.07 %
Brady Statistic AS VS Percent: 25.22 %
Brady Statistic RA Percent Paced: 71.27 %
Brady Statistic RV Percent Paced: 2.78 %
Date Time Interrogation Session: 20241205194146
Implantable Lead Connection Status: 753985
Implantable Lead Connection Status: 753985
Implantable Lead Implant Date: 20110510
Implantable Lead Implant Date: 20110510
Implantable Lead Location: 753859
Implantable Lead Location: 753860
Implantable Lead Model: 5076
Implantable Lead Model: 5092
Implantable Pulse Generator Implant Date: 20230901
Lead Channel Impedance Value: 380 Ohm
Lead Channel Impedance Value: 380 Ohm
Lead Channel Impedance Value: 418 Ohm
Lead Channel Impedance Value: 475 Ohm
Lead Channel Pacing Threshold Amplitude: 0.5 V
Lead Channel Pacing Threshold Amplitude: 1.125 V
Lead Channel Pacing Threshold Pulse Width: 0.4 ms
Lead Channel Pacing Threshold Pulse Width: 0.4 ms
Lead Channel Sensing Intrinsic Amplitude: 2 mV
Lead Channel Sensing Intrinsic Amplitude: 2 mV
Lead Channel Sensing Intrinsic Amplitude: 4.25 mV
Lead Channel Sensing Intrinsic Amplitude: 4.25 mV
Lead Channel Setting Pacing Amplitude: 1.5 V
Lead Channel Setting Pacing Amplitude: 2.25 V
Lead Channel Setting Pacing Pulse Width: 0.4 ms
Lead Channel Setting Sensing Sensitivity: 0.9 mV
Zone Setting Status: 755011
Zone Setting Status: 755011

## 2023-01-22 MED ORDER — AMIODARONE HCL 200 MG PO TABS: 200.0000 mg | ORAL_TABLET | Freq: Every day | ORAL | 6 refills | Status: DC

## 2023-01-22 MED ORDER — AMIODARONE HCL 200 MG PO TABS: 200.0000 mg | ORAL_TABLET | Freq: Two times a day (BID) | ORAL | 0 refills | Status: DC

## 2023-01-22 NOTE — Progress Notes (Signed)
      HPI Mrs. Weeks returns today for followup. She is a pleasant 77 yo woman with symptomatic tachy-brady syndrome s/p PPM by Dr. Fawn Kirk over 11 years ago. She has rare palpitations. She has been placed on dofetilide and her atrial fib has been fairly well controlled.   She underwent generator change by dr. Ladona Ridgel in September, 2023.  Her atrial fibrillation has been managed with dofetilide.  She has continued to have episodes of atrial high rates detected by her device.  She does not have palpitations but she does complain of episodic fatigue.   BP 122/72   Pulse 80   Ht 5\' 3"  (1.6 m)   Wt 155 lb (70.3 kg)   SpO2 96%   BMI 27.46 kg/m   Physical Exam:  Well appearing NAD Lungs: Normal work of breathing HEART:  Regular rate rhythm, no murmurs, no rubs, no clicks CHEST: The device site is normal -- no tenderness, edema, drainage, redness, threatened erosion. Ext:  2 plus pulses, no edema, no cyanosis, no clubbing   DEVICE  Normal device function.  See PaceArt for details.   EKG EKG Interpretation Date/Time:  Friday January 22 2023 10:03:14 EST Ventricular Rate:  76 PR Interval:  240 QRS Duration:  100 QT Interval:  466 QTC Calculation: 524 R Axis:   16  Text Interpretation: Atrial-paced rhythm with prolonged AV conduction ST & T wave abnormality, consider inferolateral ischemia Prolonged QT When compared with ECG of 26-Jun-2009 02:59, No significant change was found Confirmed by York Pellant [phone]) on 01/22/2023 10:25:21 AM   Assess/Plan:   Sinus node dysfunction  she is asymptomatic s/p PPM insertion.  She is atrial pacing about 60% of the time.   PAF   QTC today is > 500 msec  She is out of rhythm about 5% of the time, having fatigue. Stop dofetilide due to Qtc prolongation Start amiodarone PO load then 200mg  daily  Check Cmet, Mg, TSH  Secondary hypercoagulable state Continue xarelto 20mg  Check CBC  CAD  she denies anginal symptoms.  HTN  her bp  is well controlled.  No change in her meds.  Follow-up 2 months  Maurice Small, MD

## 2023-01-22 NOTE — Patient Instructions (Addendum)
Medication Instructions:   Stop Tikosyn (Dofetilide) Begin Amiodarone 200mg  twice a day x 2 weeks, then 200mg  daily  Continue all other medications.     Labwork:  CMET, TSH, CBC - orders given today Office will contact with results via phone, letter or mychart.     Testing/Procedures:  none  Follow-Up:  2 months   Any Other Special Instructions Will Be Listed Below (If Applicable).   If you need a refill on your cardiac medications before your next appointment, please call your pharmacy.

## 2023-01-29 DIAGNOSIS — I10 Essential (primary) hypertension: Secondary | ICD-10-CM | POA: Diagnosis not present

## 2023-01-29 DIAGNOSIS — Z79899 Other long term (current) drug therapy: Secondary | ICD-10-CM | POA: Diagnosis not present

## 2023-02-02 ENCOUNTER — Encounter: Payer: Self-pay | Admitting: Cardiovascular Disease

## 2023-02-02 ENCOUNTER — Telehealth: Payer: Self-pay | Admitting: Cardiovascular Disease

## 2023-02-02 ENCOUNTER — Encounter: Payer: Self-pay | Admitting: *Deleted

## 2023-02-02 NOTE — Telephone Encounter (Signed)
Says she was instructed to call office when she is almost finished with 2 week coarse of amiodarone 200 mg BID so that a new rx would be sent to [facility] for amiodarone 200 mg daily. Advised that a new prescription would be sent. Verbalized understanding.   After review of chart, it appears that rx was sent on 01/22/2023 #30 with 6 refills to [facility]. Patient contacted and informed. Verbalized understanding.

## 2023-02-02 NOTE — Telephone Encounter (Signed)
error 

## 2023-02-02 NOTE — Telephone Encounter (Signed)
New Message:     Patient says she needs to talk to [facility], about her medicine please.

## 2023-02-03 DIAGNOSIS — M25561 Pain in right knee: Secondary | ICD-10-CM | POA: Diagnosis not present

## 2023-02-03 DIAGNOSIS — Z7984 Long term (current) use of oral hypoglycemic drugs: Secondary | ICD-10-CM | POA: Diagnosis not present

## 2023-02-03 DIAGNOSIS — E119 Type 2 diabetes mellitus without complications: Secondary | ICD-10-CM | POA: Diagnosis not present

## 2023-02-03 DIAGNOSIS — E7800 Pure hypercholesterolemia, unspecified: Secondary | ICD-10-CM | POA: Diagnosis not present

## 2023-02-03 DIAGNOSIS — Z7901 Long term (current) use of anticoagulants: Secondary | ICD-10-CM | POA: Diagnosis not present

## 2023-02-03 DIAGNOSIS — M25572 Pain in left ankle and joints of left foot: Secondary | ICD-10-CM | POA: Diagnosis not present

## 2023-02-03 DIAGNOSIS — Z79899 Other long term (current) drug therapy: Secondary | ICD-10-CM | POA: Diagnosis not present

## 2023-02-03 DIAGNOSIS — R300 Dysuria: Secondary | ICD-10-CM | POA: Diagnosis not present

## 2023-02-03 DIAGNOSIS — I509 Heart failure, unspecified: Secondary | ICD-10-CM | POA: Diagnosis not present

## 2023-02-03 DIAGNOSIS — M25571 Pain in right ankle and joints of right foot: Secondary | ICD-10-CM | POA: Diagnosis not present

## 2023-02-03 DIAGNOSIS — M7732 Calcaneal spur, left foot: Secondary | ICD-10-CM | POA: Diagnosis not present

## 2023-02-03 DIAGNOSIS — Z885 Allergy status to narcotic agent status: Secondary | ICD-10-CM | POA: Diagnosis not present

## 2023-02-03 DIAGNOSIS — M25462 Effusion, left knee: Secondary | ICD-10-CM | POA: Diagnosis not present

## 2023-02-03 DIAGNOSIS — I110 Hypertensive heart disease with heart failure: Secondary | ICD-10-CM | POA: Diagnosis not present

## 2023-02-03 DIAGNOSIS — Z888 Allergy status to other drugs, medicaments and biological substances status: Secondary | ICD-10-CM | POA: Diagnosis not present

## 2023-02-03 DIAGNOSIS — M25562 Pain in left knee: Secondary | ICD-10-CM | POA: Diagnosis not present

## 2023-02-03 DIAGNOSIS — M19071 Primary osteoarthritis, right ankle and foot: Secondary | ICD-10-CM | POA: Diagnosis not present

## 2023-02-07 ENCOUNTER — Other Ambulatory Visit: Payer: Self-pay | Admitting: Cardiology

## 2023-02-15 DIAGNOSIS — E11620 Type 2 diabetes mellitus with diabetic dermatitis: Secondary | ICD-10-CM | POA: Diagnosis not present

## 2023-02-15 DIAGNOSIS — Z20822 Contact with and (suspected) exposure to covid-19: Secondary | ICD-10-CM | POA: Diagnosis not present

## 2023-02-15 DIAGNOSIS — R0989 Other specified symptoms and signs involving the circulatory and respiratory systems: Secondary | ICD-10-CM | POA: Diagnosis not present

## 2023-02-15 DIAGNOSIS — R030 Elevated blood-pressure reading, without diagnosis of hypertension: Secondary | ICD-10-CM | POA: Diagnosis not present

## 2023-02-15 DIAGNOSIS — R059 Cough, unspecified: Secondary | ICD-10-CM | POA: Diagnosis not present

## 2023-02-18 ENCOUNTER — Telehealth: Payer: Self-pay | Admitting: Cardiology

## 2023-02-18 MED ORDER — AMIODARONE HCL 200 MG PO TABS: 200.0000 mg | ORAL_TABLET | Freq: Every day | ORAL | 6 refills | Status: DC

## 2023-02-18 NOTE — Telephone Encounter (Signed)
 Spoke to pt who just wanted to let gen card know that EP d/c'd Dofetilide. Advised pt that gen card provider can see note.   Patient had no further questions or concerns.

## 2023-02-18 NOTE — Telephone Encounter (Signed)
 Refill request completed- left message for patient to call office regarding the discontinuation of medications.

## 2023-02-18 NOTE — Telephone Encounter (Signed)
 Pt c/o medication issue:  1. Name of Medication: amiodarone  (PACERONE ) 200 MG tablet   2. How are you currently taking this medication (dosage and times per day)?   3. Are you having a reaction (difficulty breathing--STAT)?   4. What is your medication issue?    Please send Amiodaron to:  [facility] - [name], [name] - [address] [facility]    Patient is also requesting call back to discuss other medication that she would like to discontinue.

## 2023-03-31 ENCOUNTER — Other Ambulatory Visit: Payer: Self-pay | Admitting: Cardiology

## 2023-04-06 ENCOUNTER — Other Ambulatory Visit: Payer: Self-pay | Admitting: Cardiology

## 2023-04-06 DIAGNOSIS — I10 Essential (primary) hypertension: Secondary | ICD-10-CM | POA: Diagnosis not present

## 2023-04-06 DIAGNOSIS — Z950 Presence of cardiac pacemaker: Secondary | ICD-10-CM | POA: Diagnosis not present

## 2023-04-06 DIAGNOSIS — I482 Chronic atrial fibrillation, unspecified: Secondary | ICD-10-CM | POA: Diagnosis not present

## 2023-04-06 DIAGNOSIS — E1165 Type 2 diabetes mellitus with hyperglycemia: Secondary | ICD-10-CM | POA: Diagnosis not present

## 2023-04-08 DIAGNOSIS — Z0001 Encounter for general adult medical examination with abnormal findings: Secondary | ICD-10-CM | POA: Diagnosis not present

## 2023-04-09 ENCOUNTER — Ambulatory Visit: Payer: 59 | Admitting: Cardiovascular Disease

## 2023-04-16 ENCOUNTER — Ambulatory Visit: Payer: 59 | Admitting: Cardiovascular Disease

## 2023-04-22 DIAGNOSIS — R6889 Other general symptoms and signs: Secondary | ICD-10-CM | POA: Diagnosis not present

## 2023-04-23 ENCOUNTER — Ambulatory Visit: Payer: 59 | Attending: Cardiovascular Disease | Admitting: Cardiovascular Disease

## 2023-04-23 ENCOUNTER — Ambulatory Visit (INDEPENDENT_AMBULATORY_CARE_PROVIDER_SITE_OTHER): Payer: Self-pay

## 2023-04-23 ENCOUNTER — Encounter: Payer: Self-pay | Admitting: Cardiovascular Disease

## 2023-04-23 VITALS — BP 120/62 | HR 91 | Ht 63.0 in | Wt 150.2 lb

## 2023-04-23 DIAGNOSIS — I480 Paroxysmal atrial fibrillation: Secondary | ICD-10-CM | POA: Diagnosis not present

## 2023-04-23 DIAGNOSIS — I495 Sick sinus syndrome: Secondary | ICD-10-CM

## 2023-04-23 DIAGNOSIS — Z01812 Encounter for preprocedural laboratory examination: Secondary | ICD-10-CM | POA: Diagnosis not present

## 2023-04-23 LAB — CUP PACEART INCLINIC DEVICE CHECK
Date Time Interrogation Session: 20250307163807
Implantable Lead Connection Status: 753985
Implantable Lead Connection Status: 753985
Implantable Lead Implant Date: 20110510
Implantable Lead Implant Date: 20110510
Implantable Lead Location: 753859
Implantable Lead Location: 753860
Implantable Lead Model: 5076
Implantable Lead Model: 5092
Implantable Pulse Generator Implant Date: 20230901

## 2023-04-23 NOTE — H&P (View-Only) (Signed)
      HPI [name] returns today for followup. She is a pleasant 78 yo woman with symptomatic tachy-brady syndrome s/p PPM by Dr. Fawn Kirk over 11 years ago. She has rare palpitations. She has been placed on dofetilide and her atrial fib has been fairly well controlled.   She underwent generator change by dr. Ladona Ridgel in September, 2023.  Her atrial fibrillation has been managed with dofetilide.  She has continued to have episodes of atrial high rates detected by her device.  She does not have palpitations but she does complain of episodic fatigue.  Today in follow-up (April 23, 2023), she notes significant increased and fatigue.  This began in December and has been persistent.  Her quality of life and ability to perform ADLs has been affected.   BP 120/62   Pulse 91   Ht 5\' 3"  (1.6 m)   Wt 150 lb 3.2 oz (68.1 kg)   SpO2 95%   BMI 26.61 kg/m   Physical Exam:  Well appearing NAD Lungs: Normal work of breathing HEART:  Regular rate rhythm, no murmurs, no rubs, no clicks CHEST: The device site is normal -- no tenderness, edema, drainage, redness, threatened erosion. Ext:  2 plus pulses, no edema, no cyanosis, no clubbing   DEVICE  Normal device function.  See PaceArt for details.   EKG EKG Interpretation Date/Time:  Friday April 23 2023 15:42:21 EST Ventricular Rate:  71 PR Interval:    QRS Duration:  170 QT Interval:  470 QTC Calculation: 510 R Axis:   -68  Text Interpretation: Atrial flutter, Ventricular-paced rhythm When compared with ECG of 22-Jan-2023 10:03, Electronic ventricular pacemaker has replaced Electronic atrial pacemaker Confirmed by York Pellant [phone]) on 04/23/2023 3:47:54 PM   Assess/Plan:   Sinus node dysfunction  she is asymptomatic s/p PPM insertion.  She is atrial pacing about 60% of the time.   PAF   Failed Tikosyn due to Qtc prolongation, failed amiodarone -- now in flutter History of fibrillation and flutter --onset of persistent A-fib/flutter  correlates with severe fatigue We discussed management options which would include ablation now that she has failed amiodarone or continued rate control.  She would prefer to proceed with ablation. Will refer for cardioversion.  Continue amiodarone pending ablation Will see prior to ablation to assess effects of cardioversion.  We discussed the indication, rationale, logistics, anticipated benefits, and potential risks of the ablation procedure including but not limited to -- bleed at the groin access site, chest pain, damage to nearby organs such as the diaphragm, lungs, or esophagus, need for a drainage tube, or prolonged hospitalization. I explained that the risk for stroke, heart attack, need for open chest surgery, or even death is very low but not zero. she  expressed understanding and wishes to proceed.   Secondary hypercoagulable state Continue xarelto 20mg  Check CBC  CAD  she denies anginal symptoms.  HTN  her bp is well controlled.  No change in her meds.  Follow-up 2 months  Maurice Small, MD

## 2023-04-23 NOTE — Progress Notes (Signed)
      HPI [name] [name] returns today for followup. She is a pleasant 78 yo woman with symptomatic tachy-brady syndrome s/p PPM by Dr. Fawn Kirk over 11 years ago. She has rare palpitations. She has been placed on dofetilide and her atrial fib has been fairly well controlled.   She underwent generator change by dr. Ladona Ridgel in September, 2023.  Her atrial fibrillation has been managed with dofetilide.  She has continued to have episodes of atrial high rates detected by her device.  She does not have palpitations but she does complain of episodic fatigue.  Today in follow-up (April 23, 2023), she notes significant increased and fatigue.  This began in December and has been persistent.  Her quality of life and ability to perform ADLs has been affected.   BP 120/62   Pulse 91   Ht 5\' 3"  (1.6 m)   Wt 150 lb 3.2 oz (68.1 kg)   SpO2 95%   BMI 26.61 kg/m   Physical Exam:  Well appearing NAD Lungs: Normal work of breathing HEART:  Regular rate rhythm, no murmurs, no rubs, no clicks CHEST: The device site is normal -- no tenderness, edema, drainage, redness, threatened erosion. Ext:  2 plus pulses, no edema, no cyanosis, no clubbing   DEVICE  Normal device function.  See PaceArt for details.   EKG EKG Interpretation Date/Time:  Friday April 23 2023 15:42:21 EST Ventricular Rate:  71 PR Interval:    QRS Duration:  170 QT Interval:  470 QTC Calculation: 510 R Axis:   -68  Text Interpretation: Atrial flutter, Ventricular-paced rhythm When compared with ECG of 22-Jan-2023 10:03, Electronic ventricular pacemaker has replaced Electronic atrial pacemaker Confirmed by York Pellant [phone]) on 04/23/2023 3:47:54 PM   Assess/Plan:   Sinus node dysfunction  she is asymptomatic s/p PPM insertion.  She is atrial pacing about 60% of the time.   PAF   Failed Tikosyn due to Qtc prolongation, failed amiodarone -- now in flutter History of fibrillation and flutter --onset of persistent A-fib/flutter  correlates with severe fatigue We discussed management options which would include ablation now that she has failed amiodarone or continued rate control.  She would prefer to proceed with ablation. Will refer for cardioversion.  Continue amiodarone pending ablation Will see prior to ablation to assess effects of cardioversion.  We discussed the indication, rationale, logistics, anticipated benefits, and potential risks of the ablation procedure including but not limited to -- bleed at the groin access site, chest pain, damage to nearby organs such as the diaphragm, lungs, or esophagus, need for a drainage tube, or prolonged hospitalization. I explained that the risk for stroke, heart attack, need for open chest surgery, or even death is very low but not zero. she  expressed understanding and wishes to proceed.   Secondary hypercoagulable state Continue xarelto 20mg  Check CBC  CAD  she denies anginal symptoms.  HTN  her bp is well controlled.  No change in her meds.  Follow-up 2 months  Maurice Small, MD

## 2023-04-23 NOTE — Patient Instructions (Addendum)
 Medication Instructions:   Continue all current medications.   Labwork:  BMET, CBC - orders given  Can do same day as your pre-op for the cardioversion  Testing/Procedures:  Your physician has recommended that you have a Cardioversion (DCCV). Electrical Cardioversion uses a jolt of electricity to your heart either through paddles or wired patches attached to your chest. This is a controlled, usually prescheduled, procedure. Defibrillation is done under light anesthesia in the hospital, and you usually go home the day of the procedure. This is done to get your heart back into a normal rhythm. You are not awake for the procedure. Please see the instruction sheet given to you today. Your physician has recommended that you have an ablation. Catheter ablation is a medical procedure used to treat some cardiac arrhythmias (irregular heartbeats). During catheter ablation, a long, thin, flexible tube is put into a blood vessel in your groin (upper thigh), or neck. This tube is called an ablation catheter. It is then guided to your heart through the blood vessel. Radio frequency waves destroy small areas of heart tissue where abnormal heartbeats may cause an arrhythmia to start. Please see the instruction sheet given to you today.   Follow-Up:  Pending    Any Other Special Instructions Will Be Listed Below (If Applicable).   If you need a refill on your cardiac medications before your next appointment, please call your pharmacy.

## 2023-04-25 LAB — CUP PACEART REMOTE DEVICE CHECK
Battery Remaining Longevity: 152 mo
Battery Voltage: 3.04 V
Brady Statistic AP VP Percent: 0.89 %
Brady Statistic AP VS Percent: 87.23 %
Brady Statistic AS VP Percent: 0.08 %
Brady Statistic AS VS Percent: 11.76 %
Brady Statistic RA Percent Paced: 17.17 %
Brady Statistic RV Percent Paced: 39.58 %
Date Time Interrogation Session: 20250307011746
Implantable Lead Connection Status: 753985
Implantable Lead Connection Status: 753985
Implantable Lead Implant Date: 20110510
Implantable Lead Implant Date: 20110510
Implantable Lead Location: 753859
Implantable Lead Location: 753860
Implantable Lead Model: 5076
Implantable Lead Model: 5092
Implantable Pulse Generator Implant Date: 20230901
Lead Channel Impedance Value: 342 Ohm
Lead Channel Impedance Value: 418 Ohm
Lead Channel Impedance Value: 475 Ohm
Lead Channel Impedance Value: 494 Ohm
Lead Channel Pacing Threshold Amplitude: 0.625 V
Lead Channel Pacing Threshold Amplitude: 1.125 V
Lead Channel Pacing Threshold Pulse Width: 0.4 ms
Lead Channel Pacing Threshold Pulse Width: 0.4 ms
Lead Channel Sensing Intrinsic Amplitude: 2 mV
Lead Channel Sensing Intrinsic Amplitude: 2 mV
Lead Channel Sensing Intrinsic Amplitude: 5.375 mV
Lead Channel Sensing Intrinsic Amplitude: 5.375 mV
Lead Channel Setting Pacing Amplitude: 1.5 V
Lead Channel Setting Pacing Amplitude: 2.25 V
Lead Channel Setting Pacing Pulse Width: 0.4 ms
Lead Channel Setting Sensing Sensitivity: 0.9 mV
Zone Setting Status: 755011
Zone Setting Status: 755011

## 2023-04-27 ENCOUNTER — Telehealth: Payer: Self-pay | Admitting: Cardiology

## 2023-04-27 NOTE — Telephone Encounter (Signed)
 Explained to patient that I am working on getting her cardioversion scheduled.    She also c/o swelling in her ankles since last week.  She did not mention this to Dr. Nelly Laurence as she thought she was supposed to call her pcp for that.  She did & they suggested that she call our office.  No c/o weight gain, SOB or chest pain.  States swelling is mostly in the evenings & does go down over night.    Patient last seen by Dr. Nelly Laurence on 04/23/2023 with pending DCCV & afib ablation.    Last seen by Dr. Diona Browner was 11/13/2022 & is due this March for her 6 mo f/u.

## 2023-04-27 NOTE — Telephone Encounter (Signed)
 Pt c/o swelling/edema: STAT if pt has developed SOB within 24 hours  If swelling, where is the swelling located? ankles  How much weight have you gained and in what time span?   Have you gained 2 pounds in a day or 5 pounds in a week?   Do you have a log of your daily weights (if so, list)?   Are you currently taking a fluid pill? no  Are you currently SOB? no  Have you traveled recently in a car or plane for an extended period of time?  Patient says she needs to talk to [facility] please.

## 2023-04-29 NOTE — Telephone Encounter (Signed)
 Left message to return call

## 2023-04-30 MED ORDER — FUROSEMIDE 20 MG PO TABS: 20.0000 mg | ORAL_TABLET | ORAL | 1 refills | Status: DC | PRN

## 2023-04-30 NOTE — Telephone Encounter (Signed)
Patient notified and verbalized understanding.  Will send new prescription to [facility] now.   

## 2023-04-30 NOTE — Telephone Encounter (Signed)
 Follow Up:      Patient is returning a call from yesterday.

## 2023-04-30 NOTE — Telephone Encounter (Signed)
 Cardioversion scheduled for Friday, 05/07/2023 at 9:00 am with Dr. Diona Browner at [facility] - will need to arrive at 7:00 am.  Pre-op day scheduled for Wednesday, 05/05/23 at 11:00 am at [facility].  Patient made aware & will come by office to pick up instruction sheet on Monday.

## 2023-05-03 ENCOUNTER — Telehealth: Payer: Self-pay | Admitting: Cardiovascular Disease

## 2023-05-03 ENCOUNTER — Encounter: Payer: Self-pay | Admitting: *Deleted

## 2023-05-03 ENCOUNTER — Other Ambulatory Visit: Payer: Self-pay | Admitting: *Deleted

## 2023-05-03 ENCOUNTER — Other Ambulatory Visit: Payer: Self-pay | Admitting: Cardiology

## 2023-05-03 DIAGNOSIS — I4819 Other persistent atrial fibrillation: Secondary | ICD-10-CM

## 2023-05-03 NOTE — Patient Instructions (Signed)
 [name] [name]  05/03/2023     @PREFPERIOPPHARMACY @   Your procedure is scheduled on  05/07/2023.   Report to [facility] at  0700  A.M.   Call this number if you have problems the morning of surgery:  [phone]  If you experience any cold or flu symptoms such as cough, fever, chills, shortness of breath, etc. between now and your scheduled surgery, please notify us at the above number.   Remember:  DO NOT miss any doses of your xarelto before your procedure.      Your last dose of mounjaro should have been on 04/29/2023.       YOur last dose of jardiance should be on 05/03/2023.    Do not eat after midnight.   You may drink clear liquids until 0500 am on 05/07/2023.    Clear liquids allowed are:                    Water, Juice (No red color; non-citric and without pulp; diabetics please choose diet or no sugar options), Carbonated beverages (diabetics please choose diet or no sugar options), Clear Tea (No creamer, milk, or cream, including half & half and powdered creamer), Black Coffee Only (No creamer, milk or cream, including half & half and powdered creamer), and Clear Sports drink (No red color; diabetics please choose diet or no sugar options)    Take these medicines the morning of surgery with A SIP OF WATER                        amiodarone, diltiazem, xarelto(if you take in the morning.)     Do not wear jewelry, make-up or nail polish, including gel polish,  artificial nails, or any other type of covering on natural nails (fingers and  toes).  Do not wear lotions, powders, or perfumes, or deodorant.  Do not shave 48 hours prior to surgery.  Men may shave face and neck.  Do not bring valuables to the hospital.  [facility] is not responsible for any belongings or valuables.  Contacts, dentures or bridgework may not be worn into surgery.  Leave your suitcase in the car.  After surgery it may be brought to your room.  For patients admitted to the hospital,  discharge time will be determined by your treatment team.  Patients discharged the day of surgery will not be allowed to drive home and must have someone with them for 24 hours.    Special instructions:  DO NOT smoke tobacco or vape for 24 hours before your procedure.  Please read over the following fact sheets that you were given. Anesthesia Post-op Instructions and Care and Recovery After Surgery      Electrical Cardioversion Electrical cardioversion is the delivery of a jolt of electricity to restore a normal rhythm to the heart. A rhythm that is too fast or is not regular (arrhythmia) keeps the heart from pumping blood well. There is also another type of cardioversion called a chemical (pharmacologic) cardioversion. This is when your health care provider gives you one or more medicines to bring back your regular heart rhythm. Electrical cardioversion is done as a scheduled procedure for arrhythmiasthat are not life-threatening. Electrical cardioversion may also be done in an emergency for sudden life-threatening arrhythmias. Tell a health care provider about: Any allergies you have. All medicines you are taking, including vitamins, herbs, eye drops, creams, and over-the-counter medicines. Any problems  you or family members have had with sedatives or anesthesia. Any bleeding problems you have. Any surgeries you have had, including a pacemaker, defibrillator, or other implanted device. Any medical conditions you have. Whether you are pregnant or may be pregnant. What are the risks? Your provider will talk with you about risks. These include: Allergic reactions to medicines. Irritation to the skin on your chest or back where the sticky pads (electrodes) or paddles were put during electrical cardioversion. A blood clot that breaks free and travels to other parts of your body, such as your brain. Return of a worse abnormal heart rhythm that will need to be treated with medicines, a  pacemaker, or an implantable cardioverter defibrillator (ICD). What happens before the procedure? Medicines Your provider may give you: Blood-thinning medicines (anticoagulants) so your blood does not clot as easily. If your provider gives you this medicine, you may need to take it for 4 weeks before the procedure. Medicines to help stabilize your heart rate and rhythm. Ask your provider about: Changing or stopping your regular medicines. These include any diabetes medicines or blood thinners you take. Taking medicines such as aspirin and ibuprofen. These medicines can thin your blood. Do not take them unless your provider tells you to. Taking over-the-counter medicines, vitamins, herbs, and supplements. General instructions Follow instructions from your provider about what you may eat and drink. Do not put any lotions, powders, or ointments on your chest and back for 24 hours before the procedure. They can cause problems with the electrodes or paddles used to deliver electricity to your heart. Do not wear jewelry as this can interfere with delivering electricity to your heart. If you will be going home right after the procedure, plan to have a responsible adult: Take you home from the hospital or clinic. You will not be allowed to drive. Care for you for the time you are told. Tests You may have an exam or testing. This may include: Blood labs. A transesophageal echocardiogram (TEE). What happens during the procedure?     An IV will be inserted into one of your veins. You will be given a sedative. This helps you relax. Electrodes or metal paddles will be placed on your chest. They may be placed in one of these ways: One placed on your right chest, the other on the left ribs. One placed on your chest and the other on your back. An electrical shock will be delivered. The shock briefly stops (resets) your heart rhythm. Your provider will check to see if your heart rhythm is now normal.  Some people need only one shock. Some need more to restore a normal heart rhythm. The procedure may vary among providers and hospitals. What happens after the procedure? Your blood pressure, heart rate, breathing rate, and blood oxygen level will be monitored until you leave the hospital or clinic. Your heart rhythm will be watched to make sure it does not change. This information is not intended to replace advice given to you by your health care provider. Make sure you discuss any questions you have with your health care provider. Document Revised: 09/25/2021 Document Reviewed: 09/25/2021 Elsevier Patient Education  2024 [employer].General Anesthesia, Adult, Care After The following information offers guidance on how to care for yourself after your procedure. Your health care provider may also give you more specific instructions. If you have problems or questions, contact your health care provider. What can I expect after the procedure? After the procedure, it is common for people  to: Have pain or discomfort at the IV site. Have nausea or vomiting. Have a sore throat or hoarseness. Have trouble concentrating. Feel cold or chills. Feel weak, sleepy, or tired (fatigue). Have soreness and body aches. These can affect parts of the body that were not involved in surgery. Follow these instructions at home: For the time period you were told by your health care provider:  Rest. Do not participate in activities where you could fall or become injured. Do not drive or use machinery. Do not drink alcohol. Do not take sleeping pills or medicines that cause drowsiness. Do not make important decisions or sign legal documents. Do not take care of children on your own. General instructions Drink enough fluid to keep your urine pale yellow. If you have sleep apnea, surgery and certain medicines can increase your risk for breathing problems. Follow instructions from your health care provider about  wearing your sleep device: Anytime you are sleeping, including during daytime naps. While taking prescription pain medicines, sleeping medicines, or medicines that make you drowsy. Return to your normal activities as told by your health care provider. Ask your health care provider what activities are safe for you. Take over-the-counter and prescription medicines only as told by your health care provider. Do not use any products that contain nicotine or tobacco. These products include cigarettes, chewing tobacco, and vaping devices, such as e-cigarettes. These can delay incision healing after surgery. If you need help quitting, ask your health care provider. Contact a health care provider if: You have nausea or vomiting that does not get better with medicine. You vomit every time you eat or drink. You have pain that does not get better with medicine. You cannot urinate or have bloody urine. You develop a skin rash. You have a fever. Get help right away if: You have trouble breathing. You have chest pain. You vomit blood. These symptoms may be an emergency. Get help right away. Call 911. Do not wait to see if the symptoms will go away. Do not drive yourself to the hospital. Summary After the procedure, it is common to have a sore throat, hoarseness, nausea, vomiting, or to feel weak, sleepy, or fatigue. For the time period you were told by your health care provider, do not drive or use machinery. Get help right away if you have difficulty breathing, have chest pain, or vomit blood. These symptoms may be an emergency. This information is not intended to replace advice given to you by your health care provider. Make sure you discuss any questions you have with your health care provider. Document Revised: 05/02/2021 Document Reviewed: 05/02/2021 Elsevier Patient Education  2024 [employer].

## 2023-05-03 NOTE — Telephone Encounter (Signed)
 Checking percert on the following patient for testing scheduled at [facility].   DCCV scheduled for Friday, 05/07/19 at 9:00 am - [place]

## 2023-05-04 ENCOUNTER — Telehealth: Payer: Self-pay

## 2023-05-04 DIAGNOSIS — I4819 Other persistent atrial fibrillation: Secondary | ICD-10-CM

## 2023-05-04 NOTE — Telephone Encounter (Signed)
 Pt is scheduled for Afib Ablation with Dr. Nelly Laurence on 5/30 at 10:00 am.  CT and labs have been ordered.  Pt is aware someone will be calling her to schedule her CT scan and also an appt in [place] to see Dr. Nelly Laurence prior to her Ablation.  Pt would like her Instructions mailed to her once completed. She does not use MyChart.

## 2023-05-04 NOTE — Telephone Encounter (Signed)
 LM for pt to call back to schedule Ablation with Dr. Nelly Laurence

## 2023-05-05 ENCOUNTER — Encounter (HOSPITAL_COMMUNITY): Payer: Self-pay

## 2023-05-05 ENCOUNTER — Encounter (HOSPITAL_COMMUNITY)
Admission: RE | Admit: 2023-05-05 | Discharge: 2023-05-05 | Disposition: A | Discharge status: Home / Self Care | Source: Ambulatory Visit | Attending: Cardiology | Admitting: Cardiology

## 2023-05-05 VITALS — BP 120/62 | HR 91 | Temp 98.0°F | Resp 18 | Ht 63.0 in | Wt 150.2 lb

## 2023-05-05 DIAGNOSIS — D751 Secondary polycythemia: Secondary | ICD-10-CM | POA: Insufficient documentation

## 2023-05-05 DIAGNOSIS — Z01812 Encounter for preprocedural laboratory examination: Secondary | ICD-10-CM | POA: Diagnosis not present

## 2023-05-05 DIAGNOSIS — I10 Essential (primary) hypertension: Secondary | ICD-10-CM | POA: Diagnosis not present

## 2023-05-05 DIAGNOSIS — I480 Paroxysmal atrial fibrillation: Secondary | ICD-10-CM | POA: Insufficient documentation

## 2023-05-05 HISTORY — DX: Other specified postprocedural states: R11.2

## 2023-05-05 HISTORY — DX: Other specified postprocedural states: Z98.890

## 2023-05-05 HISTORY — DX: Nausea with vomiting, unspecified: R11.2

## 2023-05-05 LAB — BASIC METABOLIC PANEL
Anion gap: 12 (ref 5–15)
BUN: 18 mg/dL (ref 8–23)
CO2: 23 mmol/L (ref 22–32)
Calcium: 9.4 mg/dL (ref 8.9–10.3)
Chloride: 104 mmol/L (ref 98–111)
Creatinine, Ser: 0.81 mg/dL (ref 0.44–1.00)
GFR, Estimated: >60 mL/min (ref >60)
Glucose, Bld: 234 mg/dL — ABNORMAL HIGH (ref 70–99)
Potassium: 4 mmol/L (ref 3.5–5.1)
Sodium: 139 mmol/L (ref 135–145)

## 2023-05-05 LAB — CBC WITH DIFFERENTIAL/PLATELET
Abs Immature Granulocytes: 0.01 10*3/uL (ref 0.00–0.07)
Basophils Absolute: 0.1 10*3/uL (ref 0.0–0.1)
Basophils Relative: 1 %
Eosinophils Absolute: 0.2 10*3/uL (ref 0.0–0.5)
Eosinophils Relative: 2 %
HCT: 50.7 % — ABNORMAL HIGH (ref 36.0–46.0)
Hemoglobin: 16.3 g/dL — ABNORMAL HIGH (ref 12.0–15.0)
Immature Granulocytes: 0 %
Lymphocytes Relative: 15 %
Lymphs Abs: 1 10*3/uL (ref 0.7–4.0)
MCH: 30.3 pg (ref 26.0–34.0)
MCHC: 32.1 g/dL (ref 30.0–36.0)
MCV: 94.2 fL (ref 80.0–100.0)
Monocytes Absolute: 0.6 10*3/uL (ref 0.1–1.0)
Monocytes Relative: 8 %
Neutro Abs: 5.1 10*3/uL (ref 1.7–7.7)
Neutrophils Relative %: 74 %
Platelets: 203 10*3/uL (ref 150–400)
RBC: 5.38 MIL/uL — ABNORMAL HIGH (ref 3.87–5.11)
RDW: 13.2 % (ref 11.5–15.5)
WBC: 6.9 10*3/uL (ref 4.0–10.5)
nRBC: 0 % (ref 0.0–0.2)

## 2023-05-06 NOTE — Progress Notes (Signed)
 Talked with pt. Informed to arrive at 0830 for 1030 procedure. Voiced understanding.

## 2023-05-07 ENCOUNTER — Ambulatory Visit (HOSPITAL_COMMUNITY): Admitting: Certified Registered Nurse Anesthetist

## 2023-05-07 ENCOUNTER — Ambulatory Visit (HOSPITAL_COMMUNITY)
Admission: RE | Admit: 2023-05-07 | Discharge: 2023-05-07 | Disposition: A | Discharge status: Home / Self Care | Attending: Cardiology | Admitting: Cardiology

## 2023-05-07 ENCOUNTER — Encounter (HOSPITAL_COMMUNITY)
Admission: RE | Disposition: A | Discharge status: Home / Self Care | Payer: Self-pay | Source: Home / Self Care | Attending: Cardiology

## 2023-05-07 ENCOUNTER — Other Ambulatory Visit: Payer: Self-pay

## 2023-05-07 ENCOUNTER — Encounter (HOSPITAL_COMMUNITY): Payer: Self-pay | Admitting: Cardiology

## 2023-05-07 DIAGNOSIS — E119 Type 2 diabetes mellitus without complications: Secondary | ICD-10-CM

## 2023-05-07 DIAGNOSIS — I2510 Atherosclerotic heart disease of native coronary artery without angina pectoris: Secondary | ICD-10-CM | POA: Diagnosis not present

## 2023-05-07 DIAGNOSIS — Z950 Presence of cardiac pacemaker: Secondary | ICD-10-CM | POA: Diagnosis not present

## 2023-05-07 DIAGNOSIS — D6869 Other thrombophilia: Secondary | ICD-10-CM | POA: Diagnosis not present

## 2023-05-07 DIAGNOSIS — I484 Atypical atrial flutter: Secondary | ICD-10-CM | POA: Insufficient documentation

## 2023-05-07 DIAGNOSIS — I10 Essential (primary) hypertension: Secondary | ICD-10-CM

## 2023-05-07 DIAGNOSIS — I4819 Other persistent atrial fibrillation: Secondary | ICD-10-CM | POA: Diagnosis not present

## 2023-05-07 DIAGNOSIS — Z79899 Other long term (current) drug therapy: Secondary | ICD-10-CM | POA: Diagnosis not present

## 2023-05-07 DIAGNOSIS — Z7901 Long term (current) use of anticoagulants: Secondary | ICD-10-CM | POA: Insufficient documentation

## 2023-05-07 DIAGNOSIS — E1165 Type 2 diabetes mellitus with hyperglycemia: Secondary | ICD-10-CM | POA: Insufficient documentation

## 2023-05-07 HISTORY — PX: CARDIOVERSION: SHX1299

## 2023-05-07 LAB — GLUCOSE, CAPILLARY: Glucose-Capillary: 133 mg/dL — ABNORMAL HIGH (ref 70–99)

## 2023-05-07 LAB — BASIC METABOLIC PANEL
Anion gap: 11 (ref 5–15)
BUN: 17 mg/dL (ref 8–23)
CO2: 24 mmol/L (ref 22–32)
Calcium: 9.4 mg/dL (ref 8.9–10.3)
Chloride: 104 mmol/L (ref 98–111)
Creatinine, Ser: 0.67 mg/dL (ref 0.44–1.00)
GFR, Estimated: >60 mL/min (ref >60)
Glucose, Bld: 158 mg/dL — ABNORMAL HIGH (ref 70–99)
Potassium: 3.9 mmol/L (ref 3.5–5.1)
Sodium: 139 mmol/L (ref 135–145)

## 2023-05-07 LAB — CBC
HCT: 50.2 % — ABNORMAL HIGH (ref 36.0–46.0)
Hemoglobin: 16.7 g/dL — ABNORMAL HIGH (ref 12.0–15.0)
MCH: 31.2 pg (ref 26.0–34.0)
MCHC: 33.3 g/dL (ref 30.0–36.0)
MCV: 93.7 fL (ref 80.0–100.0)
Platelets: 223 10*3/uL (ref 150–400)
RBC: 5.36 MIL/uL — ABNORMAL HIGH (ref 3.87–5.11)
RDW: 13.5 % (ref 11.5–15.5)
WBC: 8 10*3/uL (ref 4.0–10.5)
nRBC: 0 % (ref 0.0–0.2)

## 2023-05-07 SURGERY — CARDIOVERSION
Anesthesia: General

## 2023-05-07 MED ORDER — PROPOFOL 10 MG/ML IV BOLUS
INTRAVENOUS | Status: DC | PRN
Start: 2023-05-07 — End: 2023-05-07
  Administered 2023-05-07: 70 mg via INTRAVENOUS

## 2023-05-07 MED ORDER — SODIUM CHLORIDE 0.9 % IV SOLN
INTRAVENOUS | Status: DC
Start: 2023-05-07 — End: 2023-05-07

## 2023-05-07 MED ORDER — SODIUM CHLORIDE 0.9% FLUSH: 3.0000 mL | INTRAVENOUS | Status: DC | PRN

## 2023-05-07 MED ORDER — SODIUM CHLORIDE 0.9% FLUSH
3.0000 mL | Freq: Two times a day (BID) | INTRAVENOUS | Status: DC
Start: 2023-05-07 — End: 2023-05-07

## 2023-05-07 SURGICAL SUPPLY — 2 items
GLOVE BIOGEL PI IND STRL 7.0 (GLOVE) ×2 IMPLANT
POSITIONER HEAD 8X9X4 ADT (SOFTGOODS) ×1 IMPLANT

## 2023-05-07 NOTE — Transfer of Care (Signed)
 Immediate Anesthesia Transfer of Care Note  Patient: [name] [name]  Procedure(s) Performed: CARDIOVERSION  Patient Location: PACU  Anesthesia Type:MAC  Level of Consciousness: awake  Airway & Oxygen Therapy: Patient Spontanous Breathing  Post-op Assessment: Report given to RN  Post vital signs: Reviewed and stable  Last Vitals:  Vitals Value Taken Time  BP    Temp    Pulse    Resp    SpO2      Last Pain:  Vitals:   05/07/23 0850  TempSrc: Oral  PainSc: 0-No pain         Complications: No notable events documented.

## 2023-05-07 NOTE — Anesthesia Postprocedure Evaluation (Signed)
 Anesthesia Post Note  Patient: [name] [name]  Procedure(s) Performed: CARDIOVERSION  Patient location during evaluation: PACU Anesthesia Type: General Level of consciousness: awake and alert Pain management: pain level controlled Vital Signs Assessment: post-procedure vital signs reviewed and stable Respiratory status: spontaneous breathing, nonlabored ventilation, respiratory function stable and patient connected to nasal cannula oxygen Cardiovascular status: blood pressure returned to baseline and stable Postop Assessment: no apparent nausea or vomiting Anesthetic complications: no   There were no known notable events for this encounter.   Last Vitals:  Vitals:   05/07/23 1215 05/07/23 1230  BP: (!) 130/57 (!) 129/58  Pulse: 62 74  Resp: (!) 21 (!) 22  Temp:    SpO2: 91% 93%    Last Pain:  Vitals:   05/07/23 1200  TempSrc:   PainSc: 0-No pain                 Durell Lofaso L Ovie Cornelio

## 2023-05-07 NOTE — Interval H&P Note (Signed)
 History and Physical Interval Note:  05/07/2023 8:31 AM  [name] [name]  has presented today for surgery, with the diagnosis of a-fib.  The various methods of treatment have been discussed with the patient and family. After consideration of risks, benefits and other options for treatment, the patient has consented to  Procedure(s) with comments: CARDIOVERSION (N/A) - pt notified to arrive at 0830 for 1030 procedure. Voiced understanding. as a surgical intervention.  The patient's history has been reviewed, patient examined, no change in status, stable for surgery.  I have reviewed the patient's chart and labs.  Questions were answered to the patient's satisfaction.     Nona Dell

## 2023-05-07 NOTE — Progress Notes (Signed)
 Electrical Cardioversion Procedure Note [name] [name] [id number] [birth year]  Procedure: Electrical Cardioversion Indications:  Atrial Fibrillation  Procedure Details Consent: Risks of procedure as well as the alternatives and risks of each were explained to the (patient/caregiver).  Consent for procedure obtained. Time Out: Verified patient identification, verified procedure, site/side was marked, verified correct patient position, special equipment/implants available, medications/allergies/relevent history reviewed, required imaging and test results available. Time out performed at 1116.  Patient placed on cardiac monitor, pulse oximetry, supplemental oxygen as necessary.  Sedation given:  Propofol Pacer pads placed anterior and posterior chest.  Cardioverted 2 time(s).  Cardioverted at 120J. Cardioverted at 150J.  Evaluation Findings: Post procedure EKG shows:  Atrial paced. Complications: None Patient did tolerate procedure well.   [name] [name] 05/07/2023, 12:02 PM

## 2023-05-07 NOTE — CV Procedure (Signed)
 Elective direct-current cardioversion  Indication: Persistent atrial fibrillation/atypical atrial flutter  Description of procedure: Patient was taken to the PACU, informed consent obtained and timeout performed.  Anterior and posterior pads were placed in standard fashion and connected to a biphasic defibrillator.  Deep sedation was achieved via the anesthesia service, total of lidocaine 25 mg and propofol 70 mg.  With sandbag on the anterior chest pad, a single, synchronized 120 J shock was delivered initially resulting in brief atrial paced rhythm and then return to an atypical atrial flutter.  A second, synchronized 150 J shock was then delivered with successful restoration of an atrial paced rhythm confirmed by follow-up ECG.  Patient remained hemodynamically stable throughout and there were no immediate complications.  Disposition: After appropriate postprocedure observation she will be discharged home with her daughter.  Arranging follow-up with Dr. Nelly Laurence for further discussion regarding ablation.  Jonelle Sidle, M.D., F.A.C.C.

## 2023-05-07 NOTE — Anesthesia Preprocedure Evaluation (Addendum)
 Anesthesia Evaluation  Patient identified by MRN, date of birth, ID band Patient awake    Reviewed: Allergy & Precautions, H&P , NPO status , Patient's Chart, lab work & pertinent test results, reviewed documented beta blocker date and time   History of Anesthesia Complications (+) PONV and history of anesthetic complications  Airway Mallampati: II  TM Distance: >3 FB Neck ROM: full    Dental  (+) Edentulous Upper, Missing,    Pulmonary neg pulmonary ROS   Pulmonary exam normal breath sounds clear to auscultation       Cardiovascular Exercise Tolerance: Good hypertension, + CAD  Normal cardiovascular exam+ dysrhythmias Atrial Fibrillation + pacemaker  Rhythm:regular Rate:Normal     Neuro/Psych negative neurological ROS  negative psych ROS   GI/Hepatic negative GI ROS, Neg liver ROS,,,  Endo/Other  diabetes, Poorly Controlled, Type 2    Renal/GU negative Renal ROS  negative genitourinary   Musculoskeletal   Abdominal   Peds  Hematology negative hematology ROS (+)   Anesthesia Other Findings   Reproductive/Obstetrics negative OB ROS                             Anesthesia Physical Anesthesia Plan  ASA: 3  Anesthesia Plan: General   Post-op Pain Management: Minimal or no pain anticipated   Induction: Intravenous  PONV Risk Score and Plan: Propofol infusion  Airway Management Planned: Nasal Cannula and Natural Airway  Additional Equipment: None  Intra-op Plan:   Post-operative Plan:   Informed Consent: I have reviewed the patients History and Physical, chart, labs and discussed the procedure including the risks, benefits and alternatives for the proposed anesthesia with the patient or authorized representative who has indicated his/her understanding and acceptance.     Dental Advisory Given  Plan Discussed with: CRNA  Anesthesia Plan Comments:        Anesthesia  Quick Evaluation

## 2023-05-08 ENCOUNTER — Encounter (HOSPITAL_COMMUNITY): Payer: Self-pay | Admitting: Cardiology

## 2023-05-17 ENCOUNTER — Telehealth: Payer: Self-pay | Admitting: Cardiology

## 2023-05-17 NOTE — Telephone Encounter (Signed)
 Pt c/o swelling/edema: STAT if pt has developed SOB within 24 hours  If swelling, where is the swelling located? Both legs but left is swelling more than the right. Located below calf down to ankles  How much weight have you gained and in what time span? Not sure  Have you gained 2 pounds in a day or 5 pounds in a week? Not sure  Do you have a log of your daily weights (if so, list)? no  Are you currently taking a fluid pill? Yes, as needed  Are you currently SOB? no  Have you traveled recently in a car or plane for an extended period of time?

## 2023-05-17 NOTE — Telephone Encounter (Signed)
 Patient informed and verbalized understanding of plan. Patient will reach out to [place] on Wednesday or Thursday this week to let us know if the increased dosage has helped and will determine either to increase permanently

## 2023-05-17 NOTE — Telephone Encounter (Signed)
 Patient states swelling is in lower calves and ankles most of the swelling occurs in the after noon Taking lasix 20 as needed for swelling patient states she has been taking it everyday for the last 2-3 weeks and the swelling is still there.  Patient has no scale at home to weigh herself  Patient is not having any SOB.

## 2023-05-20 ENCOUNTER — Other Ambulatory Visit: Payer: Self-pay | Admitting: Cardiology

## 2023-05-20 DIAGNOSIS — I4819 Other persistent atrial fibrillation: Secondary | ICD-10-CM

## 2023-05-20 NOTE — Telephone Encounter (Signed)
 Prescription refill request for Xarelto received.  Indication: Afib  Last office visit: 04/23/23 ([facility])  Weight: 68.1kg Age: 78 Scr: 0.67 (05/07/23)  CrCl: 41ml/min  Appropriate dose. Refill sent.

## 2023-05-25 DIAGNOSIS — H43393 Other vitreous opacities, bilateral: Secondary | ICD-10-CM | POA: Diagnosis not present

## 2023-05-25 DIAGNOSIS — H524 Presbyopia: Secondary | ICD-10-CM | POA: Diagnosis not present

## 2023-05-25 NOTE — Progress Notes (Signed)
 Remote pacemaker transmission.

## 2023-05-25 NOTE — Addendum Note (Signed)
 Addended by: Elease Etienne A on: 05/25/2023 02:51 PM   Modules accepted: Orders

## 2023-05-28 ENCOUNTER — Ambulatory Visit: Attending: Cardiovascular Disease | Admitting: Cardiovascular Disease

## 2023-05-28 NOTE — Progress Notes (Deleted)
      HPI Mrs. Weeks returns today for followup. She is a pleasant 78 yo woman with symptomatic tachy-brady syndrome s/p PPM by Dr. Fawn Kirk over 11 years ago. She has rare palpitations. She has been placed on dofetilide and her atrial fib has been fairly well controlled.   She underwent generator change by dr. Ladona Ridgel in September, 2023.  Her atrial fibrillation has been managed with dofetilide.  She has continued to have episodes of atrial high rates detected by her device.  She does not have palpitations but she does complain of episodic fatigue.  Today in follow-up (April 23, 2023), she notes significant increased and fatigue.  This began in December and has been persistent.  Her quality of life and ability to perform ADLs has been affected.   There were no vitals taken for this visit.  Physical Exam:  Well appearing NAD Lungs: Normal work of breathing HEART:  Regular rate rhythm, no murmurs, no rubs, no clicks CHEST: The device site is normal -- no tenderness, edema, drainage, redness, threatened erosion. Ext:  2 plus pulses, no edema, no cyanosis, no clubbing   DEVICE  Normal device function.  See PaceArt for details.   EKG     Assess/Plan:   Sinus node dysfunction  she is asymptomatic s/p PPM insertion.  She is atrial pacing about 60% of the time.   PAF   Failed Tikosyn due to Qtc prolongation, failed amiodarone -- now in flutter History of fibrillation and flutter --onset of persistent A-fib/flutter correlates with severe fatigue We discussed management options which would include ablation now that she has failed amiodarone or continued rate control.  She would prefer to proceed with ablation. Will refer for cardioversion.  Continue amiodarone pending ablation Will see prior to ablation to assess effects of cardioversion.  We discussed the indication, rationale, logistics, anticipated benefits, and potential risks of the ablation procedure including but not limited to --  bleed at the groin access site, chest pain, damage to nearby organs such as the diaphragm, lungs, or esophagus, need for a drainage tube, or prolonged hospitalization. I explained that the risk for stroke, heart attack, need for open chest surgery, or even death is very low but not zero. she  expressed understanding and wishes to proceed.   Secondary hypercoagulable state Continue xarelto 20mg  Check CBC  CAD  she denies anginal symptoms.  HTN  her bp is well controlled.  No change in her meds.  Follow-up 2 months  Maurice Small, MD

## 2023-05-31 ENCOUNTER — Encounter: Payer: Self-pay | Admitting: Cardiovascular Disease

## 2023-06-07 ENCOUNTER — Encounter: Payer: Self-pay | Admitting: Cardiovascular Disease

## 2023-06-07 ENCOUNTER — Ambulatory Visit: Attending: Cardiovascular Disease | Admitting: Cardiovascular Disease

## 2023-06-07 VITALS — BP 120/68 | HR 70 | Ht 63.0 in | Wt 148.2 lb

## 2023-06-07 DIAGNOSIS — I495 Sick sinus syndrome: Secondary | ICD-10-CM | POA: Diagnosis not present

## 2023-06-07 DIAGNOSIS — I4819 Other persistent atrial fibrillation: Secondary | ICD-10-CM

## 2023-06-07 LAB — CUP PACEART INCLINIC DEVICE CHECK
Date Time Interrogation Session: 20250421164010
Implantable Lead Connection Status: 753985
Implantable Lead Connection Status: 753985
Implantable Lead Implant Date: 20110510
Implantable Lead Implant Date: 20110510
Implantable Lead Location: 753859
Implantable Lead Location: 753860
Implantable Lead Model: 5076
Implantable Lead Model: 5092
Implantable Pulse Generator Implant Date: 20230901

## 2023-06-07 NOTE — Patient Instructions (Signed)
 Medication Instructions:  Your physician recommends that you continue on your current medications as directed. Please refer to the Current Medication list given to you today. *If you need a refill on your cardiac medications before your next appointment, please call your pharmacy*  Lab Work: CBC and BMET - please have pre-procedure labs completed on Wednesday, April 30 (or a day or two after is fine as well) This can be done at ANY [facility] near you - no appointment required and this does not have to be fasting If you have labs (blood work) drawn today and your tests are completely normal, you will receive your results only by: MyChart Message (if you have MyChart) OR A paper copy in the mail If you have any lab test that is abnormal or we need to change your treatment, we will call you to review the results.  Testing/Procedures: Ablation scheduled for 07/16/23 - we will be in contact with you closer to time with further details Your physician has recommended that you have an ablation. Catheter ablation is a medical procedure used to treat some cardiac arrhythmias (irregular heartbeats). During catheter ablation, a long, thin, flexible tube is put into a blood vessel in your groin (upper thigh), or neck. This tube is called an ablation catheter. It is then guided to your heart through the blood vessel. Radio frequency waves destroy small areas of heart tissue where abnormal heartbeats may cause an arrhythmia to start. Please see the instruction sheet given to you today.  Follow-Up: At [facility], you and your health needs are our priority.  As part of our continuing mission to provide you with exceptional heart care, our providers are all part of one team.  This team includes your primary Cardiologist (physician) and Advanced Practice Providers or APPs (Physician Assistants and Nurse Practitioners) who all work together to provide you with the care you need, when you need it.  Your next  appointment:   We will schedule follow up after your ablation   Provider:   Marlane Silver, MD      1st Floor: - Lobby - Registration  - Pharmacy  - Lab - Cafe  2nd Floor: - PV Lab - Diagnostic Testing (echo, CT, nuclear med)  3rd Floor: - Vacant  4th Floor: - TCTS (cardiothoracic surgery) - AFib Clinic - Structural Heart Clinic - Vascular Surgery  - Vascular Ultrasound  5th Floor: - HeartCare Cardiology (general and EP) - Clinical Pharmacy for coumadin, hypertension, lipid, weight-loss medications, and med management appointments    Valet parking services will be available as well.

## 2023-06-07 NOTE — Progress Notes (Signed)
      HPI Mrs. Weeks returns today for followup. She is a pleasant 78 yo woman with symptomatic tachy-brady syndrome s/p PPM by Dr. Joenathan Muslim over 11 years ago. She has rare palpitations. She has been placed on dofetilide  and her atrial fib has been fairly well controlled.   She underwent generator change by dr. Carolynne Citron in September, 2023.  Her atrial fibrillation has been managed with dofetilide .  She has continued to have episodes of atrial high rates detected by her device.  She does not have palpitations but she does complain of episodic fatigue.  Today in follow-up (April 23, 2023), she notes significant increased and fatigue.  This began in December and has been persistent.  Her quality of life and ability to perform ADLs has been affected.  She underwent DC cardioversion while on amiodarone  but appears to have had fairly rapid recurrence of atrial fibrillation.   BP 120/68 (BP Location: Left Arm, Patient Position: Sitting, Cuff Size: Normal)   Pulse 70   Ht 5\' 3"  (1.6 m)   Wt 148 lb 3.2 oz (67.2 kg)   SpO2 96%   BMI 26.25 kg/m   Physical Exam:  Well appearing NAD Lungs: Normal work of breathing HEART:  Regular rate rhythm, no murmurs, no rubs, no clicks CHEST: The device site is normal -- no tenderness, edema, drainage, redness, threatened erosion. Ext:  2 plus pulses, no edema, no cyanosis, no clubbing   DEVICE  Normal device function.  See PaceArt for details.   EKG     Assess/Plan:   Sinus node dysfunction  she is asymptomatic s/p PPM insertion.  She is atrial pacing about 60% of the time.   PAF   Failed Tikosyn  due to Qtc prolongation, failed amiodarone  -- now in flutter History of fibrillation and flutter --onset of persistent A-fib/flutter correlates with severe fatigue We discussed management options which would include ablation now that she has failed amiodarone  or continued rate control.  She would prefer to proceed with ablation.  We have discussed the  indication, rationale, logistics, anticipated benefits, and potential risks of the ablation procedure including but not limited to -- bleed at the groin access site, chest pain, damage to nearby organs such as the diaphragm, lungs, or esophagus, need for a drainage tube, or prolonged hospitalization. I explained that the risk for stroke, heart attack, need for open chest surgery, or even death is very low but not zero. she  expressed understanding and wishes to proceed.   Secondary hypercoagulable state Continue xarelto  20mg  Check CBC  CAD  she denies anginal symptoms.  HTN  her bp is well controlled.  No change in her meds.  Follow-up 2 months  Efraim Grange, MD

## 2023-06-08 ENCOUNTER — Other Ambulatory Visit: Payer: Self-pay | Admitting: Cardiology

## 2023-06-15 MED ORDER — FUROSEMIDE 20 MG PO TABS
20.0000 mg | ORAL_TABLET | ORAL | 3 refills | Status: DC
Start: 2023-06-15 — End: 2023-07-07

## 2023-06-15 NOTE — Telephone Encounter (Signed)
 Pt has been taking the 40 mgs but left ankle is still swollen. She is out of 40 mgs does she keep taking the 40 mg or what should she do

## 2023-06-15 NOTE — Telephone Encounter (Signed)
 Patient informed and verbalized understanding of plan.

## 2023-06-15 NOTE — Telephone Encounter (Signed)
 Reports 40 mg furosemide  daily did improve the swelling. Reports she has continued taking 40 mg lasix  daily and apologize that she is just returning call for follow up. Confirmed that she does take potassium 20 meq every other day. Reports no recent lab work done by PCP.

## 2023-06-17 ENCOUNTER — Telehealth: Payer: Self-pay

## 2023-06-17 ENCOUNTER — Other Ambulatory Visit: Payer: Self-pay

## 2023-06-17 DIAGNOSIS — I480 Paroxysmal atrial fibrillation: Secondary | ICD-10-CM

## 2023-06-17 DIAGNOSIS — Z01812 Encounter for preprocedural laboratory examination: Secondary | ICD-10-CM

## 2023-06-17 DIAGNOSIS — I495 Sick sinus syndrome: Secondary | ICD-10-CM

## 2023-06-17 NOTE — Telephone Encounter (Signed)
 Spoke to pt and went over CT and Ablation instructions. She is aware of where to go and what time to be there for both.  She will go to [facility] in [place] May 5-7 for labs.  I have mailed instruction letters to patient.

## 2023-06-21 ENCOUNTER — Telehealth (HOSPITAL_COMMUNITY): Payer: Self-pay

## 2023-06-21 NOTE — Telephone Encounter (Signed)
 Spoke with patient to complete pre-procedure call.     New medical conditions? No Recent hospitalizations or surgeries? No Started any new medications? Furosemide   Patient made aware to contact office to inform of any new medications started. Any changes in activities of daily living? No  Pre-procedure testing scheduled: Lab work to be completed by 06/22/23 and CT on 06/25/23 . Reviewed CT instructions with patient. She voiced understanding.   Confirmed patient is taking Xarelto  daily and will continue taking medication before procedure or it may need to be rescheduled.  Confirmed patient is scheduled for Atrial Fibrillation Ablation on Friday, May 30 with Dr. Marlane Silver. Instructed patient to arrive at the Main Entrance A at [facility]: [address] [place], [place] [address] and check in at Admitting at 8:00 AM.   Advised of plan to go home the same day and will only stay overnight if medically necessary. You MUST have a responsible adult to drive you home and MUST be with you the first 24 hours after you arrive home or your procedure could be cancelled.  Patient verbalized understanding to information provided and is agreeable to proceed with procedure.

## 2023-06-22 DIAGNOSIS — I4819 Other persistent atrial fibrillation: Secondary | ICD-10-CM | POA: Diagnosis not present

## 2023-06-23 LAB — BASIC METABOLIC PANEL WITH GFR
BUN/Creatinine Ratio: 18 (ref 12–28)
BUN: 17 mg/dL (ref 8–27)
CO2: 22 mmol/L (ref 20–29)
Calcium: 9.6 mg/dL (ref 8.7–10.3)
Chloride: 103 mmol/L (ref 96–106)
Creatinine, Ser: 0.92 mg/dL (ref 0.57–1.00)
Glucose: 186 mg/dL — ABNORMAL HIGH (ref 70–99)
Potassium: 4.4 mmol/L (ref 3.5–5.2)
Sodium: 143 mmol/L (ref 134–144)
eGFR: 63 mL/min/{1.73_m2} (ref >59)

## 2023-06-23 LAB — CBC
Hematocrit: 53 % — ABNORMAL HIGH (ref 34.0–46.6)
Hemoglobin: 17.5 g/dL — ABNORMAL HIGH (ref 11.1–15.9)
MCH: 30.6 pg (ref 26.6–33.0)
MCHC: 33 g/dL (ref 31.5–35.7)
MCV: 93 fL (ref 79–97)
Platelets: 224 10*3/uL (ref 150–450)
RBC: 5.71 x10E6/uL — ABNORMAL HIGH (ref 3.77–5.28)
RDW: 12.3 % (ref 11.7–15.4)
WBC: 7.5 10*3/uL (ref 3.4–10.8)

## 2023-06-25 ENCOUNTER — Ambulatory Visit (HOSPITAL_COMMUNITY)
Admission: RE | Admit: 2023-06-25 | Discharge: 2023-06-25 | Disposition: A | Discharge status: Home / Self Care | Source: Ambulatory Visit | Attending: Cardiovascular Disease

## 2023-06-25 DIAGNOSIS — I4819 Other persistent atrial fibrillation: Secondary | ICD-10-CM | POA: Diagnosis not present

## 2023-06-25 DIAGNOSIS — I700 Atherosclerosis of aorta: Secondary | ICD-10-CM | POA: Diagnosis not present

## 2023-06-25 MED ORDER — IOHEXOL 350 MG/ML SOLN
100.0000 mL | Freq: Once | INTRAVENOUS | Status: AC | PRN
  Administered 2023-06-25: 100 mL via INTRAVENOUS

## 2023-07-02 ENCOUNTER — Telehealth: Payer: Self-pay | Admitting: Cardiology

## 2023-07-02 NOTE — Telephone Encounter (Signed)
 Pt c/o medication issue:  1. Name of Medication: furosemide  (LASIX ) 20 MG tablet   2. How are you currently taking this medication (dosage and times per day)? As written  3. Are you having a reaction (difficulty breathing--STAT)? No  4. What is your medication issue? Pt was told to take this medication to control the edema but the pt doesn't think its helping her.

## 2023-07-04 NOTE — Pre-Procedure Instructions (Signed)
 Spoke with patient regarding procedure on 5/30 with anesthesia.  She is aware that anesthesia requires Mounjaro to be held for 7 days prior to procedure.  She takes on Wednesdays, she knows she can take this Wednesday 5/21, but to not take the week of procedure.

## 2023-07-05 NOTE — Telephone Encounter (Signed)
 Spoke with patient - c/o ankles swelling, left more than the right.  Denies any SOB, chest pain, dizziness or weight gain (does not have scales at home).  Was initially taking Furosemide  40mg  alternating with 20mg  every other day.  States she has been taking 40mg  daily for the last week.  Patient is pending ablation on 07/16/2023.

## 2023-07-05 NOTE — Telephone Encounter (Signed)
 Please arrange for [name] [name] to be evaluated in clinic this week to better evaluate her edema and determine if additional testing/imaging is warranted.

## 2023-07-06 NOTE — Telephone Encounter (Signed)
 Left message to return call

## 2023-07-06 NOTE — Telephone Encounter (Signed)
 Patient notified and verbalized understanding.  Appointment scheduled for tomorrow with Lasalle Pointer, NP in [place].

## 2023-07-07 ENCOUNTER — Ambulatory Visit: Attending: Nurse Practitioner

## 2023-07-07 ENCOUNTER — Ambulatory Visit: Attending: Nurse Practitioner | Admitting: Nurse Practitioner

## 2023-07-07 ENCOUNTER — Telehealth: Payer: Self-pay | Admitting: Nurse Practitioner

## 2023-07-07 ENCOUNTER — Encounter: Payer: Self-pay | Admitting: Nurse Practitioner

## 2023-07-07 VITALS — BP 136/68 | HR 69 | Ht 63.0 in | Wt 152.0 lb

## 2023-07-07 DIAGNOSIS — R609 Edema, unspecified: Secondary | ICD-10-CM

## 2023-07-07 DIAGNOSIS — R600 Localized edema: Secondary | ICD-10-CM

## 2023-07-07 DIAGNOSIS — Z950 Presence of cardiac pacemaker: Secondary | ICD-10-CM

## 2023-07-07 DIAGNOSIS — R5383 Other fatigue: Secondary | ICD-10-CM | POA: Diagnosis not present

## 2023-07-07 DIAGNOSIS — I2510 Atherosclerotic heart disease of native coronary artery without angina pectoris: Secondary | ICD-10-CM

## 2023-07-07 DIAGNOSIS — I495 Sick sinus syndrome: Secondary | ICD-10-CM | POA: Diagnosis not present

## 2023-07-07 DIAGNOSIS — E1165 Type 2 diabetes mellitus with hyperglycemia: Secondary | ICD-10-CM | POA: Diagnosis not present

## 2023-07-07 DIAGNOSIS — I10 Essential (primary) hypertension: Secondary | ICD-10-CM

## 2023-07-07 DIAGNOSIS — I480 Paroxysmal atrial fibrillation: Secondary | ICD-10-CM

## 2023-07-07 DIAGNOSIS — E559 Vitamin D deficiency, unspecified: Secondary | ICD-10-CM | POA: Diagnosis not present

## 2023-07-07 DIAGNOSIS — E785 Hyperlipidemia, unspecified: Secondary | ICD-10-CM | POA: Diagnosis not present

## 2023-07-07 DIAGNOSIS — I482 Chronic atrial fibrillation, unspecified: Secondary | ICD-10-CM | POA: Diagnosis not present

## 2023-07-07 LAB — LIPID PANEL
LDL Cholesterol: 73
Triglycerides: 92 (ref 40–160)

## 2023-07-07 LAB — HEPATIC FUNCTION PANEL: Alkaline Phosphatase: 129 — AB (ref 25–125)

## 2023-07-07 LAB — COMPREHENSIVE METABOLIC PANEL WITH GFR: eGFR: 61

## 2023-07-07 LAB — HEMOGLOBIN A1C: Hemoglobin A1C: 7.4

## 2023-07-07 LAB — VITAMIN D 25 HYDROXY (VIT D DEFICIENCY, FRACTURES): Vit D, 25-Hydroxy: 91.6

## 2023-07-07 LAB — TSH: TSH: 0.04 — AB (ref 0.41–5.90)

## 2023-07-07 LAB — BASIC METABOLIC PANEL WITH GFR
BUN: 19 (ref 4–21)
Creatinine: 0.9 (ref 0.5–1.1)

## 2023-07-07 MED ORDER — FUROSEMIDE 20 MG PO TABS: 60.0000 mg | ORAL_TABLET | Freq: Every day | ORAL | 3 refills | Status: DC

## 2023-07-07 MED ORDER — POTASSIUM CHLORIDE CRYS ER 20 MEQ PO TBCR: 20.0000 meq | EXTENDED_RELEASE_TABLET | Freq: Every day | ORAL | 2 refills | Status: DC

## 2023-07-07 NOTE — Telephone Encounter (Signed)
 Checking percert :  Echo scheduled in the [facility] today 07-08-2023

## 2023-07-07 NOTE — Patient Instructions (Addendum)
 Medication Instructions:   Your physician has recommended you make the following change in your medication:  Please Increase Lasix  to 60 Mg daily  Please Increase Potassium to 20 MeQ daily   Labwork: In 1 week at [facility]   Testing/Procedures: Your physician has requested that you have an echocardiogram. Echocardiography is a painless test that uses sound waves to create images of your heart. It provides your doctor with information about the size and shape of your heart and how well your heart's chambers and valves are working. This procedure takes approximately one hour. There are no restrictions for this procedure. Please do NOT wear cologne, perfume, aftershave, or lotions (deodorant is allowed). Please arrive 15 minutes prior to your appointment time.  Please note: We ask at that you not bring children with you during ultrasound (echo/ vascular) testing. Due to room size and safety concerns, children are not allowed in the ultrasound rooms during exams. Our front office staff cannot provide observation of children in our lobby area while testing is being conducted. An adult accompanying a patient to their appointment will only be allowed in the ultrasound room at the discretion of the ultrasound technician under special circumstances. We apologize for any inconvenience.  Follow-Up: Your physician recommends that you schedule a follow-up appointment in: 6 weeks   Any Other Special Instructions Will Be Listed Below (If Applicable).   If you need a refill on your cardiac medications before your next appointment, please call your pharmacy.

## 2023-07-07 NOTE — H&P (View-Only) (Signed)
 Cardiology Office Note:  .   Date:  07/07/2023 ID:  JOSEPHENE MARRONE, DOB 08-20-1945, MRN [id number] PCP: Alston Jerry, MD  [facility] Providers Cardiologist:  Teddie Favre, MD Electrophysiologist:  Efraim Grange, MD    History of Present Illness: .   [name] [name] is a 78 y.o. female with a PMH of PAF, CAD, hypertension, tachycardia-bradycardia syndrome, s/p PPM, who presents today for scheduled follow-up appointment.  She is followed by EP.  Last seen by Dr. Londa Rival on November 13, 2022.  She is overall doing well at the time.  Saw Dr. Arlester Ladd in April 2025.  Was noted she failed Tikosyn  due to QTc prolongation and failed amiodarone , was found to be in a flutter.  She noted severe fatigue.  Ablation was discussed with patient.  Contacted our office last week noting that Lasix  was prescribed previously to control leg edema, however it was felt that this medicine was not helping her.  Noted leg swelling on left more than right.  Was taking Lasix  40 mg daily and alternating with 20 mg every other day.  Has been taking 40 mg daily for the past week.  Was pending ablation on Jul 16, 2023. Odessa Bene, NP recommended for patient to be evaluated in the clinic to better evaluate her edema and determine if additional testing/imaging is warranted.  Today she presents for scheduled follow-up.  She states she has noticed intermittent edema recently, typically noted late in the afternoons. Current dosage of Lasix  at 40 mg daily has not been helping. Denies any chest pain, shortness of breath, palpitations, syncope, presyncope, dizziness, orthopnea, PND, or significant weight changes, acute bleeding, or claudication.   ROS: Negative. See HPI.   Studies Reviewed: Aaron Aas    EKG:  EKG Interpretation Date/Time:  Wednesday Jul 07 2023 10:59:29 EDT Ventricular Rate:  69 PR Interval:    QRS Duration:  186 QT Interval:  494 QTC Calculation: 529 R Axis:   -74  Text  Interpretation: Ventricular-paced rhythm When compared with ECG of 07-Jun-2023 14:22, No significant change was found Confirmed by Lasalle Pointer [phone]) on 07/13/2023 8:58:46 AM   CT cardiac 06/2023:   IMPRESSION: 1. There is normal variant pulmonary vein drainage into the left atrium. No pulmonary vein stenosis. 2. Normal left atrial appendage, no left atrial appendage thrombus. No intracardiac mass or thrombus. 3. The esophagus runs in the left atrial midline and is not in the proximity to any of the pulmonary veins.  IMPRESSION: Aortic Atherosclerosis (ICD10-I70.0) and Emphysema (ICD10-J43.9).   Small right pleural effusion.      Physical Exam:   VS:  BP 136/68 (BP Location: Right Arm, Cuff Size: Large)   Pulse 69   Ht 5\' 3"  (1.6 m)   Wt 152 lb (68.9 kg)   SpO2 96%   BMI 26.93 kg/m    Wt Readings from Last 3 Encounters:  07/07/23 152 lb (68.9 kg)  06/07/23 148 lb 3.2 oz (67.2 kg)  05/07/23 150 lb 3.2 oz (68.1 kg)    GEN: Well nourished, well developed in no acute distress NECK: No JVD; No carotid bruits CARDIAC: S1/S2, RRR, no murmurs, rubs, gallops RESPIRATORY:  Clear to auscultation without rales, wheezing or rhonchi  ABDOMEN: Soft, non-tender, non-distended EXTREMITIES:  No edema; No deformity   ASSESSMENT AND PLAN: .    Leg edema Notices intermittent leg edema despite taking her Lasix .  Will arrange echocardiogram and will increase Lasix  to 60 mg daily.  Will obtain lab work  in 1 week.  Will also increase her potassium supplement to 20 mill equivalent daily. Heart healthy diet and regular cardiovascular exercise encouraged.  Discussed leg elevation and compression stockings as needed.  She verbalized understanding.   PAF Denies any tachycardia or palpitations.  Heart rate is well-controlled today.  Continue amiodarone  and diltiazem .  Continue Xarelto  for stroke prevention.  She is on appropriate dosage denies any bleeding issues.  She is scheduled for upcoming  ablation with electrophysiology.  Continue follow-up with electrophysiology.  CAD Previous remote history of bare-metal stent to the circumflex in 2008. Stable with no anginal symptoms. No indication for ischemic evaluation.  She is not on aspirin due to being on Xarelto .  Continue losartan , pravastatin , and nitroglycerin  as needed. Care and ED precautions discussed.   HTN Blood pressure stable. Discussed to monitor BP at home at least 2 hours after medications and sitting for 5-10 minutes.  No medication changes at this time besides what is noted above.  Will be obtaining labwork in 1 week. Heart healthy diet and regular cardiovascular exercise encouraged.    Tachycardia-bradycardia syndrome, s/p PPM Denies any issues. This is being managed by EP-continue to follow-up with EP as scheduled.   Dispo: Follow-up with me/APP in 6 weeks or sooner if anything changes.  Signed, Lasalle Pointer, NP

## 2023-07-07 NOTE — Progress Notes (Unsigned)
 Cardiology Office Note:  .   Date:  07/07/2023 ID:  JOSEPHENE MARRONE, DOB 08-20-1945, MRN [id number] PCP: Alston Jerry, MD  [facility] Providers Cardiologist:  Teddie Favre, MD Electrophysiologist:  Efraim Grange, MD    History of Present Illness: .   [name] [name] is a 78 y.o. female with a PMH of PAF, CAD, hypertension, tachycardia-bradycardia syndrome, s/p PPM, who presents today for scheduled follow-up appointment.  She is followed by EP.  Last seen by Dr. Londa Rival on November 13, 2022.  She is overall doing well at the time.  Saw Dr. Arlester Ladd in April 2025.  Was noted she failed Tikosyn  due to QTc prolongation and failed amiodarone , was found to be in a flutter.  She noted severe fatigue.  Ablation was discussed with patient.  Contacted our office last week noting that Lasix  was prescribed previously to control leg edema, however it was felt that this medicine was not helping her.  Noted leg swelling on left more than right.  Was taking Lasix  40 mg daily and alternating with 20 mg every other day.  Has been taking 40 mg daily for the past week.  Was pending ablation on Jul 16, 2023. Odessa Bene, NP recommended for patient to be evaluated in the clinic to better evaluate her edema and determine if additional testing/imaging is warranted.  Today she presents for scheduled follow-up.  She states she has noticed intermittent edema recently, typically noted late in the afternoons. Current dosage of Lasix  at 40 mg daily has not been helping. Denies any chest pain, shortness of breath, palpitations, syncope, presyncope, dizziness, orthopnea, PND, or significant weight changes, acute bleeding, or claudication.   ROS: Negative. See HPI.   Studies Reviewed: Aaron Aas    EKG:  EKG Interpretation Date/Time:  Wednesday Jul 07 2023 10:59:29 EDT Ventricular Rate:  69 PR Interval:    QRS Duration:  186 QT Interval:  494 QTC Calculation: 529 R Axis:   -74  Text  Interpretation: Ventricular-paced rhythm When compared with ECG of 07-Jun-2023 14:22, No significant change was found Confirmed by Lasalle Pointer [phone]) on 07/13/2023 8:58:46 AM   CT cardiac 06/2023:   IMPRESSION: 1. There is normal variant pulmonary vein drainage into the left atrium. No pulmonary vein stenosis. 2. Normal left atrial appendage, no left atrial appendage thrombus. No intracardiac mass or thrombus. 3. The esophagus runs in the left atrial midline and is not in the proximity to any of the pulmonary veins.  IMPRESSION: Aortic Atherosclerosis (ICD10-I70.0) and Emphysema (ICD10-J43.9).   Small right pleural effusion.      Physical Exam:   VS:  BP 136/68 (BP Location: Right Arm, Cuff Size: Large)   Pulse 69   Ht 5\' 3"  (1.6 m)   Wt 152 lb (68.9 kg)   SpO2 96%   BMI 26.93 kg/m    Wt Readings from Last 3 Encounters:  07/07/23 152 lb (68.9 kg)  06/07/23 148 lb 3.2 oz (67.2 kg)  05/07/23 150 lb 3.2 oz (68.1 kg)    GEN: Well nourished, well developed in no acute distress NECK: No JVD; No carotid bruits CARDIAC: S1/S2, RRR, no murmurs, rubs, gallops RESPIRATORY:  Clear to auscultation without rales, wheezing or rhonchi  ABDOMEN: Soft, non-tender, non-distended EXTREMITIES:  No edema; No deformity   ASSESSMENT AND PLAN: .    Leg edema Notices intermittent leg edema despite taking her Lasix .  Will arrange echocardiogram and will increase Lasix  to 60 mg daily.  Will obtain lab work  in 1 week.  Will also increase her potassium supplement to 20 mill equivalent daily. Heart healthy diet and regular cardiovascular exercise encouraged.  Discussed leg elevation and compression stockings as needed.  She verbalized understanding.   PAF Denies any tachycardia or palpitations.  Heart rate is well-controlled today.  Continue amiodarone  and diltiazem .  Continue Xarelto  for stroke prevention.  She is on appropriate dosage denies any bleeding issues.  She is scheduled for upcoming  ablation with electrophysiology.  Continue follow-up with electrophysiology.  CAD Previous remote history of bare-metal stent to the circumflex in 2008. Stable with no anginal symptoms. No indication for ischemic evaluation.  She is not on aspirin due to being on Xarelto .  Continue losartan , pravastatin , and nitroglycerin  as needed. Care and ED precautions discussed.   HTN Blood pressure stable. Discussed to monitor BP at home at least 2 hours after medications and sitting for 5-10 minutes.  No medication changes at this time besides what is noted above.  Will be obtaining labwork in 1 week. Heart healthy diet and regular cardiovascular exercise encouraged.    Tachycardia-bradycardia syndrome, s/p PPM Denies any issues. This is being managed by EP-continue to follow-up with EP as scheduled.   Dispo: Follow-up with me/APP in 6 weeks or sooner if anything changes.  Signed, Lasalle Pointer, NP

## 2023-07-08 ENCOUNTER — Encounter (HOSPITAL_COMMUNITY): Payer: Self-pay | Admitting: Cardiology

## 2023-07-08 LAB — ECHOCARDIOGRAM COMPLETE
AR max vel: 2.01 cm2
AV Area VTI: 2.32 cm2
AV Area mean vel: 2.28 cm2
AV Mean grad: 4 mmHg
AV Peak grad: 9.7 mmHg
Ao pk vel: 1.56 m/s
Calc EF: 67.7 %
Height: 63 in
MV VTI: 1.74 cm2
S' Lateral: 2.5 cm
Single Plane A2C EF: 65.5 %
Single Plane A4C EF: 69.1 %
Weight: 2432 [oz_av]

## 2023-07-13 ENCOUNTER — Telehealth (HOSPITAL_COMMUNITY): Payer: Self-pay

## 2023-07-13 ENCOUNTER — Ambulatory Visit: Payer: Self-pay | Admitting: Nurse Practitioner

## 2023-07-13 NOTE — Telephone Encounter (Signed)
 Attempted to reach patient to discuss upcoming procedure, no answer. Left VM for patient to return call.

## 2023-07-15 ENCOUNTER — Encounter: Payer: Self-pay | Admitting: Emergency Medicine

## 2023-07-15 ENCOUNTER — Encounter: Payer: Self-pay | Admitting: Nurse Practitioner

## 2023-07-15 NOTE — Pre-Procedure Instructions (Signed)
 Attempted to call patient regarding procedure instructions.  Left voicemail on the following items: Arrival time 0800 Nothing to eat or drink after midnight No meds AM of procedure Responsible person to drive you home and stay with you for 24 hrs  Have you missed any doses of anti-coagulant Xarelto- should be taken once a day, if you have missed any doses please let us know.

## 2023-07-16 ENCOUNTER — Ambulatory Visit (HOSPITAL_COMMUNITY)
Admission: RE | Admit: 2023-07-16 | Discharge: 2023-07-16 | Disposition: A | Discharge status: Home / Self Care | Attending: Cardiovascular Disease | Admitting: Cardiovascular Disease

## 2023-07-16 ENCOUNTER — Ambulatory Visit (HOSPITAL_COMMUNITY): Admitting: Anesthesiology

## 2023-07-16 ENCOUNTER — Other Ambulatory Visit: Payer: Self-pay

## 2023-07-16 ENCOUNTER — Encounter (HOSPITAL_COMMUNITY)
Admission: RE | Disposition: A | Discharge status: Home / Self Care | Payer: Self-pay | Source: Home / Self Care | Attending: Cardiovascular Disease

## 2023-07-16 DIAGNOSIS — Z955 Presence of coronary angioplasty implant and graft: Secondary | ICD-10-CM | POA: Insufficient documentation

## 2023-07-16 DIAGNOSIS — Z7901 Long term (current) use of anticoagulants: Secondary | ICD-10-CM | POA: Diagnosis not present

## 2023-07-16 DIAGNOSIS — I484 Atypical atrial flutter: Secondary | ICD-10-CM

## 2023-07-16 DIAGNOSIS — I10 Essential (primary) hypertension: Secondary | ICD-10-CM | POA: Diagnosis not present

## 2023-07-16 DIAGNOSIS — I4892 Unspecified atrial flutter: Secondary | ICD-10-CM | POA: Diagnosis not present

## 2023-07-16 DIAGNOSIS — I4891 Unspecified atrial fibrillation: Secondary | ICD-10-CM | POA: Diagnosis not present

## 2023-07-16 DIAGNOSIS — E119 Type 2 diabetes mellitus without complications: Secondary | ICD-10-CM | POA: Diagnosis not present

## 2023-07-16 DIAGNOSIS — I2510 Atherosclerotic heart disease of native coronary artery without angina pectoris: Secondary | ICD-10-CM | POA: Insufficient documentation

## 2023-07-16 DIAGNOSIS — I495 Sick sinus syndrome: Secondary | ICD-10-CM | POA: Insufficient documentation

## 2023-07-16 DIAGNOSIS — Z79899 Other long term (current) drug therapy: Secondary | ICD-10-CM | POA: Diagnosis not present

## 2023-07-16 DIAGNOSIS — I4819 Other persistent atrial fibrillation: Secondary | ICD-10-CM | POA: Insufficient documentation

## 2023-07-16 DIAGNOSIS — I483 Typical atrial flutter: Secondary | ICD-10-CM | POA: Diagnosis not present

## 2023-07-16 DIAGNOSIS — R600 Localized edema: Secondary | ICD-10-CM | POA: Diagnosis not present

## 2023-07-16 DIAGNOSIS — Z950 Presence of cardiac pacemaker: Secondary | ICD-10-CM | POA: Diagnosis not present

## 2023-07-16 HISTORY — PX: ATRIAL FIBRILLATION ABLATION: EP1191

## 2023-07-16 LAB — POCT ACTIVATED CLOTTING TIME: Activated Clotting Time: 441 s

## 2023-07-16 LAB — GLUCOSE, CAPILLARY
Glucose-Capillary: 164 mg/dL — ABNORMAL HIGH (ref 70–99)
Glucose-Capillary: 199 mg/dL — ABNORMAL HIGH (ref 70–99)

## 2023-07-16 MED ORDER — PROTAMINE SULFATE 10 MG/ML IV SOLN
INTRAVENOUS | Status: DC | PRN
  Administered 2023-07-16: 50 mg via INTRAVENOUS

## 2023-07-16 MED ORDER — ONDANSETRON HCL 4 MG/2ML IJ SOLN: 4.0000 mg | Freq: Four times a day (QID) | INTRAMUSCULAR | Status: DC | PRN

## 2023-07-16 MED ORDER — FENTANYL CITRATE (PF) 100 MCG/2ML IJ SOLN
25.0000 ug | Freq: Once | INTRAMUSCULAR | Status: AC
  Administered 2023-07-16: 25 ug via INTRAVENOUS

## 2023-07-16 MED ORDER — ROCURONIUM BROMIDE 100 MG/10ML IV SOLN
INTRAVENOUS | Status: DC | PRN
  Administered 2023-07-16: 10 mg via INTRAVENOUS
  Administered 2023-07-16: 50 mg via INTRAVENOUS

## 2023-07-16 MED ORDER — SUGAMMADEX SODIUM 200 MG/2ML IV SOLN
INTRAVENOUS | Status: DC | PRN
  Administered 2023-07-16: 200 mg via INTRAVENOUS

## 2023-07-16 MED ORDER — ACETAMINOPHEN 325 MG PO TABS
ORAL_TABLET | ORAL | Status: AC
  Filled 2023-07-16: qty 2

## 2023-07-16 MED ORDER — SODIUM CHLORIDE 0.9% FLUSH
3.0000 mL | INTRAVENOUS | Status: DC | PRN
Start: 2023-07-16 — End: 2023-07-16

## 2023-07-16 MED ORDER — ACETAMINOPHEN 325 MG PO TABS
650.0000 mg | ORAL_TABLET | ORAL | Status: DC | PRN
  Administered 2023-07-16: 650 mg via ORAL

## 2023-07-16 MED ORDER — HEPARIN SODIUM (PORCINE) 1000 UNIT/ML IJ SOLN
INTRAMUSCULAR | Status: DC | PRN
  Administered 2023-07-16: 11000 [IU] via INTRAVENOUS

## 2023-07-16 MED ORDER — FENTANYL CITRATE (PF) 100 MCG/2ML IJ SOLN
INTRAMUSCULAR | Status: AC
  Filled 2023-07-16: qty 2

## 2023-07-16 MED ORDER — ATROPINE SULFATE 1 MG/ML IV SOLN
INTRAVENOUS | Status: DC | PRN
  Administered 2023-07-16: 1 mg via INTRAVENOUS

## 2023-07-16 MED ORDER — HEPARIN (PORCINE) IN NACL 1000-0.9 UT/500ML-% IV SOLN
INTRAVENOUS | Status: DC | PRN
  Administered 2023-07-16 (×3): 500 mL

## 2023-07-16 MED ORDER — LIDOCAINE HCL (CARDIAC) PF 100 MG/5ML IV SOSY
PREFILLED_SYRINGE | INTRAVENOUS | Status: DC | PRN
  Administered 2023-07-16: 60 mg via INTRATRACHEAL

## 2023-07-16 MED ORDER — SODIUM CHLORIDE 0.9% FLUSH: 3.0000 mL | Freq: Two times a day (BID) | INTRAVENOUS | Status: DC

## 2023-07-16 MED ORDER — PHENYLEPHRINE HCL-NACL 20-0.9 MG/250ML-% IV SOLN
INTRAVENOUS | Status: DC | PRN
  Administered 2023-07-16: 10 ug/min via INTRAVENOUS

## 2023-07-16 MED ORDER — SODIUM CHLORIDE 0.9 % IV SOLN: 250.0000 mL | INTRAVENOUS | Status: DC | PRN

## 2023-07-16 MED ORDER — SODIUM CHLORIDE 0.9 % IV SOLN: INTRAVENOUS | Status: DC

## 2023-07-16 MED ORDER — CEFAZOLIN SODIUM-DEXTROSE 2-4 GM/100ML-% IV SOLN
INTRAVENOUS | Status: AC
  Filled 2023-07-16: qty 100

## 2023-07-16 MED ORDER — FENTANYL CITRATE (PF) 250 MCG/5ML IJ SOLN
INTRAMUSCULAR | Status: DC | PRN
  Administered 2023-07-16 (×2): 50 ug via INTRAVENOUS

## 2023-07-16 MED ORDER — DEXAMETHASONE SODIUM PHOSPHATE 10 MG/ML IJ SOLN
INTRAMUSCULAR | Status: DC | PRN
  Administered 2023-07-16: 5 mg via INTRAVENOUS

## 2023-07-16 MED ORDER — CEFAZOLIN SODIUM-DEXTROSE 2-3 GM-%(50ML) IV SOLR
INTRAVENOUS | Status: DC | PRN
  Administered 2023-07-16: 2 g via INTRAVENOUS

## 2023-07-16 MED ORDER — PROPOFOL 10 MG/ML IV BOLUS
INTRAVENOUS | Status: DC | PRN
  Administered 2023-07-16: 150 mg via INTRAVENOUS
  Administered 2023-07-16: 150 ug/kg/min via INTRAVENOUS

## 2023-07-16 NOTE — Anesthesia Procedure Notes (Signed)
 Procedure Name: Intubation Date/Time: 07/16/2023 10:00 AM  Performed by: Linard Reno, CRNAPre-anesthesia Checklist: Patient identified, Emergency Drugs available, Suction available and Patient being monitored Patient Re-evaluated:Patient Re-evaluated prior to induction Oxygen Delivery Method: Circle System Utilized Preoxygenation: Pre-oxygenation with 100% oxygen Induction Type: IV induction Ventilation: Mask ventilation without difficulty Laryngoscope Size: Mac and 3 Grade View: Grade I Tube type: Oral Tube size: 7.0 mm Number of attempts: 1 Airway Equipment and Method: Stylet and Oral airway Placement Confirmation: ETT inserted through vocal cords under direct vision, positive ETCO2 and breath sounds checked- equal and bilateral Secured at: 21 cm Tube secured with: Tape Dental Injury: Teeth and Oropharynx as per pre-operative assessment

## 2023-07-16 NOTE — Discharge Instructions (Signed)

## 2023-07-16 NOTE — Anesthesia Preprocedure Evaluation (Addendum)
 Anesthesia Evaluation  Patient identified by MRN, date of birth, ID band Patient awake    Reviewed: Allergy & Precautions, NPO status , Patient's Chart, lab work & pertinent test results  History of Anesthesia Complications (+) PONV and history of anesthetic complications  Airway Mallampati: III  TM Distance: >3 FB Neck ROM: Full    Dental  (+) Dental Advisory Given   Pulmonary neg pulmonary ROS   breath sounds clear to auscultation       Cardiovascular hypertension, Pt. on medications + CAD and + Cardiac Stents  + dysrhythmias Atrial Fibrillation + pacemaker  Rhythm:Regular Rate:Normal     Neuro/Psych negative neurological ROS     GI/Hepatic negative GI ROS, Neg liver ROS,,,  Endo/Other  diabetes, Type 2    Renal/GU negative Renal ROS     Musculoskeletal   Abdominal   Peds  Hematology negative hematology ROS (+)   Anesthesia Other Findings   Reproductive/Obstetrics                             Lab Results  Component Value Date   WBC 7.5 06/22/2023   HGB 17.5 (H) 06/22/2023   HCT 53.0 (H) 06/22/2023   MCV 93 06/22/2023   PLT 224 06/22/2023   Lab Results  Component Value Date   NA 143 06/22/2023   CL 103 06/22/2023   K 4.4 06/22/2023   CO2 22 06/22/2023   BUN 17 06/22/2023   CREATININE 0.92 06/22/2023   EGFR 63 06/22/2023   CALCIUM 9.6 06/22/2023   ALBUMIN 4.0 10/05/2018   GLUCOSE 186 (H) 06/22/2023    Anesthesia Physical Anesthesia Plan  ASA: 3  Anesthesia Plan: General   Post-op Pain Management: Tylenol  PO (pre-op)*   Induction:   PONV Risk Score and Plan: 4 or greater and Ondansetron , Dexamethasone, Treatment may vary due to age or medical condition and Propofol  infusion  Airway Management Planned: Oral ETT  Additional Equipment:   Intra-op Plan:   Post-operative Plan: Extubation in OR  Informed Consent: I have reviewed the patients History and Physical,  chart, labs and discussed the procedure including the risks, benefits and alternatives for the proposed anesthesia with the patient or authorized representative who has indicated his/her understanding and acceptance.     Dental advisory given  Plan Discussed with: CRNA  Anesthesia Plan Comments:         Anesthesia Quick Evaluation

## 2023-07-16 NOTE — Interval H&P Note (Signed)
 History and Physical Interval Note:  07/16/2023 9:26 AM  [name] [name]  has presented today for surgery, with the diagnosis of afib.  The various methods of treatment have been discussed with the patient and family. After consideration of risks, benefits and other options for treatment, the patient has consented to  Procedure(s): ATRIAL FIBRILLATION ABLATION (N/A) as a surgical intervention.  The patient's history has been reviewed, patient examined, no change in status, stable for surgery.  I have reviewed the patient's chart and labs.  Questions were answered to the patient's satisfaction.    I reviewed the patient's CT and labs. There was no LAA thrombus. she  has not missed any doses of anticoagulation, and she took her dose last night. There have been no changes in the patient's diagnoses, medications, or condition since our recent clinic visit.   Taci Sterling E Zackeriah Kissler

## 2023-07-16 NOTE — Transfer of Care (Signed)
 Immediate Anesthesia Transfer of Care Note  Patient: [name] [name]  Procedure(s) Performed: ATRIAL FIBRILLATION ABLATION  Patient Location: Cath Lab  Anesthesia Type:General  Level of Consciousness: awake, alert , and oriented  Airway & Oxygen Therapy: Patient Spontanous Breathing and Patient connected to face mask oxygen  Post-op Assessment: Report given to RN and Post -op Vital signs reviewed and stable  Post vital signs: Reviewed and stable  Last Vitals:  Vitals Value Taken Time  BP    Temp    Pulse 60 07/16/23 1214  Resp 21 07/16/23 1214  SpO2 91 % 07/16/23 1214  Vitals shown include unfiled device data.  Last Pain:  Vitals:   07/16/23 0836  TempSrc:   PainSc: 0-No pain      Patients Stated Pain Goal: 4 (07/16/23 0836)  Complications: No notable events documented.

## 2023-07-16 NOTE — Progress Notes (Signed)
 Patient complains of chest pain 8/10. Dr. Arlester Ladd made aware. ECG reviewed with MD. Repositioned, Tylenol  given without relief. Order for fentanyl obtained and administered.

## 2023-07-16 NOTE — Anesthesia Postprocedure Evaluation (Signed)
 Anesthesia Post Note  Patient: [name] [name]  Procedure(s) Performed: ATRIAL FIBRILLATION ABLATION     Patient location during evaluation: PACU Anesthesia Type: General Level of consciousness: awake and alert Pain management: pain level controlled Vital Signs Assessment: post-procedure vital signs reviewed and stable Respiratory status: spontaneous breathing, nonlabored ventilation, respiratory function stable and patient connected to nasal cannula oxygen Cardiovascular status: blood pressure returned to baseline and stable Postop Assessment: no apparent nausea or vomiting Anesthetic complications: no  No notable events documented.  Last Vitals:  Vitals:   07/16/23 1515 07/16/23 1530  BP:  138/61  Pulse: 60 60  Resp: 20 19  Temp:    SpO2: 91% 90%    Last Pain:  Vitals:   07/16/23 1355  TempSrc:   PainSc: 3    Pain Goal: Patients Stated Pain Goal: 4 (07/16/23 0836)                 Melvenia Stabs

## 2023-07-17 ENCOUNTER — Encounter (HOSPITAL_COMMUNITY): Payer: Self-pay | Admitting: Cardiovascular Disease

## 2023-07-19 ENCOUNTER — Telehealth (HOSPITAL_COMMUNITY): Payer: Self-pay

## 2023-07-19 MED FILL — Fentanyl Citrate Preservative Free (PF) Inj 100 MCG/2ML: INTRAMUSCULAR | Qty: 1 | Status: AC

## 2023-07-19 NOTE — Telephone Encounter (Signed)
 Attempted to reach patient to follow up with procedure completed on 07/16/23, no answer. Left VM for patient to return call.

## 2023-07-23 ENCOUNTER — Ambulatory Visit (INDEPENDENT_AMBULATORY_CARE_PROVIDER_SITE_OTHER): Payer: Self-pay

## 2023-07-23 ENCOUNTER — Other Ambulatory Visit (HOSPITAL_COMMUNITY): Payer: Self-pay | Admitting: Physician Assistant

## 2023-07-23 DIAGNOSIS — I495 Sick sinus syndrome: Secondary | ICD-10-CM

## 2023-07-23 DIAGNOSIS — E0590 Thyrotoxicosis, unspecified without thyrotoxic crisis or storm: Secondary | ICD-10-CM

## 2023-07-24 LAB — CUP PACEART REMOTE DEVICE CHECK
Battery Remaining Longevity: 146 mo
Battery Voltage: 3.03 V
Brady Statistic AP VP Percent: 1.34 %
Brady Statistic AP VS Percent: 89.14 %
Brady Statistic AS VP Percent: 0.35 %
Brady Statistic AS VS Percent: 9.19 %
Brady Statistic RA Percent Paced: 86.8 %
Brady Statistic RV Percent Paced: 2.86 %
Date Time Interrogation Session: 20250606042723
Implantable Lead Connection Status: 753985
Implantable Lead Connection Status: 753985
Implantable Lead Implant Date: 20110510
Implantable Lead Implant Date: 20110510
Implantable Lead Location: 753859
Implantable Lead Location: 753860
Implantable Lead Model: 5076
Implantable Lead Model: 5092
Implantable Pulse Generator Implant Date: 20230901
Lead Channel Impedance Value: 361 Ohm
Lead Channel Impedance Value: 437 Ohm
Lead Channel Impedance Value: 456 Ohm
Lead Channel Impedance Value: 475 Ohm
Lead Channel Pacing Threshold Amplitude: 0.75 V
Lead Channel Pacing Threshold Amplitude: 1.125 V
Lead Channel Pacing Threshold Pulse Width: 0.4 ms
Lead Channel Pacing Threshold Pulse Width: 0.4 ms
Lead Channel Sensing Intrinsic Amplitude: 1.375 mV
Lead Channel Sensing Intrinsic Amplitude: 1.375 mV
Lead Channel Sensing Intrinsic Amplitude: 4.75 mV
Lead Channel Sensing Intrinsic Amplitude: 4.75 mV
Lead Channel Setting Pacing Amplitude: 1.5 V
Lead Channel Setting Pacing Amplitude: 2.5 V
Lead Channel Setting Pacing Pulse Width: 0.4 ms
Lead Channel Setting Sensing Sensitivity: 0.9 mV
Zone Setting Status: 755011
Zone Setting Status: 755011

## 2023-07-27 ENCOUNTER — Ambulatory Visit: Payer: Self-pay | Admitting: Cardiovascular Disease

## 2023-08-10 ENCOUNTER — Encounter (HOSPITAL_COMMUNITY): Payer: Self-pay

## 2023-08-10 ENCOUNTER — Encounter (HOSPITAL_COMMUNITY)
Admission: RE | Admit: 2023-08-10 | Discharge: 2023-08-10 | Disposition: A | Discharge status: Home / Self Care | Source: Ambulatory Visit | Attending: Physician Assistant | Admitting: Physician Assistant

## 2023-08-10 DIAGNOSIS — E0590 Thyrotoxicosis, unspecified without thyrotoxic crisis or storm: Secondary | ICD-10-CM | POA: Diagnosis not present

## 2023-08-10 MED ORDER — SODIUM IODIDE I-123 7.4 MBQ CAPS
300.0000 | ORAL_CAPSULE | Freq: Once | ORAL | Status: AC
  Administered 2023-08-10: 301 via ORAL

## 2023-08-11 ENCOUNTER — Encounter (HOSPITAL_COMMUNITY)
Admission: RE | Admit: 2023-08-11 | Discharge: 2023-08-11 | Disposition: A | Discharge status: Home / Self Care | Source: Ambulatory Visit | Attending: Physician Assistant | Admitting: Physician Assistant

## 2023-08-11 ENCOUNTER — Encounter (HOSPITAL_COMMUNITY): Payer: Self-pay

## 2023-08-11 DIAGNOSIS — E049 Nontoxic goiter, unspecified: Secondary | ICD-10-CM | POA: Diagnosis not present

## 2023-08-13 ENCOUNTER — Ambulatory Visit (HOSPITAL_COMMUNITY): Admitting: Internal Medicine

## 2023-08-16 ENCOUNTER — Ambulatory Visit (HOSPITAL_COMMUNITY)
Admission: RE | Admit: 2023-08-16 | Discharge: 2023-08-16 | Disposition: A | Discharge status: Home / Self Care | Source: Ambulatory Visit | Attending: Internal Medicine | Admitting: Internal Medicine

## 2023-08-16 ENCOUNTER — Ambulatory Visit (HOSPITAL_COMMUNITY): Admitting: Internal Medicine

## 2023-08-16 VITALS — BP 138/64 | HR 76 | Ht 63.0 in | Wt 144.4 lb

## 2023-08-16 DIAGNOSIS — I480 Paroxysmal atrial fibrillation: Secondary | ICD-10-CM | POA: Diagnosis not present

## 2023-08-16 DIAGNOSIS — D6869 Other thrombophilia: Secondary | ICD-10-CM | POA: Diagnosis not present

## 2023-08-16 DIAGNOSIS — I4891 Unspecified atrial fibrillation: Secondary | ICD-10-CM | POA: Diagnosis not present

## 2023-08-16 NOTE — Progress Notes (Signed)
 Primary Care Physician: Lari Elspeth BRAVO, MD Primary Cardiologist: Jayson Sierras, MD Electrophysiologist: Eulas BRAVO Furbish, MD     Referring Physician: Dr. Furbish Apolinar JAYSON [name] [name] is a 78 y.o. female with a history of CAD, tachy-brady syndrome s/p PPM, HTN, HLD, and atrial fibrillation who presents for consultation in the [facility]. Patient is on Xarelto  20 mg daily for a CHADS2VASC score of 5.  On evaluation today, patient is currently in A paced rhythm. S/p Afib ablation on 07/16/23 by Dr. Furbish. Remote PPM interrogation by Dr. Furbish on 07/24/23 showed few episodes of atrial arrhythmia with burden decreased from 65% to 3.4%. She notes to still feel tired. No chest pain or SOB. Leg sites healed without issue. No missed doses of anticoagulant.  Today, she denies symptoms of orthopnea, PND, lower extremity edema, dizziness, presyncope, syncope, snoring, daytime somnolence, bleeding, or neurologic sequela. The patient is tolerating medications without difficulties and is otherwise without complaint today.    she has a BMI of Body mass index is 25.58 kg/m.SABRA Filed Weights   08/16/23 1343  Weight: 65.5 kg    Current Outpatient Medications  Medication Sig Dispense Refill   Cholecalciferol (D-3-5) 125 MCG (5000 UT) capsule Take 5,000 Units by mouth at bedtime.     cyanocobalamin  (VITAMIN B12) 1000 MCG tablet Take 1,000 mcg by mouth in the morning.     diltiazem  (DILT-XR) 120 MG 24 hr capsule TAKE 2 CAPSULES BY MOUTH DAILY  IN THE MORNING 120 capsule 5   furosemide  (LASIX ) 20 MG tablet Take 3 tablets (60 mg total) by mouth daily. 270 tablet 3   JARDIANCE 25 MG TABS tablet Take 25 mg by mouth daily.     losartan  (COZAAR ) 50 MG tablet Take 50 mg by mouth daily.     Multiple Vitamin (MULTIVITAMIN) tablet Take 1 tablet by mouth daily. NATURE BOUNTY- FOR HAIR GROWTH     nitroGLYCERIN  (NITROSTAT ) 0.4 MG SL tablet Place 1 tablet (0.4 mg total) under the tongue  every 5 (five) minutes x 3 doses as needed for chest pain (if no relief after 3 rd dose, proceed to ED for an evaluation). 75 tablet 3   potassium chloride  SA (KLOR-CON  M) 20 MEQ tablet Take 1 tablet (20 mEq total) by mouth daily. 90 tablet 2   pravastatin  (PRAVACHOL ) 40 MG tablet TAKE 1 TABLET BY MOUTH IN THE  EVENING 100 tablet 2   tirzepatide (MOUNJARO) 7.5 MG/0.5ML Pen Inject 7.5 mg into the skin once a week.     XARELTO  20 MG TABS tablet TAKE 1 TABLET BY MOUTH IN THE  EVENING 100 tablet 2   No current facility-administered medications for this encounter.    Atrial Fibrillation Management history:  Previous antiarrhythmic drugs: tikosyn  (failed due to Qtc prolongation), failed amiodarone  Previous cardioversions: 05/07/23 Previous ablations: 07/16/23 Anticoagulation history: Xarelto  20 mg daily   ROS- All systems are reviewed and negative except as per the HPI above.  Physical Exam: BP 138/64   Pulse 76   Ht 5' 3 (1.6 m)   Wt 65.5 kg   BMI 25.58 kg/m   GEN: Well nourished, well developed in no acute distress NECK: No JVD; No carotid bruits CARDIAC: Regular rate and rhythm, no murmurs, rubs, gallops RESPIRATORY:  Clear to auscultation without rales, wheezing or rhonchi  ABDOMEN: Soft, non-tender, non-distended EXTREMITIES:  No edema; No deformity   EKG today demonstrates  Vent. rate 76 BPM PR interval 256 ms  QRS duration 100 ms QT/QTcB 404/454 ms P-R-T axes 77 66 -46 Atrial-paced rhythm with prolonged AV conduction with Premature atrial complexes T wave abnormality, consider inferior ischemia Abnormal ECG When compared with ECG of 16-Jul-2023 12:22, Premature atrial complexes are now Present   Echo 07/07/23 demonstrated  1. Left ventricular ejection fraction, by estimation, is 60 to 65%. Left  ventricular ejection fraction by 3D volume is 32 %. Left ventricular  ejection fraction by 2D MOD biplane is 67.7 %. Left ventricular ejection  fraction by PLAX is 62 %. The  left  ventricle has normal function. The left ventricle has no regional wall  motion abnormalities. There is moderate concentric left ventricular  hypertrophy. Left ventricular diastolic function could not be evaluated.   2. Right ventricular systolic function is normal. The right ventricular  size is normal. There is mildly elevated pulmonary artery systolic  pressure.   3. The mitral valve is normal in structure. Trivial mitral valve  regurgitation. No evidence of mitral stenosis.   4. The aortic valve has an indeterminant number of cusps. Aortic valve  regurgitation is trivial. No aortic stenosis is present.   5. The inferior vena cava is normal in size with greater than 50%  respiratory variability, suggesting right atrial pressure of 3 mmHg.   ASSESSMENT & PLAN CHA2DS2-VASc Score = 5  The patient's score is based upon: CHF History: 0 HTN History: 1 Diabetes History: 0 Stroke History: 0 Vascular Disease History: 1 Age Score: 2 Gender Score: 1       ASSESSMENT AND PLAN: Paroxysmal Atrial Fibrillation (ICD10:  I48.0) The patient's CHA2DS2-VASc score is 5, indicating a 7.2% annual risk of stroke.   S/p Afib ablation on 07/16/23 by Dr. Nancey.  She is currently in A paced rhythm. Continue diltiazem  240 mg daily.   Secondary Hypercoagulable State (ICD10:  D68.69) The patient is at significant risk for stroke/thromboembolism based upon her CHA2DS2-VASc Score of 5.  Continue Rivaroxaban  (Xarelto ).  No missed doses.      Follow up with EP as scheduled.   Terra Pac, PA-C  Afib Clinic [facility] [address] [place], [place] [address] [phone]

## 2023-08-23 DIAGNOSIS — Z1329 Encounter for screening for other suspected endocrine disorder: Secondary | ICD-10-CM | POA: Diagnosis not present

## 2023-08-23 DIAGNOSIS — E1165 Type 2 diabetes mellitus with hyperglycemia: Secondary | ICD-10-CM | POA: Diagnosis not present

## 2023-08-23 LAB — TSH: TSH: 0.14 — AB (ref 0.41–5.90)

## 2023-08-25 DIAGNOSIS — I10 Essential (primary) hypertension: Secondary | ICD-10-CM | POA: Diagnosis not present

## 2023-08-25 DIAGNOSIS — E1165 Type 2 diabetes mellitus with hyperglycemia: Secondary | ICD-10-CM | POA: Diagnosis not present

## 2023-08-25 DIAGNOSIS — I482 Chronic atrial fibrillation, unspecified: Secondary | ICD-10-CM | POA: Diagnosis not present

## 2023-08-25 DIAGNOSIS — Z950 Presence of cardiac pacemaker: Secondary | ICD-10-CM | POA: Diagnosis not present

## 2023-09-06 ENCOUNTER — Ambulatory Visit: Attending: Cardiology | Admitting: Cardiology

## 2023-09-06 ENCOUNTER — Encounter: Payer: Self-pay | Admitting: Cardiology

## 2023-09-06 VITALS — BP 104/62 | HR 77 | Ht 63.0 in | Wt 145.0 lb

## 2023-09-06 DIAGNOSIS — I480 Paroxysmal atrial fibrillation: Secondary | ICD-10-CM | POA: Diagnosis not present

## 2023-09-06 DIAGNOSIS — I10 Essential (primary) hypertension: Secondary | ICD-10-CM

## 2023-09-06 DIAGNOSIS — I25119 Atherosclerotic heart disease of native coronary artery with unspecified angina pectoris: Secondary | ICD-10-CM

## 2023-09-06 DIAGNOSIS — R600 Localized edema: Secondary | ICD-10-CM | POA: Diagnosis not present

## 2023-09-06 NOTE — Patient Instructions (Addendum)

## 2023-09-06 NOTE — Progress Notes (Signed)
 Cardiology Office Note  Date: 09/06/2023   ID: SKYLEE BAIRD, DOB 1945/03/26, MRN [id number]  History of Present Illness: [name] [name] is an 78 y.o. female last seen by Ms. Miriam NP in May.  She has had follow-up in the atrial fibrillation clinic in June as well.  She is here for a routine visit.  She does not report any progressive dyspnea on exertion, generally NYHA class II.  No sense of palpitations or chest pain.  Has mild left ankle edema that gets better when she elevates her legs.  She is also on Lasix .  Medtronic pacemaker in place with follow-up by Dr. Nancey.  She had an atrial fibrillation ablation in May.  Most recent device interrogation showed substantial improvement in AF burden from 65% to 3.4%.  We went over her medications.  She does not report any spontaneous bleeding problems on Xarelto .  I also went over her last echocardiogram from May.  Physical Exam: VS:  BP 104/62   Pulse 77   Ht 5' 3 (1.6 m)   Wt 145 lb (65.8 kg)   SpO2 97%   BMI 25.69 kg/m , BMI Body mass index is 25.69 kg/m.  Wt Readings from Last 3 Encounters:  09/06/23 145 lb (65.8 kg)  08/16/23 144 lb 6.4 oz (65.5 kg)  07/16/23 150 lb (68 kg)    General: Patient appears comfortable at rest. HEENT: Conjunctiva and lids normal. Neck: Supple, no elevated JVP or carotid bruits. Lungs: Clear to auscultation, nonlabored breathing at rest. Cardiac: Regular rate and rhythm, no S3 or significant systolic murmur. Extremities: Mild left ankle edema.  ECG:  An ECG dated 08/16/2023 was personally reviewed today and demonstrated:  Atrial paced rhythm with PACs and nonspecific ST-T changes.  Labwork: 06/22/2023: BUN 17; Creatinine, Ser 0.92; Hemoglobin 17.5; Platelets 224; Potassium 4.4; Sodium 143  May 2025: Cholesterol 119, triglycerides 92, HDL 28, LDL 73, TSH 0.038, hemoglobin A1c 7.4%  Other Studies Reviewed Today:  Echocardiogram 07/07/2023:  1. Left ventricular ejection fraction, by estimation, is  60 to 65%. Left  ventricular ejection fraction by 3D volume is 32 %. Left ventricular  ejection fraction by 2D MOD biplane is 67.7 %. Left ventricular ejection  fraction by PLAX is 62 %. The left  ventricle has normal function. The left ventricle has no regional wall  motion abnormalities. There is moderate concentric left ventricular  hypertrophy. Left ventricular diastolic function could not be evaluated.   2. Right ventricular systolic function is normal. The right ventricular  size is normal. There is mildly elevated pulmonary artery systolic  pressure.   3. The mitral valve is normal in structure. Trivial mitral valve  regurgitation. No evidence of mitral stenosis.   4. The aortic valve has an indeterminant number of cusps. Aortic valve  regurgitation is trivial. No aortic stenosis is present.   5. The inferior vena cava is normal in size with greater than 50%  respiratory variability, suggesting right atrial pressure of 3 mmHg.   Assessment and Plan:  1. Paroxysmal atrial fibrillation with CHA2DS2-VASc score of 6.  She underwent atrial fibrillation ablation with Dr. Nancey in May.  Device interrogation shows significant decrease in AF.  Continue Xarelto  20 mg daily and diltiazem  CD 240 mg daily.   2. 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 Nancey.   3. CAD status post BMS to the circumflex in 2008.  No active angina.  She has as needed nitroglycerin  available and is also  on both Jardiance 25 mg daily and Mounjaro 7.5 mg weekly.  Also on Pravachol  40 mg daily.   4.  Primary hypertension.  Blood pressure well-controlled today.  Continue Cozaar  50 mg daily  5.  Mild left ankle edema.  Continue Lasix  60 mg daily, also elevate the legs.  Could use compression stockings below the knees.  Disposition:  Follow up 6 months.  Signed, Jayson JUDITHANN Sierras, M.D., F.A.C.C. [facility]

## 2023-09-07 NOTE — Addendum Note (Signed)
 Addended by: VICCI SELLER A on: 09/07/2023 10:06 AM   Modules accepted: Orders

## 2023-09-07 NOTE — Progress Notes (Signed)
 Remote pacemaker transmission.

## 2023-09-10 ENCOUNTER — Other Ambulatory Visit: Payer: Self-pay | Admitting: Cardiology

## 2023-09-17 ENCOUNTER — Other Ambulatory Visit: Payer: Self-pay | Admitting: Cardiology

## 2023-10-22 ENCOUNTER — Ambulatory Visit (INDEPENDENT_AMBULATORY_CARE_PROVIDER_SITE_OTHER): Payer: Self-pay

## 2023-10-22 DIAGNOSIS — I495 Sick sinus syndrome: Secondary | ICD-10-CM | POA: Diagnosis not present

## 2023-10-23 LAB — CUP PACEART REMOTE DEVICE CHECK
Battery Remaining Longevity: 136 mo
Battery Voltage: 3.04 V
Brady Statistic AP VP Percent: 1.19 %
Brady Statistic AP VS Percent: 80.36 %
Brady Statistic AS VP Percent: 0.25 %
Brady Statistic AS VS Percent: 18.21 %
Brady Statistic RA Percent Paced: 78.46 %
Brady Statistic RV Percent Paced: 2.49 %
Date Time Interrogation Session: 20250904233338
Implantable Lead Connection Status: 753985
Implantable Lead Connection Status: 753985
Implantable Lead Implant Date: 20110510
Implantable Lead Implant Date: 20110510
Implantable Lead Location: 753859
Implantable Lead Location: 753860
Implantable Lead Model: 5076
Implantable Lead Model: 5092
Implantable Pulse Generator Implant Date: 20230901
Lead Channel Impedance Value: 342 Ohm
Lead Channel Impedance Value: 361 Ohm
Lead Channel Impedance Value: 399 Ohm
Lead Channel Impedance Value: 437 Ohm
Lead Channel Pacing Threshold Amplitude: 0.5 V
Lead Channel Pacing Threshold Amplitude: 1.5 V
Lead Channel Pacing Threshold Pulse Width: 0.4 ms
Lead Channel Pacing Threshold Pulse Width: 0.4 ms
Lead Channel Sensing Intrinsic Amplitude: 1.125 mV
Lead Channel Sensing Intrinsic Amplitude: 1.125 mV
Lead Channel Sensing Intrinsic Amplitude: 3.875 mV
Lead Channel Sensing Intrinsic Amplitude: 3.875 mV
Lead Channel Setting Pacing Amplitude: 1.5 V
Lead Channel Setting Pacing Amplitude: 3.25 V
Lead Channel Setting Pacing Pulse Width: 0.4 ms
Lead Channel Setting Sensing Sensitivity: 0.9 mV
Zone Setting Status: 755011
Zone Setting Status: 755011

## 2023-11-03 ENCOUNTER — Ambulatory Visit: Payer: Self-pay | Admitting: Cardiovascular Disease

## 2023-11-03 DIAGNOSIS — Z950 Presence of cardiac pacemaker: Secondary | ICD-10-CM | POA: Diagnosis not present

## 2023-11-03 DIAGNOSIS — E1165 Type 2 diabetes mellitus with hyperglycemia: Secondary | ICD-10-CM | POA: Diagnosis not present

## 2023-11-03 DIAGNOSIS — I10 Essential (primary) hypertension: Secondary | ICD-10-CM | POA: Diagnosis not present

## 2023-11-03 DIAGNOSIS — I482 Chronic atrial fibrillation, unspecified: Secondary | ICD-10-CM | POA: Diagnosis not present

## 2023-11-03 DIAGNOSIS — Z6824 Body mass index (BMI) 24.0-24.9, adult: Secondary | ICD-10-CM | POA: Diagnosis not present

## 2023-11-03 DIAGNOSIS — Z23 Encounter for immunization: Secondary | ICD-10-CM | POA: Diagnosis not present

## 2023-11-04 NOTE — Progress Notes (Signed)
 Remote PPM Transmission

## 2023-11-16 NOTE — Patient Instructions (Incomplete)
 Thyroid Nodule  A thyroid nodule is an isolated growth of thyroid cells that forms a lump in the thyroid gland. The thyroid gland is a butterfly-shaped gland found in the lower front of the neck. It sends chemical messengers (hormones) through the blood to all parts of the body. These hormones are important in regulating body temperature and helping the body use energy. Thyroid nodules are common. Most are not cancerous (are benign). You may have one nodule or several nodules. There are different types of thyroid nodules. They include nodules that: Grow and fill with fluid (thyroid cysts). Produce too much thyroid hormone (hot nodules or hyperthyroid). Produce no thyroid hormone (cold nodules or hypothyroid). Form from cancer cells (thyroid cancers). What are the causes? In most cases, the cause of thyroid nodules is not known. What increases the risk? The following factors may make you more likely to develop thyroid nodules: Age. Thyroid nodules are more common in people who are older than 45 years. Female gender. A family history that includes: Thyroid nodules. Pheochromocytoma. Thyroid carcinoma. Hyperparathyroidism. Certain thyroid diseases, such as Hashimoto's thyroiditis. Lack of iodine in your diet. A history of head and neck radiation, such as from cancer treatments. Type 2 diabetes. What are the signs or symptoms? In many cases, there are no symptoms. If you have symptoms, they may include: A lump in your lower neck. Feeling pressure, fullness, or a tickle in your throat. Pain in your neck, jaw, or ear. Having trouble swallowing or breathing. Hot nodules may cause: Weight loss. Warm, flushed skin. Feeling hot. Feeling nervous. A rapid or irregular heartbeat. Cold nodules may cause: Weight gain. Dry skin. Hair loss, brittle hair, or both. Feeling cold. Fatigue. Thyroid cancer nodules may cause: Hard nodules that can be felt along the thyroid  gland. Hoarseness. Lumps in the tissue (lymph nodes) near your thyroid gland. How is this diagnosed? A thyroid nodule may be felt by your health care provider during a physical exam. This condition may also be diagnosed based on your symptoms. You may also have tests, including: Blood tests to check how well your thyroid is working. An ultrasound. This may be done to confirm the diagnosis. A biopsy. This involves taking a sample from the nodule and looking at it under a microscope. A thyroid scan. This test creates an image of the thyroid gland using a radioactive tracer. Imaging tests such as an MRI or CT scan. These may be done if: A nodule is large. A nodule is blocking your airway. Cancer is suspected. How is this treated? Treatment depends on the cause and size of your nodule or nodules. If a nodule is benign, treatment may not be necessary. Your health care provider may monitor the nodule to see if it goes away without treatment. If a nodule continues to grow, is cancerous, or does not go away, treatment may be needed. Treatment may include: Having a cystic nodule drained with a needle. Ablation therapy. In this treatment, alcohol is injected into the area of the nodule to destroy the cells. Ablation with heat may also be used. This is called thermal ablation. Radioactive iodine. In this treatment, radioactive iodine is given as a pill or liquid that you drink. This substance causes the thyroid nodule to shrink. Surgery to remove the nodule or nodules. Part or all of your thyroid gland may also need to be removed. Medicines to treat hyperthyroidism. Follow these instructions at home: Pay attention to any changes in your thyroid nodule or nodules. Take over-the-counter  and prescription medicines only as told by your health care provider. Keep all follow-up visits. This is important. Contact a health care provider if: You have trouble sleeping. You have muscle weakness. You have  significant weight loss without changing your eating habits. You feel nervous. You have trouble swallowing. You have increased swelling. You have a rapid or irregular heartbeat. Get help right away if: You have chest pain. You faint or lose consciousness. Your nodule makes it hard for you to breathe. These symptoms may be an emergency. Get help right away. Call 911. Do not wait to see if the symptoms will go away. Do not drive yourself to the hospital. Summary A thyroid nodule is an isolated growth of thyroid cells that forms a lump in your thyroid gland. Thyroid nodules are common. Most are not cancerous. Your health care provider may monitor the nodule to see if it goes away without treatment. If a nodule continues to grow, is cancerous, or does not go away, treatment may be needed. Treatment depends on the cause and size of your nodule or nodules. This information is not intended to replace advice given to you by your health care provider. Make sure you discuss any questions you have with your health care provider. Document Revised: 12/16/2020 Document Reviewed: 12/16/2020 Elsevier Patient Education  2024 [employer].

## 2023-11-18 ENCOUNTER — Encounter: Payer: Self-pay | Admitting: Nurse Practitioner

## 2023-11-18 ENCOUNTER — Ambulatory Visit (INDEPENDENT_AMBULATORY_CARE_PROVIDER_SITE_OTHER): Admitting: Nurse Practitioner

## 2023-11-18 VITALS — BP 122/60 | HR 80 | Ht 63.0 in | Wt 142.0 lb

## 2023-11-18 DIAGNOSIS — Z7984 Long term (current) use of oral hypoglycemic drugs: Secondary | ICD-10-CM

## 2023-11-18 DIAGNOSIS — E119 Type 2 diabetes mellitus without complications: Secondary | ICD-10-CM

## 2023-11-18 DIAGNOSIS — R7989 Other specified abnormal findings of blood chemistry: Secondary | ICD-10-CM

## 2023-11-18 DIAGNOSIS — Z7985 Long-term (current) use of injectable non-insulin antidiabetic drugs: Secondary | ICD-10-CM | POA: Diagnosis not present

## 2023-11-18 DIAGNOSIS — E782 Mixed hyperlipidemia: Secondary | ICD-10-CM | POA: Diagnosis not present

## 2023-11-18 DIAGNOSIS — E041 Nontoxic single thyroid nodule: Secondary | ICD-10-CM

## 2023-11-18 DIAGNOSIS — I10 Essential (primary) hypertension: Secondary | ICD-10-CM

## 2023-11-18 NOTE — Progress Notes (Signed)
 Endocrinology Consult Note       11/18/2023, 1:47 PM   Subjective:    Patient ID: [name] [name], female    DOB: March 18, 1945.  [name] [name] is being seen in consultation for  abnormal thyroid  labs and imaging requested by  Lari Elspeth BRAVO, MD.   Past Medical History:  Diagnosis Date   Atrial fibrillation [facility])    Atrial flutter (HCC)    Coronary atherosclerosis of native coronary artery    BMS to circumflex 9/08   HTN (hypertension)    Hyperlipidemia    Multiple thyroid  nodules    PONV (postoperative nausea and vomiting)    Tachy-brady syndrome (HCC)    PPM - [employer]tr[employer]   Type 2 diabetes mellitus (HCC)    Urticaria     Past Surgical History:  Procedure Laterality Date   ATRIAL FIBRILLATION ABLATION N/A 07/16/2023   Procedure: ATRIAL FIBRILLATION ABLATION;  Surgeon: [name] Eulas BRAVO, MD;  Location: [facility] INVASIVE CV LAB;  Service: Cardiovascular;  Laterality: N/A;   BREAST LUMPECTOMY     CARDIOVERSION N/A 05/07/2023   Procedure: CARDIOVERSION;  Surgeon: [name] Jayson MATSU, MD;  Location: [facility] ORS;  Service: Cardiovascular;  Laterality: N/A;  pt notified to arrive at 0830 for 1030 procedure. Voiced understanding.   COLONOSCOPY  2014    Last colonoscopy 2014 by Dr. Donnel. Internal hemorrhoids, sigmoid diverticulosis, multiple sessile polyps, tubular adenomas.    PACEMAKER PLACEMENT     [employer]tr[employer]   PPM GENERATOR CHANGEOUT N/A 10/17/2021   Procedure: PPM GENERATOR CHANGEOUT;  Surgeon: [name] [name] ORN, MD;  Location: [facility] INVASIVE CV LAB;  Service: Cardiovascular;  Laterality: N/A;   ROTATOR CUFF REPAIR     VENTRAL HERNIA REPAIR     VESICOVAGINAL FISTULA CLOSURE W/ TAH      Social History   Socioeconomic History   Marital status: Married    Spouse name: Not on file   Number of children: Not on file   Years of education: Not on file   Highest education level: Not on file  Occupational History   Occupation:  retired    Comment: [employer]  Tobacco Use   Smoking status: Never    Passive exposure: Never   Smokeless tobacco: Never  Vaping Use   Vaping status: Never Used  Substance and Sexual Activity   Alcohol use: Never    Alcohol/week: 0.0 standard drinks of alcohol   Drug use: Never   Sexual activity: Not on file  Other Topics Concern   Not on file  Social History Narrative   Not on file   Social Drivers of Health   Financial Resource Strain: Low Risk  (04/28/2018)   Received from [facility]   Overall Financial Resource Strain (CARDIA)    Difficulty of Paying Living Expenses: Not hard at all  Food Insecurity: No Food Insecurity (04/28/2018)   Received from [facility]   Hunger Vital Sign    Worried About Running Out of Food in the Last Year: Never true    Ran Out of Food in the Last Year: Never true  Transportation Needs: No Transportation Needs (04/28/2018)   Received from [facility]   PRAPARE -  Administrator, Civil Service (Medical): No    Lack of Transportation (Non-Medical): No  Physical Activity: Inactive (04/28/2018)   [facility]   Exercise Vital Sign    Days of Exercise per Week: 0 days    Minutes of Exercise per Session: 0 min  Stress: No Stress Concern Present (04/28/2018)   Received from [facility] Occupational [facility] - Occupational Stress Questionnaire    Feeling of Stress : Not at all  Social Connections: Moderately Integrated (04/28/2018)   Received from [facility]   Social Connection and Isolation Panel    Frequency of Communication with Friends and Family: More than three times a week    Frequency of Social Gatherings with Friends and Family: Twice a week    Attends Religious Services: More than 4 times per year    Active Member of [employer] or Organizations: No    Attends Engineer, structural: Never    Marital Status: Married    Family History  Problem Relation Age of Onset    Heart disease Father    Heart disease Other        one sibling   Colon cancer Neg Hx    Colon polyps Neg Hx     Outpatient Encounter Medications as of 11/18/2023  Medication Sig   Cholecalciferol (D-3-5) 125 MCG (5000 UT) capsule Take 5,000 Units by mouth at bedtime.   cyanocobalamin  (VITAMIN B12) 1000 MCG tablet Take 1,000 mcg by mouth in the morning.   diltiazem  (DILT-XR) 120 MG 24 hr capsule TAKE 2 CAPSULES BY MOUTH DAILY  IN THE MORNING   furosemide  (LASIX ) 20 MG tablet Take 3 tablets (60 mg total) by mouth daily.   JARDIANCE 25 MG TABS tablet Take 25 mg by mouth daily.   losartan  (COZAAR ) 50 MG tablet Take 50 mg by mouth daily.   Multiple Vitamin (MULTIVITAMIN) tablet Take 1 tablet by mouth daily. NATURE BOUNTY- FOR HAIR GROWTH   nitroGLYCERIN  (NITROSTAT ) 0.4 MG SL tablet Place 1 tablet (0.4 mg total) under the tongue every 5 (five) minutes x 3 doses as needed for chest pain (if no relief after 3 rd dose, proceed to ED for an evaluation).   OVER THE COUNTER MEDICATION Take 1 tablet by mouth daily. Patient is taking Nature's Bounty Hair Growth   potassium chloride  SA (KLOR-CON  M) 20 MEQ tablet Take 1 tablet (20 mEq total) by mouth daily.   pravastatin  (PRAVACHOL ) 40 MG tablet TAKE 1 TABLET BY MOUTH IN THE  EVENING   tirzepatide (MOUNJARO) 7.5 MG/0.5ML Pen Inject 7.5 mg into the skin once a week. (Patient taking differently: Inject 10 mg into the skin once a week.)   XARELTO  20 MG TABS tablet TAKE 1 TABLET BY MOUTH IN THE  EVENING   No facility-administered encounter medications on file as of 11/18/2023.    ALLERGIES: Allergies  Allergen Reactions   Benzonatate Cough   Codeine Nausea And Vomiting   Doxycycline Itching   Lisinopril Cough   Metformin And Related Rash    VACCINATION STATUS: Immunization History  Administered Date(s) Administered   Fluad Quad(high Dose 65+) 11/22/2020   INFLUENZA, HIGH DOSE SEASONAL PF 11/26/2016   Influenza,inj,Quad PF,6+ Mos 11/18/2018    Moderna Sars-Covid-2 Vaccination 06/24/2020   Tdap 10/27/2017   Zoster Recombinant(Shingrix) 07/05/2018, 10/18/2018    Thyroid  Problem Presents for initial visit. The condition has lasted for 5 months. Symptoms include fatigue and palpitations (hx afib with  pacemaker). The symptoms have been stable. Past treatments include nothing. Prior procedures include radioiodine uptake scan and thyroid  ultrasound. Her past medical history is significant for atrial fibrillation, diabetes and hyperlipidemia. Risk factors include amiodarone  use (She notes sister had thyroidectomy but unsure of the reason).     Review of systems  Constitutional: + Minimally fluctuating body weight, current Body mass index is 25.15 kg/m., + fatigue, no subjective hyperthermia, no subjective hypothermia Eyes: no blurry vision, no xerophthalmia ENT: no sore throat, + nodules palpated in throat-right side, + dysphagia with food and drink, no hoarseness Cardiovascular: no chest pain, no shortness of breath, no palpitations, no leg swelling Respiratory: no cough, no shortness of breath Gastrointestinal: no nausea/vomiting/diarrhea Musculoskeletal: no muscle/joint aches Skin: no rashes, no hyperemia Neurological: no tremors, no numbness, no tingling, no dizziness Psychiatric: no depression, no anxiety  Objective:     BP 122/60 (BP Location: Left Arm, Patient Position: Sitting)   Pulse 80   Ht 5' 3 (1.6 m)   Wt 142 lb (64.4 kg)   BMI 25.15 kg/m   Wt Readings from Last 3 Encounters:  11/18/23 142 lb (64.4 kg)  09/06/23 145 lb (65.8 kg)  08/16/23 144 lb 6.4 oz (65.5 kg)     BP Readings from Last 3 Encounters:  11/18/23 122/60  09/06/23 104/62  08/16/23 138/64     Physical Exam- Limited  Constitutional:  Body mass index is 25.15 kg/m. , not in acute distress, normal state of mind Eyes:  EOMI, no exophthalmos Neck: Supple Thyroid : R>L Cardiovascular: RRR, + murmur, rubs, or gallops, no edema Respiratory:  Adequate breathing efforts, no crackles, rales, rhonchi, or wheezing Musculoskeletal: no gross deformities, strength intact in all four extremities, no gross restriction of joint movements Skin:  no rashes, no hyperemia Neurological: no tremor with outstretched hands   Diabetic Foot Exam - Simple   No data filed      CMP ( most recent) CMP     Component Value Date/Time   NA 143 06/22/2023 1131   K 4.4 06/22/2023 1131   CL 103 06/22/2023 1131   CO2 22 06/22/2023 1131   GLUCOSE 186 (H) 06/22/2023 1131   GLUCOSE 158 (H) 05/07/2023 0844   BUN 19 07/07/2023 0000   CREATININE 0.9 07/07/2023 0000   CREATININE 0.92 06/22/2023 1131   CALCIUM 9.6 06/22/2023 1131   PROT 6.5 10/05/2018 1415   ALBUMIN 4.0 10/05/2018 1415   AST 17 10/05/2018 1415   ALT 15 10/05/2018 1415   ALKPHOS 129 (A) 07/07/2023 0000   BILITOT 0.6 10/05/2018 1415   GFR 60.63 06/29/2011 1111   EGFR 61 07/07/2023 0000   EGFR 63 06/22/2023 1131   GFRNONAA >60 05/07/2023 0844     Diabetic Labs (most recent): Lab Results  Component Value Date   HGBA1C 7.4 07/07/2023     Lipid Panel ( most recent) Lipid Panel     Component Value Date/Time   CHOL  03/18/2009 0548    88        ATP III CLASSIFICATION:  <200     mg/dL   Desirable  799-760  mg/dL   Borderline High  >=759    mg/dL   High          TRIG 92 07/07/2023 0000   HDL 21 (L) 03/18/2009 0548   CHOLHDL 4.2 03/18/2009 0548   VLDL 13 03/18/2009 0548   LDLCALC 73 07/07/2023 0000            Thyroid  US  from  06/19/22 CLINICAL DATA:  Prior ultrasound follow-up.   EXAM: THYROID  ULTRASOUND   TECHNIQUE: Ultrasound examination of the thyroid  gland and adjacent soft tissues was performed.   COMPARISON:  May 2020, multiple priors   FINDINGS: Parenchymal Echotexture: Moderately heterogenous   Isthmus: 0.3 cm   Right lobe: 7.1 x 4.2 x 4.4 cm   Left lobe: 4.0 x 2.0 x 1.0 cm   _________________________________________________________   Estimated  total number of nodules >/= 1 cm:   Number of spongiform nodules >/=  2 cm not described below (TR1): 0   Number of mixed cystic and solid nodules >/= 1.5 cm not described below (TR2): 0   _________________________________________________________   Nodule labeled 1 is a large predominantly solid isoechoic TR 3 occupying nearly the entirety of the right thyroid  lobe measuring 6.7 x 4.5 x 3.8 cm, previously measuring 6.6 cm in May 2020 and 3.9 cm in February 2011. It remains similar in size and morphology to most recent exam. **Given size (>/= 2.5 cm) and appearance, fine needle aspiration of this mildly suspicious nodule should be considered based on TI-RADS criteria.   A couple of additional benign cystic anechoic/spongiform appearing TR 1 nodules are identified in the left thyroid  lobe, none of which meet criteria for further dedicated follow-up or biopsy.   IMPRESSION: 1. Multinodular thyroid  gland. 2. Nodule labeled 1 (6.7 cm TR 3) occupies essentially the entirety of the right thyroid  lobe, and remains similar to most recent examination in May 2020. Of note, this nodule has slowly enlarged since February 2011 when it measured 3.9 cm. Correlate with any biopsy results if available. If no recent biopsy results, this nodule does meet criteria for biopsy if otherwise clinically appropriate. 3. Additional benign-appearing nodules in the left thyroid  lobe do not meet criteria for further dedicated follow-up or biopsy.   The above is in keeping with the ACR TI-RADS recommendations - J Am Coll Radiol 2017;14:587-595.     Electronically Signed   By: Felton Fry M.D.   On: 06/22/2022 13:29  ----------------------------------------------------------------------------------------------------- NM Uptake and scan from 08/16/23 CLINICAL DATA:  TSH equal 0.038. RIGHT-sided goiter. Hair loss. Fatigue   EXAM: THYROID  SCAN AND UPTAKE - 4 AND 24 HOURS   TECHNIQUE: Following oral  administration of I-123 capsule, anterior planar imaging was acquired at 24 hours. Thyroid  uptake was calculated with a thyroid  probe at 4-6 hours and 24 hours.   RADIOPHARMACEUTICALS:  Three hundred one uCi I-123 sodium iodide p.o.   COMPARISON:  Thyroid  ultrasound 06/19/2022   FINDINGS: Minimal uptake within the thyroid  gland. The thyroid  gland is poorly defined on imaging.   4 hour I-123 uptake = 1.8% (normal 5-20%)   24 hour I-123 uptake = 1.7% (normal 10-30%)   IMPRESSION: 1. Thyroid  gland poorly defined on imaging. 2. Very low iodine  uptake.     Electronically Signed   By: Jackquline Boxer M.D.   On: 08/16/2023 15:32    Latest Reference Range & Units 10/05/18 14:15 07/07/23 00:00 08/23/23 00:00  TSH 0.41 - 5.90   0.04 ! (E) 0.14 ! (E)  Thyroperoxidase Ab SerPl-aCnc 0 - 34 IU/mL <9    Thyroglobulin Antibody 0.0 - 0.9 IU/mL <1.0    !: Data is abnormal (E): External lab result  Assessment & Plan:   1) Abnormal thyroid  labs-subclinical hyperthyroidism 2) Thyroid  nodule  Recent labs for thyroid  were consistent with subclinical hyperthyroidism.  She has been taking a Biotin supplement for the last 3 months or so.  We did  discuss the potential for Biotin to cause inaccuracies with the blood testing for the thyroid  and thus I advised her to stop that for now.  When asking the patient if she had ever been on Amiodarone  in the past, she was unclear.  During chart review, I did see where she was on it briefly in December 2024 and stopped in January of 2025.  She did have thyroid  ultrasound showing moderate heterogenous tissue and a nodule that takes up nearly the entire right lobe TR3 which does meet criteria for biopsy.  She does not remember ever having a biopsy on this nodule.  She also reports the lump has been getting smaller.  Will recheck thyroid  ultrasound to assess the 6.7 cm nodule in the right lobe.  If it has significantly changed, may order biopsy.  She also had an  uptake ands can showing low uptake at 4 hr and 24 hr, which may represent acute inflammation.  Will check more comprehensive thyroid  labs, including antibody testing to assess for autoimmune dysfunction.  Will call patient with results and next steps.   - she is advised to maintain close follow up with Burdine, Steven E, MD for primary care needs, as well as her other providers for optimal and coordinated care.   - Time spent in this patient care: 60 min, which was spent in counseling her about her diabetes and the rest reviewing her blood glucose logs, discussing her hypoglycemia and hyperglycemia episodes, reviewing her current and previous labs/studies (including abstraction from other facilities) and medications doses and developing a long term treatment plan based on the latest standards of care/guidelines; and documenting her care.    Please refer to Patient Instructions for Blood Glucose Monitoring and Insulin /Medications Dosing Guide in media tab for additional information. Please also refer to Patient Self Inventory in the Media tab for reviewed elements of pertinent patient history.  [name] [name] participated in the discussions, expressed understanding, and voiced agreement with the above plans.  All questions were answered to her satisfaction. she is encouraged to contact clinic should she have any questions or concerns prior to her return visit.     Follow up plan: - Return labs today, thyroid  ultrasound soon, will call with results and next steps.    Benton Rio, [facility] [address] [place], [place] [address] Phone: [phone] Fax: [phone]  11/18/2023, 1:47 PM

## 2023-11-19 ENCOUNTER — Ambulatory Visit: Attending: Cardiovascular Disease | Admitting: Cardiovascular Disease

## 2023-11-19 ENCOUNTER — Encounter: Admitting: Cardiovascular Disease

## 2023-11-19 ENCOUNTER — Encounter: Payer: Self-pay | Admitting: Cardiovascular Disease

## 2023-11-19 ENCOUNTER — Ambulatory Visit: Payer: Self-pay | Admitting: Nurse Practitioner

## 2023-11-19 VITALS — BP 126/56 | HR 74 | Ht 63.0 in | Wt 141.4 lb

## 2023-11-19 DIAGNOSIS — I480 Paroxysmal atrial fibrillation: Secondary | ICD-10-CM

## 2023-11-19 DIAGNOSIS — I495 Sick sinus syndrome: Secondary | ICD-10-CM | POA: Diagnosis not present

## 2023-11-19 DIAGNOSIS — I483 Typical atrial flutter: Secondary | ICD-10-CM | POA: Diagnosis not present

## 2023-11-19 DIAGNOSIS — E0590 Thyrotoxicosis, unspecified without thyrotoxic crisis or storm: Secondary | ICD-10-CM

## 2023-11-19 LAB — THYROGLOBULIN ANTIBODY: Thyroglobulin Antibody: <1 [IU]/mL (ref 0.0–0.9)

## 2023-11-19 LAB — T4, FREE: Free T4: 2.48 ng/dL — ABNORMAL HIGH (ref 0.82–1.77)

## 2023-11-19 LAB — THYROID PEROXIDASE ANTIBODY: Thyroperoxidase Ab SerPl-aCnc: 25 [IU]/mL (ref 0–34)

## 2023-11-19 LAB — T3, FREE: T3, Free: 4.7 pg/mL — ABNORMAL HIGH (ref 2.0–4.4)

## 2023-11-19 LAB — TSH: TSH: <0.005 u[IU]/mL — ABNORMAL LOW (ref 0.450–4.500)

## 2023-11-19 NOTE — Progress Notes (Signed)
      HPI [name] returns today for followup. She is a pleasant 78 yo woman with symptomatic tachy-brady syndrome s/p PPM by Dr. MILUS over 11 years ago. She has rare palpitations. She has been placed on dofetilide  and her atrial fib has been fairly well controlled.   She underwent generator change by dr. Waddell in September, 2023.  Her atrial fibrillation has been managed with dofetilide .  She has continued to have episodes of atrial high rates detected by her device.  She does not have palpitations but she does complain of episodic fatigue.  Today in follow-up (April 23, 2023), she notes significant increased and fatigue.  This began in December and has been persistent.  Her quality of life and ability to perform ADLs has been affected.  She underwent DC cardioversion while on amiodarone  but had fairly rapid recurrence of atrial fibrillation.  She underwent ablation of fibrillation flutter on Jul 16, 2023.   BP (!) 126/56 (BP Location: Left Arm, Patient Position: Sitting, Cuff Size: Normal)   Pulse 74   Ht 5' 3 (1.6 m)   Wt 141 lb 6.4 oz (64.1 kg)   SpO2 98%   BMI 25.05 kg/m   Physical Exam:  Well appearing NAD Lungs: Normal work of breathing HEART:  Regular rate rhythm, no murmurs, no rubs, no clicks CHEST: The device site is normal -- no tenderness, edema, drainage, redness, threatened erosion. Ext:  2 plus pulses, no edema, no cyanosis, no clubbing   DEVICE  Normal device function.  See PaceArt for details.   EKG     Assess/Plan:   Sinus node dysfunction  s/p PPM insertion.  A-paced about 80% on last interrogation.   PAF   Failed Tikosyn  due to Qtc prolongation, failed amiodarone  -- now in flutter History of fibrillation and flutter --onset of persistent A-fib/flutter correlates with severe fatigue She underwent ablation Jul 16, 2023 for persistent atrial fibrillation and typical flutter. She has had some recurrence since, but her burden improved from about 60%  to 2.6%  Secondary hypercoagulable state Continue xarelto  20mg  Check CBC  CAD  she denies anginal symptoms.  HTN  her bp is well controlled.  No change in her meds.  Follow-up 2 months  Eulas FORBES Furbish, MD

## 2023-11-19 NOTE — Progress Notes (Signed)
 Patient was called and a voice message was left sharing that I would call her back to review the results.

## 2023-11-19 NOTE — Progress Notes (Signed)
 Please call patient and let her know that her thyroid  is pretty over-active at this time.  We are still waiting on one of her labs to come back (antibody testing), but based on the labs, we need to do something to slow her thyroid  down.  We did talk about this at her visit yesterday.  I do feel she could benefit from starting Methimazole 5 mg po twice daily.  Will need to repeat labs in about 6 weeks to see if she is responding to treatment or if she needs a dosage change.  If she is ok with this plan, we can send in a script and order the follow up labs.

## 2023-11-19 NOTE — Patient Instructions (Signed)
 Medication Instructions:  Continue all current medications.   Labwork: none  Testing/Procedures: none  Follow-Up: 6 months   Any Other Special Instructions Will Be Listed Below (If Applicable).   If you need a refill on your cardiac medications before your next appointment, please call your pharmacy.

## 2023-11-22 NOTE — Progress Notes (Signed)
 Patient was called and another message was left.

## 2023-11-22 NOTE — Progress Notes (Signed)
 Ok, let me know if she decides to do the Methimazole and I can send it in for her.

## 2023-11-23 ENCOUNTER — Ambulatory Visit (HOSPITAL_COMMUNITY)
Admission: RE | Admit: 2023-11-23 | Discharge: 2023-11-23 | Disposition: A | Discharge status: Home / Self Care | Source: Ambulatory Visit | Attending: Nurse Practitioner | Admitting: Nurse Practitioner

## 2023-11-23 DIAGNOSIS — E042 Nontoxic multinodular goiter: Secondary | ICD-10-CM | POA: Diagnosis not present

## 2023-11-23 DIAGNOSIS — R7989 Other specified abnormal findings of blood chemistry: Secondary | ICD-10-CM | POA: Diagnosis not present

## 2023-11-23 DIAGNOSIS — E041 Nontoxic single thyroid nodule: Secondary | ICD-10-CM | POA: Insufficient documentation

## 2023-11-23 NOTE — Progress Notes (Signed)
 Patient was called again using her home phone number. Another voice message was left.

## 2023-11-23 NOTE — Progress Notes (Signed)
 Called the patient as requested on her phone line. VM came on and unable to leave a message. Plan to call back later.

## 2023-11-23 NOTE — Progress Notes (Signed)
Patient was called again 

## 2023-11-24 MED ORDER — METHIMAZOLE 5 MG PO TABS: 5.0000 mg | ORAL_TABLET | Freq: Two times a day (BID) | ORAL | 1 refills | Status: AC

## 2023-11-24 NOTE — Progress Notes (Signed)
 Can you put her on the schedule for follow up in 8 weeks?  You can overbook.

## 2023-11-24 NOTE — Progress Notes (Signed)
 Talked with the patient. She states that she is willing to take the Methimazole , she uses [facility] in [place]. She also ask that we please call her and remind her when she is to get the lab work and her next office visit.

## 2023-12-06 ENCOUNTER — Other Ambulatory Visit: Payer: Self-pay | Admitting: Cardiology

## 2023-12-11 DIAGNOSIS — Z885 Allergy status to narcotic agent status: Secondary | ICD-10-CM | POA: Diagnosis not present

## 2023-12-11 DIAGNOSIS — I252 Old myocardial infarction: Secondary | ICD-10-CM | POA: Diagnosis not present

## 2023-12-11 DIAGNOSIS — Z950 Presence of cardiac pacemaker: Secondary | ICD-10-CM | POA: Diagnosis not present

## 2023-12-11 DIAGNOSIS — I110 Hypertensive heart disease with heart failure: Secondary | ICD-10-CM | POA: Diagnosis not present

## 2023-12-11 DIAGNOSIS — Z7985 Long-term (current) use of injectable non-insulin antidiabetic drugs: Secondary | ICD-10-CM | POA: Diagnosis not present

## 2023-12-11 DIAGNOSIS — Z888 Allergy status to other drugs, medicaments and biological substances status: Secondary | ICD-10-CM | POA: Diagnosis not present

## 2023-12-11 DIAGNOSIS — E7800 Pure hypercholesterolemia, unspecified: Secondary | ICD-10-CM | POA: Diagnosis not present

## 2023-12-11 DIAGNOSIS — E119 Type 2 diabetes mellitus without complications: Secondary | ICD-10-CM | POA: Diagnosis not present

## 2023-12-11 DIAGNOSIS — S0101XA Laceration without foreign body of scalp, initial encounter: Secondary | ICD-10-CM | POA: Diagnosis not present

## 2023-12-11 DIAGNOSIS — Z23 Encounter for immunization: Secondary | ICD-10-CM | POA: Diagnosis not present

## 2023-12-11 DIAGNOSIS — S098XXA Other specified injuries of head, initial encounter: Secondary | ICD-10-CM | POA: Diagnosis not present

## 2023-12-11 DIAGNOSIS — I509 Heart failure, unspecified: Secondary | ICD-10-CM | POA: Diagnosis not present

## 2023-12-11 DIAGNOSIS — M160 Bilateral primary osteoarthritis of hip: Secondary | ICD-10-CM | POA: Diagnosis not present

## 2023-12-11 DIAGNOSIS — I4891 Unspecified atrial fibrillation: Secondary | ICD-10-CM | POA: Diagnosis not present

## 2023-12-11 DIAGNOSIS — S20229A Contusion of unspecified back wall of thorax, initial encounter: Secondary | ICD-10-CM | POA: Diagnosis not present

## 2023-12-11 DIAGNOSIS — Z7901 Long term (current) use of anticoagulants: Secondary | ICD-10-CM | POA: Diagnosis not present

## 2023-12-11 DIAGNOSIS — S3983XA Other specified injuries of pelvis, initial encounter: Secondary | ICD-10-CM | POA: Diagnosis not present

## 2023-12-11 DIAGNOSIS — R918 Other nonspecific abnormal finding of lung field: Secondary | ICD-10-CM | POA: Diagnosis not present

## 2023-12-11 DIAGNOSIS — Z79899 Other long term (current) drug therapy: Secondary | ICD-10-CM | POA: Diagnosis not present

## 2023-12-11 DIAGNOSIS — Z043 Encounter for examination and observation following other accident: Secondary | ICD-10-CM | POA: Diagnosis not present

## 2023-12-13 ENCOUNTER — Other Ambulatory Visit: Payer: Self-pay | Admitting: Cardiology

## 2023-12-21 LAB — LAB REPORT - SCANNED: TSH: 0.022

## 2023-12-23 ENCOUNTER — Telehealth: Payer: Self-pay | Admitting: *Deleted

## 2023-12-23 NOTE — Telephone Encounter (Signed)
 Patient left a message stating that her provider, Izetta Raymond was going to send information about her medicine to our office. I have reviewed with Whitney. She shares the following. I just pulled her up and it looks like a TSH level was sent to media. but they did not check Free T4 or Free T3 so I am going to need her to have it redrawn.  I put her on Methimazole 5 mg twice daily after the initial results came back high, but she wasnt supposed to do labs again until 8 weeks (around 12/3) Patient was called and made aware. We will reach out to [facility] to see what Ms. Skillman was wanting.

## 2023-12-28 ENCOUNTER — Telehealth: Payer: Self-pay | Admitting: Nurse Practitioner

## 2023-12-28 NOTE — Telephone Encounter (Signed)
 Opened to send note, but addended phone note from [facility] instead

## 2023-12-28 NOTE — Telephone Encounter (Signed)
 Not sure, I guess we will just plan to follow up with patient here next month.  Perhaps the fax will prompt them to communicate with us .

## 2023-12-28 NOTE — Telephone Encounter (Signed)
 Noted

## 2023-12-28 NOTE — Telephone Encounter (Signed)
 Faxed [facility] to try to get more information about what is needed and tried to call but couldn't get anyone.  Not sure what next steps should be.

## 2023-12-29 ENCOUNTER — Telehealth: Payer: Self-pay | Admitting: *Deleted

## 2023-12-29 NOTE — Telephone Encounter (Signed)
 Talked with the patient. Shared with her , her appointment on 01/31/2024 @ 11:30 am. She was also told that she was to have lab work 1 week prior to her visit at [facility]. This would be December 8th. Patient verbalized understanding. We are mailing her a copy of her lab orders.

## 2024-01-03 ENCOUNTER — Telehealth: Payer: Self-pay | Admitting: *Deleted

## 2024-01-03 NOTE — Telephone Encounter (Signed)
 Patient left a message asking if she the paperwork that had [employer] on it, she would need to take that to the lab with her. I have left a message on her home phone that she would need to take that with her , and she would need to go to the [facility] one week before her appointment with Whitney.

## 2024-01-21 ENCOUNTER — Ambulatory Visit: Payer: Self-pay

## 2024-01-21 DIAGNOSIS — I480 Paroxysmal atrial fibrillation: Secondary | ICD-10-CM

## 2024-01-24 ENCOUNTER — Telehealth: Payer: Self-pay | Admitting: *Deleted

## 2024-01-24 LAB — CUP PACEART REMOTE DEVICE CHECK
Battery Remaining Longevity: 137 mo
Battery Voltage: 3.03 V
Brady Statistic AP VP Percent: 4.74 %
Brady Statistic AP VS Percent: 76.4 %
Brady Statistic AS VP Percent: 0.83 %
Brady Statistic AS VS Percent: 18.03 %
Brady Statistic RA Percent Paced: 77.49 %
Brady Statistic RV Percent Paced: 6.79 %
Date Time Interrogation Session: 20251205032745
Implantable Lead Connection Status: 753985
Implantable Lead Connection Status: 753985
Implantable Lead Implant Date: 20110510
Implantable Lead Implant Date: 20110510
Implantable Lead Location: 753859
Implantable Lead Location: 753860
Implantable Lead Model: 5076
Implantable Lead Model: 5092
Implantable Pulse Generator Implant Date: 20230901
Lead Channel Impedance Value: 361 Ohm
Lead Channel Impedance Value: 361 Ohm
Lead Channel Impedance Value: 418 Ohm
Lead Channel Impedance Value: 456 Ohm
Lead Channel Pacing Threshold Amplitude: 0.625 V
Lead Channel Pacing Threshold Amplitude: 1.25 V
Lead Channel Pacing Threshold Pulse Width: 0.4 ms
Lead Channel Pacing Threshold Pulse Width: 0.4 ms
Lead Channel Sensing Intrinsic Amplitude: 1.25 mV
Lead Channel Sensing Intrinsic Amplitude: 1.25 mV
Lead Channel Sensing Intrinsic Amplitude: 4 mV
Lead Channel Sensing Intrinsic Amplitude: 4 mV
Lead Channel Setting Pacing Amplitude: 1.5 V
Lead Channel Setting Pacing Amplitude: 2.5 V
Lead Channel Setting Pacing Pulse Width: 0.4 ms
Lead Channel Setting Sensing Sensitivity: 0.9 mV
Zone Setting Status: 755011
Zone Setting Status: 755011

## 2024-01-24 NOTE — Telephone Encounter (Signed)
 Patient left a message that she was to go get her lab work drawn today but due to the weather she cannot go. She is very concerned that she may fall. She is asking if she can go Thursday or Friday. Her office appointment is 12*/18/2025. Patient was called back and told that she may got Wednesday or Thursday this week to get her lab work done, that this should be okay.

## 2024-01-24 NOTE — Telephone Encounter (Signed)
 Documentation on another note.

## 2024-01-25 NOTE — Progress Notes (Signed)
 Remote PPM Transmission

## 2024-01-28 LAB — T4, FREE: Free T4: 1.08 ng/dL (ref 0.82–1.77)

## 2024-01-28 LAB — TSH: TSH: 1.21 u[IU]/mL (ref 0.450–4.500)

## 2024-01-28 LAB — T3, FREE: T3, Free: 2.9 pg/mL (ref 2.0–4.4)

## 2024-01-31 ENCOUNTER — Ambulatory Visit: Admitting: Nurse Practitioner

## 2024-01-31 ENCOUNTER — Encounter: Payer: Self-pay | Admitting: Nurse Practitioner

## 2024-01-31 VITALS — BP 134/83 | HR 107 | Ht 63.0 in | Wt 138.2 lb

## 2024-01-31 DIAGNOSIS — E0590 Thyrotoxicosis, unspecified without thyrotoxic crisis or storm: Secondary | ICD-10-CM | POA: Diagnosis not present

## 2024-01-31 DIAGNOSIS — E782 Mixed hyperlipidemia: Secondary | ICD-10-CM

## 2024-01-31 DIAGNOSIS — Z7985 Long-term (current) use of injectable non-insulin antidiabetic drugs: Secondary | ICD-10-CM

## 2024-01-31 DIAGNOSIS — I10 Essential (primary) hypertension: Secondary | ICD-10-CM

## 2024-01-31 DIAGNOSIS — E041 Nontoxic single thyroid nodule: Secondary | ICD-10-CM | POA: Diagnosis not present

## 2024-01-31 DIAGNOSIS — E119 Type 2 diabetes mellitus without complications: Secondary | ICD-10-CM | POA: Diagnosis not present

## 2024-01-31 DIAGNOSIS — Z7984 Long term (current) use of oral hypoglycemic drugs: Secondary | ICD-10-CM

## 2024-01-31 DIAGNOSIS — R7989 Other specified abnormal findings of blood chemistry: Secondary | ICD-10-CM | POA: Diagnosis not present

## 2024-01-31 NOTE — Progress Notes (Signed)
 Endocrinology Follow Up Note       01/31/2024, 11:24 AM   Subjective:    Patient ID: [name] [name], female    DOB: 1945-11-09.  [name] [name] is being seen in follow up after being seen in consultation for  abnormal thyroid  labs and imaging requested by  Lari Elspeth BRAVO, MD.   Past Medical History:  Diagnosis Date   Atrial fibrillation [facility])    Atrial flutter (HCC)    Coronary atherosclerosis of native coronary artery    BMS to circumflex 9/08   HTN (hypertension)    Hyperlipidemia    Multiple thyroid  nodules    PONV (postoperative nausea and vomiting)    Tachy-brady syndrome (HCC)    PPM - Medtronic   Type 2 diabetes mellitus (HCC)    Urticaria     Past Surgical History:  Procedure Laterality Date   ATRIAL FIBRILLATION ABLATION N/A 07/16/2023   Procedure: ATRIAL FIBRILLATION ABLATION;  Surgeon: Nancey Eulas BRAVO, MD;  Location: [facility] INVASIVE CV LAB;  Service: Cardiovascular;  Laterality: N/A;   BREAST LUMPECTOMY     CARDIOVERSION N/A 05/07/2023   Procedure: CARDIOVERSION;  Surgeon: Debera Jayson MATSU, MD;  Location: [facility] OR[facility];  Service: Cardiovascular;  Laterality: N/A;  pt notified to arrive at 0830 for 1030 procedure. Voiced understanding.   COLONOSCOPY  2014    Last colonoscopy 2014 by Dr. Donnel. Internal hemorrhoids, sigmoid diverticulosis, multiple sessile polyps, tubular adenomas.    PACEMAKER PLACEMENT     Medtronic   PPM GENERATOR CHANGEOUT N/A 10/17/2021   Procedure: PPM GENERATOR CHANGEOUT;  Surgeon: Waddell Danelle ORN, MD;  Location: [facility] INVASIVE CV LAB;  Service: Cardiovascular;  Laterality: N/A;   ROTATOR CUFF REPAIR     VENTRAL HERNIA REPAIR     VESICOVAGINAL FISTULA CLOSURE W/ TAH      Social History   Socioeconomic History   Marital status: Married    Spouse name: Not on file   Number of children: Not on file   Years of education: Not on file   Highest education level: Not on file   Occupational History   Occupation: retired    Comment: [employer]  Tobacco Use   Smoking status: Never    Passive exposure: Never   Smokeless tobacco: Never  Vaping Use   Vaping status: Never Used  Substance and Sexual Activity   Alcohol use: Never    Alcohol/week: 0.0 standard drinks of alcohol   Drug use: Never   Sexual activity: Not on file  Other Topics Concern   Not on file  Social History Narrative   Not on file   Social Drivers of Health   Tobacco Use: Low Risk (01/31/2024)   Patient History    Smoking Tobacco Use: Never    Smokeless Tobacco Use: Never    Passive Exposure: Never  Financial Resource Strain: Not on file  Food Insecurity: Not on file  Transportation Needs: Not on file  Physical Activity: Not on file  Stress: Not on file  Social Connections: Not on file  Depression (EYV7-0): Not on file  Alcohol Screen: Not on file  Housing: Not on file  Utilities: Not on file  Health Literacy: Not  on file    Family History  Problem Relation Age of Onset   Heart disease Father    Heart disease Other        one sibling   Colon cancer Neg Hx    Colon polyps Neg Hx     Outpatient Encounter Medications as of 01/31/2024  Medication Sig   Cholecalciferol (D-3-5) 125 MCG (5000 UT) capsule Take 5,000 Units by mouth at bedtime.   cyanocobalamin  (VITAMIN B12) 1000 MCG tablet Take 1,000 mcg by mouth in the morning.   diltiazem  (DILT-XR) 120 MG 24 hr capsule TAKE 2 CAPSULES BY MOUTH DAILY  IN THE MORNING   furosemide  (LASIX ) 20 MG tablet Take 3 tablets (60 mg total) by mouth daily.   JARDIANCE 25 MG TABS tablet Take 25 mg by mouth daily.   losartan  (COZAAR ) 50 MG tablet Take 50 mg by mouth daily.   methimazole  (TAPAZOLE ) 5 MG tablet Take 1 tablet (5 mg total) by mouth 2 (two) times daily.   MOUNJARO 10 MG/0.5ML Pen Inject 10 mg into the skin once a week.   Multiple Vitamin (MULTIVITAMIN) tablet Take 1 tablet by mouth daily. NATURE BOUNTY- FOR HAIR GROWTH    nitroGLYCERIN  (NITROSTAT ) 0.4 MG SL tablet Place 1 tablet (0.4 mg total) under the tongue every 5 (five) minutes x 3 doses as needed for chest pain (if no relief after 3 rd dose, proceed to ED for an evaluation).   potassium chloride  SA (KLOR-CON  M) 20 MEQ tablet Take 1 tablet (20 mEq total) by mouth daily.   pravastatin  (PRAVACHOL ) 40 MG tablet TAKE 1 TABLET BY MOUTH IN THE  EVENING   XARELTO  20 MG TABS tablet TAKE 1 TABLET BY MOUTH IN THE  EVENING   [DISCONTINUED] OVER THE COUNTER MEDICATION Take 1 tablet by mouth daily. Patient is taking Nature's Bounty Hair Growth   No facility-administered encounter medications on file as of 01/31/2024.    ALLERGIES: Allergies  Allergen Reactions   Benzonatate Cough   Codeine Nausea And Vomiting   Doxycycline Itching   Lisinopril Cough   Metformin And Related Rash    VACCINATION STATUS: Immunization History  Administered Date(s) Administered   Fluad Quad(high Dose 65+) 11/22/2020   INFLUENZA, HIGH DOSE SEASONAL PF 11/26/2016   Influenza,inj,Quad PF,6+ Mos 11/18/2018   Moderna Sars-Covid-2 Vaccination 06/24/2020   Tdap 10/27/2017   Zoster Recombinant(Shingrix) 07/05/2018, 10/18/2018    Thyroid  Problem Presents for follow-up visit. The condition has lasted for 5 months. Symptoms include fatigue. Patient reports no palpitations (hx afib with pacemaker). The symptoms have been improving. Past treatments include nothing. Prior procedures include radioiodine uptake scan and thyroid  ultrasound. Her past medical history is significant for atrial fibrillation, diabetes and hyperlipidemia. Risk factors include amiodarone  use (She notes sister had thyroidectomy but unsure of the reason).     Review of systems  Constitutional: + Minimally fluctuating body weight, current Body mass index is 24.48 kg/m., + fatigue, no subjective hyperthermia, no subjective hypothermia Eyes: no blurry vision, no xerophthalmia ENT: no sore throat, + nodules palpated in  throat-right side-improving, + dysphagia with food and drink-improving, no hoarseness Cardiovascular: no chest pain, no shortness of breath, no palpitations, no leg swelling Respiratory: no cough, no shortness of breath Gastrointestinal: no nausea/vomiting/diarrhea Musculoskeletal: no muscle/joint aches Skin: no rashes, no hyperemia Neurological: no tremors, no numbness, no tingling, no dizziness Psychiatric: no depression, no anxiety  Objective:     BP 134/83   Pulse (!) 107   Ht 5' 3 (1.6 m)  Wt 138 lb 3.2 oz (62.7 kg)   SpO2 97%   BMI 24.48 kg/m   Wt Readings from Last 3 Encounters:  01/31/24 138 lb 3.2 oz (62.7 kg)  11/19/23 141 lb 6.4 oz (64.1 kg)  11/18/23 142 lb (64.4 kg)     BP Readings from Last 3 Encounters:  01/31/24 134/83  11/19/23 (!) 126/56  11/18/23 122/60     Physical Exam- Limited  Constitutional:  Body mass index is 24.48 kg/m. , not in acute distress, normal state of mind Eyes:  EOMI, no exophthalmos Musculoskeletal: no gross deformities, strength intact in all four extremities, no gross restriction of joint movements Skin:  no rashes, no hyperemia Neurological: no tremor with outstretched hands   Diabetic Foot Exam - Simple   No data filed      CMP ( most recent) CMP     Component Value Date/Time   NA 143 06/22/2023 1131   K 4.4 06/22/2023 1131   CL 103 06/22/2023 1131   CO2 22 06/22/2023 1131   GLUCOSE 186 (H) 06/22/2023 1131   GLUCOSE 158 (H) 05/07/2023 0844   BUN 19 07/07/2023 0000   CREATININE 0.9 07/07/2023 0000   CREATININE 0.92 06/22/2023 1131   CALCIUM 9.6 06/22/2023 1131   PROT 6.5 10/05/2018 1415   ALBUMIN 4.0 10/05/2018 1415   AST 17 10/05/2018 1415   ALT 15 10/05/2018 1415   ALKPHOS 129 (A) 07/07/2023 0000   BILITOT 0.6 10/05/2018 1415   GFR 60.63 06/29/2011 1111   EGFR 61 07/07/2023 0000   EGFR 63 06/22/2023 1131   GFRNONAA >60 05/07/2023 0844     Diabetic Labs (most recent): Lab Results  Component Value  Date   HGBA1C 7.4 07/07/2023     Lipid Panel ( most recent) Lipid Panel     Component Value Date/Time   CHOL  03/18/2009 0548    88        ATP III CLASSIFICATION:  <200     mg/dL   Desirable  799-760  mg/dL   Borderline High  >=759    mg/dL   High          TRIG 92 07/07/2023 0000   HDL 21 (L) 03/18/2009 0548   CHOLHDL 4.2 03/18/2009 0548   VLDL 13 03/18/2009 0548   LDLCALC 73 07/07/2023 0000            Thyroid  US  from 06/19/22 CLINICAL DATA:  Prior ultrasound follow-up.   EXAM: THYROID  ULTRASOUND   TECHNIQUE: Ultrasound examination of the thyroid  gland and adjacent soft tissues was performed.   COMPARISON:  May 2020, multiple priors   FINDINGS: Parenchymal Echotexture: Moderately heterogenous   Isthmus: 0.3 cm   Right lobe: 7.1 x 4.2 x 4.4 cm   Left lobe: 4.0 x 2.0 x 1.0 cm   _________________________________________________________   Estimated total number of nodules >/= 1 cm:   Number of spongiform nodules >/=  2 cm not described below (TR1): 0   Number of mixed cystic and solid nodules >/= 1.5 cm not described below (TR2): 0   _________________________________________________________   Nodule labeled 1 is a large predominantly solid isoechoic TR 3 occupying nearly the entirety of the right thyroid  lobe measuring 6.7 x 4.5 x 3.8 cm, previously measuring 6.6 cm in May 2020 and 3.9 cm in February 2011. It remains similar in size and morphology to most recent exam. **Given size (>/= 2.5 cm) and appearance, fine needle aspiration of this mildly suspicious nodule should be considered based  on TI-RADS criteria.   A couple of additional benign cystic anechoic/spongiform appearing TR 1 nodules are identified in the left thyroid  lobe, none of which meet criteria for further dedicated follow-up or biopsy.   IMPRESSION: 1. Multinodular thyroid  gland. 2. Nodule labeled 1 (6.7 cm TR 3) occupies essentially the entirety of the right thyroid  lobe, and remains  similar to most recent examination in May 2020. Of note, this nodule has slowly enlarged since February 2011 when it measured 3.9 cm. Correlate with any biopsy results if available. If no recent biopsy results, this nodule does meet criteria for biopsy if otherwise clinically appropriate. 3. Additional benign-appearing nodules in the left thyroid  lobe do not meet criteria for further dedicated follow-up or biopsy.   The above is in keeping with the ACR TI-RADS recommendations - J Am Coll Radiol 2017;14:587-595.     Electronically Signed   By: Felton Fry M.D.   On: 06/22/2022 13:29  ----------------------------------------------------------------------------------------------------- NM Uptake and scan from 08/16/23 CLINICAL DATA:  TSH equal 0.038. RIGHT-sided goiter. Hair loss. Fatigue   EXAM: THYROID  SCAN AND UPTAKE - 4 AND 24 HOURS   TECHNIQUE: Following oral administration of I-123 capsule, anterior planar imaging was acquired at 24 hours. Thyroid  uptake was calculated with a thyroid  probe at 4-6 hours and 24 hours.   RADIOPHARMACEUTICALS:  Three hundred one uCi I-123 sodium iodide p.o.   COMPARISON:  Thyroid  ultrasound 06/19/2022   FINDINGS: Minimal uptake within the thyroid  gland. The thyroid  gland is poorly defined on imaging.   4 hour I-123 uptake = 1.8% (normal 5-20%)   24 hour I-123 uptake = 1.7% (normal 10-30%)   IMPRESSION: 1. Thyroid  gland poorly defined on imaging. 2. Very low iodine  uptake.     Electronically Signed   By: Jackquline Boxer M.D.   On: 08/16/2023 15:32   Thyroid  ultrasound from 11/23/23 CLINICAL DATA:  Goiter. History of thyroid  nodules including a right inferior thyroid  nodule which was previously biopsied in 2012 and again in 2018.   EXAM: THYROID  ULTRASOUND   TECHNIQUE: Ultrasound examination of the thyroid  gland and adjacent soft tissues was performed.   COMPARISON:  Most recent prior thyroid  ultrasound 06/19/2022    FINDINGS: Parenchymal Echotexture: Markedly heterogenous   Isthmus: 0.4 cm   Right lobe: 6.7 x 3.1 x 4.4 cm   Left lobe: 3.8 x 1.6 x 0.9 cm   _________________________________________________________   Estimated total number of nodules >/= 1 cm: 4   Number of spongiform nodules >/=  2 cm not described below (TR1): 0   Number of mixed cystic and solid nodules >/= 1.5 cm not described below (TR2): 0   _________________________________________________________   Nodule # 1: The previously biopsied nodule occupying the majority of the right gland is similar to slightly decreased in size at 6.5 x 4.4 x 3.6 cm.   Nodules # 2, 3, 4 and 5: Scattered cystic and spongiform nodules in the medial right mid gland and scattered throughout the left mid and lower gland are again noted. These lesions are all sonographically low risk/benign and do not warrant further evaluation.   IMPRESSION: Slightly decreased size of the previously biopsied nodule occupying the majority of the right mid gland. Decreasing size over time is consistent with a benign process.   Additional scattered sonographically low risk/benign thyroid  cysts and nodules again noted. None meet criteria for further evaluation.   The above is in keeping with the ACR TI-RADS recommendations - J Am Coll Radiol 2017;14:587-595.  Electronically Signed   By: Wilkie Lent M.D.   On: 11/25/2023 13:41    Latest Reference Range & Units 07/07/23 00:00 08/23/23 00:00 11/18/23 13:50 12/20/23 12:30 01/27/24 13:22  TSH 0.450 - 4.500 uIU/mL 0.04 ! (E) 0.14 ! (E) <0.005 (L) 0.022 1.210  Triiodothyronine,Free,Serum 2.0 - 4.4 pg/mL   4.7 (H)  2.9  T4,Free(Direct) 0.82 - 1.77 ng/dL   7.51 (H)  8.91  Thyroperoxidase Ab SerPl-aCnc 0 - 34 IU/mL   25    Thyroglobulin Antibody 0.0 - 0.9 IU/mL   <1.0    !: Data is abnormal (L): Data is abnormally low (H): Data is abnormally high (E): External lab result  Assessment & Plan:   1)  Abnormal thyroid  labs-subclinical hyperthyroidism 2) Thyroid  nodule  Recent labs for thyroid  were consistent with subclinical hyperthyroidism.  She has been taking a Biotin supplement for the last 3 months or so.  We did discuss the potential for Biotin to cause inaccuracies with the blood testing for the thyroid  and thus I advised her to stop that for now.  When asking the patient if she had ever been on Amiodarone  in the past, she was unclear.  During chart review, I did see where she was on it briefly in December 2024 and stopped in January of 2025.  She did have thyroid  ultrasound showing moderate heterogenous tissue and a nodule that takes up nearly the entire right lobe TR3 which does meet criteria for biopsy.  She does not remember ever having a biopsy on this nodule.  She also reports the lump has been getting smaller.  Will recheck thyroid  ultrasound to assess the 6.7 cm nodule in the right lobe.  This nodule has gotten smaller, consistent with benignity.  She will not need additional imaging of her thyroid  at this time.  She also had an uptake ands can showing low uptake at 4 hr and 24 hr, which may represent acute inflammation.  She was started on Methimazole  5 mg twice daily between visits.  She has responded well to this treatment, repeat labs showing euthyroid presentation.  Will keep her on this dose for a bit longer.  Will repeat labs in 4 months and follow up in office to discuss next steps.   - she is advised to maintain close follow up with Burdine, Steven E, MD for primary care needs, as well as her other providers for optimal and coordinated care.    I spent  18  minutes in the care of the patient today including review of labs from Thyroid  Function, CMP, and other relevant labs ; imaging/biopsy records (current and previous including abstractions from other facilities); face-to-face time discussing  her lab results and symptoms, medications doses, her options of short and long  term treatment based on the latest standards of care / guidelines;   and documenting the encounter.  [name]  participated in the discussions, expressed understanding, and voiced agreement with the above plans.  All questions were answered to her satisfaction. she is encouraged to contact clinic should she have any questions or concerns prior to her return visit.     Follow up plan: - Return in about 4 months (around 05/31/2024) for Thyroid  follow up, Previsit labs.    Benton Rio, [facility] [address] [place], [place] [address] Phone: [phone] Fax: [phone]  01/31/2024, 11:24 AM

## 2024-02-03 ENCOUNTER — Ambulatory Visit: Payer: Self-pay | Admitting: Cardiovascular Disease

## 2024-02-15 ENCOUNTER — Other Ambulatory Visit: Payer: Self-pay | Admitting: Nurse Practitioner

## 2024-03-07 ENCOUNTER — Other Ambulatory Visit: Payer: Self-pay | Admitting: Nurse Practitioner

## 2024-03-08 ENCOUNTER — Telehealth: Payer: Self-pay | Admitting: *Deleted

## 2024-03-08 ENCOUNTER — Encounter: Payer: Self-pay | Admitting: Cardiology

## 2024-03-08 ENCOUNTER — Ambulatory Visit: Attending: Cardiology | Admitting: Cardiology

## 2024-03-08 VITALS — BP 122/62 | HR 71 | Ht 63.0 in | Wt 138.0 lb

## 2024-03-08 DIAGNOSIS — I495 Sick sinus syndrome: Secondary | ICD-10-CM

## 2024-03-08 DIAGNOSIS — I25119 Atherosclerotic heart disease of native coronary artery with unspecified angina pectoris: Secondary | ICD-10-CM | POA: Diagnosis not present

## 2024-03-08 DIAGNOSIS — R600 Localized edema: Secondary | ICD-10-CM | POA: Diagnosis not present

## 2024-03-08 DIAGNOSIS — I10 Essential (primary) hypertension: Secondary | ICD-10-CM

## 2024-03-08 DIAGNOSIS — I480 Paroxysmal atrial fibrillation: Secondary | ICD-10-CM | POA: Diagnosis not present

## 2024-03-08 DIAGNOSIS — Z79899 Other long term (current) drug therapy: Secondary | ICD-10-CM

## 2024-03-08 NOTE — Patient Instructions (Addendum)

## 2024-03-08 NOTE — Telephone Encounter (Signed)
 Per MDowell-We need to go ahead and check a BMET to make sure Xarelto  dose is correct

## 2024-03-08 NOTE — Telephone Encounter (Signed)
 Patient informed and verbalized understanding of plan.

## 2024-03-08 NOTE — Progress Notes (Signed)
"  ° ° °  Cardiology Office Note  Date: 03/08/2024   ID: [name] [name], DOB [birth year], MRN [id number]  History of Present Illness: [name] [name] is an 79 y.o. female last seen in July 2025.  She is here for a routine visit.  States that she has been doing reasonably well, no palpitations, no angina, good control of peripheral edema.  Her weight is stable.  Medtronic pacemaker in place with follow-up by Dr. Nancey.  She had an atrial fibrillation ablation in May 2025.  Device interrogation in December 2025 revealed normal function with approximately 3% AF burden.  We went over her medications.  She reports compliance with current therapy, no spontaneous bleeding problems on Xarelto .  Blood pressure is well-controlled today.  I reviewed her interval lab work.  Physical Exam: VS:  BP 122/62 (BP Location: Left Arm)   Pulse 71   Ht 5' 3 (1.6 m)   Wt 138 lb (62.6 kg)   SpO2 98%   BMI 24.45 kg/m , BMI Body mass index is 24.45 kg/m.  Wt Readings from Last 3 Encounters:  03/08/24 138 lb (62.6 kg)  01/31/24 138 lb 3.2 oz (62.7 kg)  11/19/23 141 lb 6.4 oz (64.1 kg)    General: Patient appears comfortable at rest. HEENT: Conjunctiva and lids normal. Neck: Supple, no elevated JVP or carotid bruits. Lungs: Clear to auscultation, nonlabored breathing at rest. Cardiac: Regular rate and rhythm, no S3 or significant systolic murmur. Extremities: No pitting edema.  ECG:  An ECG dated 08/16/2023 was personally reviewed today and demonstrated:  Atrial pacing with PACs and nonspecific ST-T changes.  Labwork: May 2025: Cholesterol 119, triglycerides 92, HDL 28, LDL 73 06/22/2023: Hemoglobin 17.5; Platelets 224; Potassium 4.4; Sodium 143 07/07/2023: BUN 19; Creatinine 0.9 01/27/2024: TSH 1.210     Component Value Date/Time   CHOL  03/18/2009 0548    88        ATP III CLASSIFICATION:  <200     mg/dL   Desirable  799-760  mg/dL   Borderline High  >=759    mg/dL   High          TRIG 92 07/07/2023 0000    HDL 21 (L) 03/18/2009 0548   CHOLHDL 4.2 03/18/2009 0548   VLDL 13 03/18/2009 0548   LDLCALC 73 07/07/2023 0000  December 2025: TSH 1.21  Other Studies Reviewed Today:  No interval cardiac testing for review today.  Assessment and Plan:  1. Paroxysmal atrial fibrillation with CHA2DS2-VASc score of 6.  She underwent atrial fibrillation ablation with Dr. Nancey in May 2025.  Symptomatically doing well at this point, approximately 12% AF burden based on device interrogation.  Continue Xarelto  20 mg daily for stroke prophylaxis, need to follow-up BMET.  She is also on Cardizem  CD 240 mg daily.   2. 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 Nancey.   3. CAD status post BMS to the circumflex in 2008.  No active angina.  She is not on aspirin given concurrent use of Xarelto .  Continue Jardiance 25 mg daily, Pravachol  40 mg daily, and as needed nitroglycerin .  Last LDL 73 in May 2025.   4.  Primary hypertension.  No changes made to current regimen which also includes Cozaar  50 mg daily.   5.  Intermittent ankle edema, well-controlled on current diuretic regimen.  No changes made..  Disposition:  Follow up 6 months.  Signed, Jayson JUDITHANN Sierras, M.D., F.A.C.C. [facility]

## 2024-03-11 LAB — BASIC METABOLIC PANEL WITH GFR
BUN/Creatinine Ratio: 24 (ref 12–28)
BUN: 21 mg/dL (ref 8–27)
CO2: 22 mmol/L (ref 20–29)
Calcium: 9.5 mg/dL (ref 8.7–10.3)
Chloride: 107 mmol/L — ABNORMAL HIGH (ref 96–106)
Creatinine, Ser: 0.89 mg/dL (ref 0.57–1.00)
Glucose: 121 mg/dL — ABNORMAL HIGH (ref 70–99)
Potassium: 4 mmol/L (ref 3.5–5.2)
Sodium: 145 mmol/L — ABNORMAL HIGH (ref 134–144)
eGFR: 65 mL/min/{1.73_m2}

## 2024-03-13 ENCOUNTER — Ambulatory Visit: Payer: Self-pay | Admitting: Cardiology

## 2024-03-16 ENCOUNTER — Other Ambulatory Visit: Payer: Self-pay | Admitting: Cardiology

## 2024-03-17 MED ORDER — RIVAROXABAN 15 MG PO TABS: 15.0000 mg | ORAL_TABLET | Freq: Every day | ORAL | 1 refills | Status: AC

## 2024-03-21 ENCOUNTER — Telehealth: Payer: Self-pay | Admitting: Cardiology

## 2024-03-21 NOTE — Telephone Encounter (Signed)
" °  Per answering service, patient returned call to office regarding results "

## 2024-03-21 NOTE — Telephone Encounter (Signed)
 Message left on vm on 03/14/2024. Results discussed with patient already.

## 2024-03-22 ENCOUNTER — Other Ambulatory Visit: Payer: Self-pay | Admitting: Cardiology

## 2024-04-21 ENCOUNTER — Ambulatory Visit: Payer: Self-pay

## 2024-05-19 ENCOUNTER — Ambulatory Visit: Admitting: Cardiovascular Disease

## 2024-06-05 ENCOUNTER — Ambulatory Visit: Admitting: Nurse Practitioner
